# Patient Record
Sex: Male | Born: 1935
Health system: Southern US, Community
[De-identification: ages and names within clinical notes are randomized; demographics above are authoritative.]

## PROBLEM LIST (undated history)

## (undated) DIAGNOSIS — Z87820 Personal history of traumatic brain injury: Secondary | ICD-10-CM

## (undated) DIAGNOSIS — Z87898 Personal history of other specified conditions: Secondary | ICD-10-CM

## (undated) DIAGNOSIS — G9341 Metabolic encephalopathy: Secondary | ICD-10-CM

## (undated) DIAGNOSIS — N401 Enlarged prostate with lower urinary tract symptoms: Secondary | ICD-10-CM

## (undated) DIAGNOSIS — Z87438 Personal history of other diseases of male genital organs: Secondary | ICD-10-CM

## (undated) DIAGNOSIS — N400 Enlarged prostate without lower urinary tract symptoms: Secondary | ICD-10-CM

## (undated) DIAGNOSIS — R339 Retention of urine, unspecified: Secondary | ICD-10-CM

## (undated) DIAGNOSIS — E785 Hyperlipidemia, unspecified: Secondary | ICD-10-CM

## (undated) DIAGNOSIS — E119 Type 2 diabetes mellitus without complications: Secondary | ICD-10-CM

## (undated) DIAGNOSIS — Z8619 Personal history of other infectious and parasitic diseases: Secondary | ICD-10-CM

## (undated) DIAGNOSIS — N138 Other obstructive and reflux uropathy: Secondary | ICD-10-CM

## (undated) DIAGNOSIS — K219 Gastro-esophageal reflux disease without esophagitis: Secondary | ICD-10-CM

## (undated) DIAGNOSIS — G919 Hydrocephalus, unspecified: Secondary | ICD-10-CM

## (undated) DIAGNOSIS — I10 Essential (primary) hypertension: Secondary | ICD-10-CM

## (undated) DIAGNOSIS — M8580 Other specified disorders of bone density and structure, unspecified site: Secondary | ICD-10-CM

## (undated) DIAGNOSIS — R42 Dizziness and giddiness: Secondary | ICD-10-CM

## (undated) DIAGNOSIS — D099 Carcinoma in situ, unspecified: Secondary | ICD-10-CM

## (undated) DIAGNOSIS — E559 Vitamin D deficiency, unspecified: Secondary | ICD-10-CM

## (undated) DIAGNOSIS — Z960 Presence of urogenital implants: Secondary | ICD-10-CM

## (undated) DIAGNOSIS — Z972 Presence of dental prosthetic device (complete) (partial): Secondary | ICD-10-CM

## (undated) DIAGNOSIS — R413 Other amnesia: Secondary | ICD-10-CM

## (undated) DIAGNOSIS — M1990 Unspecified osteoarthritis, unspecified site: Secondary | ICD-10-CM

## (undated) DIAGNOSIS — C61 Malignant neoplasm of prostate: Secondary | ICD-10-CM

## (undated) DIAGNOSIS — R269 Unspecified abnormalities of gait and mobility: Secondary | ICD-10-CM

## (undated) DIAGNOSIS — H269 Unspecified cataract: Secondary | ICD-10-CM

## (undated) DIAGNOSIS — J3800 Paralysis of vocal cords and larynx, unspecified: Secondary | ICD-10-CM

## (undated) HISTORY — PX: SQUAMOUS CELL CARCINOMA EXCISION: SHX2433

## (undated) HISTORY — PX: OTHER SURGICAL HISTORY: SHX169

## (undated) HISTORY — DX: Other amnesia: R41.3

## (undated) HISTORY — DX: Paralysis of vocal cords and larynx, unspecified: J38.00

## (undated) HISTORY — PX: INGUINAL HERNIA REPAIR: SUR1180

## (undated) HISTORY — DX: Benign prostatic hyperplasia without lower urinary tract symptoms: N40.0

## (undated) HISTORY — DX: Unspecified abnormalities of gait and mobility: R26.9

## (undated) HISTORY — DX: Hypercalcemia: E83.52

## (undated) HISTORY — PX: MIDDLE EAR SURGERY: SHX713

## (undated) HISTORY — DX: Vitamin D deficiency, unspecified: E55.9

## (undated) HISTORY — PX: KNEE ARTHROSCOPY: SUR90

## (undated) HISTORY — PX: TRANSURETHRAL RESECTION OF PROSTATE: SHX73

## (undated) HISTORY — DX: Other specified disorders of bone density and structure, unspecified site: M85.80

## (undated) HISTORY — DX: Metabolic encephalopathy: G93.41

## (undated) HISTORY — DX: Hyperlipidemia, unspecified: E78.5

## (undated) HISTORY — DX: Unspecified cataract: H26.9

---
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
## 2000-07-14 ENCOUNTER — Emergency Department (HOSPITAL_COMMUNITY): Admission: EM | Admit: 2000-07-14 | Discharge: 2000-07-14 | Payer: Self-pay | Admitting: *Deleted

## 2000-07-20 ENCOUNTER — Inpatient Hospital Stay (HOSPITAL_COMMUNITY): Admission: EM | Admit: 2000-07-20 | Discharge: 2000-07-24 | Payer: Self-pay | Admitting: Urology

## 2000-07-20 ENCOUNTER — Encounter: Payer: Self-pay | Admitting: Urology

## 2000-07-26 ENCOUNTER — Inpatient Hospital Stay (HOSPITAL_COMMUNITY): Admission: RE | Admit: 2000-07-26 | Discharge: 2000-07-28 | Payer: Self-pay | Admitting: Urology

## 2000-07-26 ENCOUNTER — Encounter (INDEPENDENT_AMBULATORY_CARE_PROVIDER_SITE_OTHER): Payer: Self-pay | Admitting: Specialist

## 2001-02-02 ENCOUNTER — Ambulatory Visit (HOSPITAL_COMMUNITY): Admission: RE | Admit: 2001-02-02 | Discharge: 2001-02-02 | Payer: Self-pay | Admitting: Gastroenterology

## 2001-09-11 ENCOUNTER — Encounter: Admission: RE | Admit: 2001-09-11 | Discharge: 2001-09-11 | Payer: Self-pay | Admitting: Orthopedic Surgery

## 2001-09-11 ENCOUNTER — Encounter: Payer: Self-pay | Admitting: Orthopedic Surgery

## 2001-09-12 ENCOUNTER — Ambulatory Visit (HOSPITAL_BASED_OUTPATIENT_CLINIC_OR_DEPARTMENT_OTHER): Admission: RE | Admit: 2001-09-12 | Discharge: 2001-09-12 | Payer: Self-pay | Admitting: Orthopedic Surgery

## 2001-09-12 ENCOUNTER — Encounter (INDEPENDENT_AMBULATORY_CARE_PROVIDER_SITE_OTHER): Payer: Self-pay | Admitting: *Deleted

## 2002-05-16 ENCOUNTER — Encounter (INDEPENDENT_AMBULATORY_CARE_PROVIDER_SITE_OTHER): Payer: Self-pay

## 2002-05-16 ENCOUNTER — Ambulatory Visit (HOSPITAL_COMMUNITY): Admission: RE | Admit: 2002-05-16 | Discharge: 2002-05-16 | Payer: Self-pay | Admitting: Gastroenterology

## 2004-08-19 ENCOUNTER — Other Ambulatory Visit: Payer: Self-pay

## 2004-08-24 ENCOUNTER — Ambulatory Visit: Payer: Self-pay | Admitting: Unknown Physician Specialty

## 2005-12-29 ENCOUNTER — Other Ambulatory Visit: Payer: Self-pay

## 2005-12-29 ENCOUNTER — Ambulatory Visit: Payer: Self-pay | Admitting: Unknown Physician Specialty

## 2006-01-03 ENCOUNTER — Ambulatory Visit: Payer: Self-pay | Admitting: Unknown Physician Specialty

## 2007-08-12 ENCOUNTER — Emergency Department (HOSPITAL_COMMUNITY): Admission: EM | Admit: 2007-08-12 | Discharge: 2007-08-12 | Payer: Self-pay | Admitting: Emergency Medicine

## 2007-11-03 ENCOUNTER — Encounter: Admission: RE | Admit: 2007-11-03 | Discharge: 2007-11-03 | Payer: Self-pay | Admitting: General Surgery

## 2007-11-07 ENCOUNTER — Ambulatory Visit (HOSPITAL_BASED_OUTPATIENT_CLINIC_OR_DEPARTMENT_OTHER): Admission: RE | Admit: 2007-11-07 | Discharge: 2007-11-07 | Payer: Self-pay | Admitting: General Surgery

## 2009-09-27 ENCOUNTER — Emergency Department (HOSPITAL_COMMUNITY): Admission: EM | Admit: 2009-09-27 | Discharge: 2009-09-27 | Payer: Self-pay | Admitting: Emergency Medicine

## 2011-02-24 LAB — POCT I-STAT, CHEM 8
BUN: 24 mg/dL — ABNORMAL HIGH (ref 6–23)
Calcium, Ion: 1.1 mmol/L — ABNORMAL LOW (ref 1.12–1.32)
Chloride: 105 mEq/L (ref 96–112)
Creatinine, Ser: 1 mg/dL (ref 0.4–1.5)
Glucose, Bld: 130 mg/dL — ABNORMAL HIGH (ref 70–99)
HCT: 49 % (ref 39.0–52.0)
Hemoglobin: 16.7 g/dL (ref 13.0–17.0)
Potassium: 3.7 mEq/L (ref 3.5–5.1)
Sodium: 138 mEq/L (ref 135–145)
TCO2: 24 mmol/L (ref 0–100)

## 2011-02-24 LAB — GLUCOSE, CAPILLARY: Glucose-Capillary: 125 mg/dL — ABNORMAL HIGH (ref 70–99)

## 2011-04-06 NOTE — Op Note (Signed)
NAME:  GARVIN, ELLENA NO.:  [id number]   MEDICAL RECORD NO.:  [id number]          PATIENT TYPE:  AMB   LOCATION:  [facility]                         FACILITY:  [facility]   PHYSICIAN:  Angelia Mould. Derrell Lolling, M.D.DATE OF BIRTH:  [birth year]   DATE OF PROCEDURE:  11/07/2007  DATE OF DISCHARGE:                               OPERATIVE REPORT   PREOPERATIVE DIAGNOSIS:  Right inguinal hernia.   POSTOPERATIVE DIAGNOSIS:  Right inguinal hernia.   OPERATION PERFORMED:  Repair right inguinal hernia with mesh  Armanda [employer] repair).   SURGEON:  Angelia Mould. Derrell Lolling, M.D.   OPERATIVE INDICATIONS:  This is a 77 year old white male who has noticed  a bulge and some pain in his right groin for a couple of months.  On  exam, he is found to have a reducible right inguinal hernia.  He wanted  to have this repaired because of his symptoms.  He is brought to the  operating room electively.   OPERATIVE FINDINGS:  The patient had an indirect and a direct right  inguinal hernia.  The indirect hernia was a simple hernia, and there  were no adhesions or sliding component.  The direct component was not  that large.   OPERATIVE TECHNIQUE:  Following induction of general endotracheal  anesthesia, the patient's lower abdomen, right groin, and genitalia were  prepped and draped in a sterile fashion.  Intravenous antibiotics were  given.  The patient was identified as the correct patient, correct  procedure and correct site.  Marcaine 0.25% with epinephrine was used as  a local infiltration anesthetic.  An oblique incision was made in the  right groin overlying the inguinal canal.  Dissection was carried down  through subcutaneous tissue exposing the aponeurosis of the external  oblique.  The external oblique was incised in the direction of its  fibers, opening up the external inguinal ring.  Self-retaining  retractors were placed.  The cord structures were mobilized and circled  with a Penrose drain.  I  dissected a small lipoma away from the cord  structures and discarded that.  I dissected an indirect hernia sac away  from the cord structures and freed it up all the way back to the level  of the internal ring.  I opened the sac inspected it.  Some small bowel  had been present but was reduced.  There were no adhesions.  I felt no  abnormalities within the peritoneal cavity.  The indirect sac was then  twisted and suture ligated at the level the internal ring with suture  ligature of 2-0 silk.  Excess sac was excised and discarded.  The direct  component of the hernia was not that large and did not require specific  suture reduction.  Repair of the floor of the inguinal canal was  performed using a 3-inch x 6-inch piece of Ultrapro mesh.  The mesh was  trimmed at the corners to fit the wound.  The mesh was sutured in place  with running sutures and interrupted sutures of 2-0 Prolene.  The mesh  was sutured so as to generously overlap the  fascia at the pubic  tubercle, then along the inguinal ligament inferiorly.  Medially and  superior medially, the mesh was secured with multiple interrupted  mattress sutures of 2-0 Prolene.  Superolaterally, a running suture of 2-  0 Prolene was used.  The mesh was incised laterally so as to wrap around  the cord structures at the internal ring.  The suture lines were  completed laterally.  I placed one other suture of Prolene laterally to  tighten up the cord.  This provided a good repair both medial and  lateral to the cord structures but allowed a fingertip opening for the  cord structures to come through.  Hemostasis was excellent.  The wound  was irrigated with saline.  The external oblique was closed with running  suture of 2-0 Vicryl, placing the cord structures deep to the external  oblique.  Scarpa's fascia was  closed with 3-0 Vicryl sutures and the skin closed with a running  subcuticular suture of 4-0 Monocryl and Dermabond.  Clean bandages  were  placed and the patient taken to the recovery room in stable condition.  Estimated blood loss was about 10 mL.  Complications none. Sponge,  needle and instrument counts were correct.      Angelia Mould. Derrell Lolling, M.D.  Electronically Signed     HMI/MEDQ  D:  11/07/2007  T:  11/07/2007  Job:  [id number]

## 2011-04-09 NOTE — H&P (Signed)
[facility]  Patient:    Robert Gross, Robert Gross                      MRN: [id number] Adm. Date:  [id number] Attending:  Londell Moh                         History and Physical  ADMITTING DIAGNOSES 1. Urinary retention. 2. Prostatitis.  HISTORY:  This 77 year old male was in his usual state of health until he developed problems with urinary retention.  He failed a trial of Flomax. Patient then began to develop a high fever and was found to have prostatitis. He was admitted to the hospital on July 20, 2000 and started on antibiotic therapy and the patient rapidly had normalization of his white count but still stayed on a voiding trial.  Patient is interested in having something done permanently for his prostate and is now admitted for TURP.  PAST MEDICAL HISTORY:  Past medical history is remarkable for hypertension for which he takes Prinivil.  He is also on an over-the-counter glucosamine for chronic arthritis.  PAST SURGICAL HISTORY:  The patients previous surgery is hemorrhoid surgery as well as old arthroscopic knee surgeries.  SOCIAL HISTORY:  The patients social history is unremarkable.  He works as a Naval architect.  He does not use tobacco or alcohol.  REVIEW OF SYSTEMS:  Noncontributory.  The patient has lower urinary tract symptoms as well as arthritis.  He does have a past history of basal cell carcinoma which has been successfully treated.  PHYSICAL EXAMINATION  GENERAL:  The patient is a well-developed, well-nourished male in no acute distress.  He no longer has the positive symptoms that required admission last week.  HEENT:  Normocephalic, atraumatic.  Cranial nerves II-XII appear grossly intact.  NECK:  Supple with no adenopathy or thyromegaly.  LUNGS:  Clear.  HEART:  Regular rate and rhythm, with no murmurs, thrills, gallops, rubs or heaves.  ABDOMEN:  Soft and nontender.  No palpable masses, rebound or  guarding.  RECTAL:  The prostate is 3 to 4+ in size, smooth and non-nodular and no longer boggy.  GU:  The testicles are of normal size, shape and consistency.  Cord structures are normal.  No hydrocele, spermatocele, varicocele, hernia or adenopathy.  IMPRESSION:  Benign prostatic hypertrophy with associated retention.  PLAN:  The plan is to admit following his TURP. DD:  07/26/00 TD:  07/27/00 Job: [id number]

## 2011-04-09 NOTE — Procedures (Signed)
[facility]  Patient:    Robert Gross, Robert Gross Visit Number: [id number] MRN: [id number]          Service Type: END Location: ENDO Attending Physician:  Dennison Bulla Ii Dictated by:   Verlin Grills, M.D. Proc. Date: 05/16/02 Admit Date:  05/16/2002 Discharge Date: 05/16/2002                             Procedure Report  PROCEDURE:  Esophagogastroduodenoscopy with duodenal bulb biopsy.  PROCEDURE INDICATION:  [name] is a 77 year old male, born Dec 08, 1928.  For approximately two months, [name] has had intermittent, predominantly postprandial, right upper quadrant discomfort leading to a pressure retrosternal discomfort relieved with belching and hiccups.  He denies dysphagia or odynophagia.  Occasionally his symptoms are associated with nausea and vomiting.  His symptoms develop acutely and unpredictably. They usually occur after his evening meal.  CHRONIC MEDICATIONS:  Prinzide, aspirin, Advil, glucosamine with chondroitin sulfate.  PAST MEDICAL HISTORY: 1. Hypertension. 2. Hyperlipidemia. 3. Benign prostatic hypertrophy. 4. Colonic diverticulosis by colonoscopy. 5. Remote transurethral resection of the prostate. 6. Arthroscopic knee surgery.  MEDICATION ALLERGIES:  None.  [name] was placed on Prevacid 30 mg before his evening meal on Apr 11, 2002.  Since starting Prevacid, he has had no gastrointestinal symptoms.  ENDOSCOPIST:  Verlin Grills, M.D.  PREMEDICATION:  Versed 5 mg.  ENDOSCOPE:  Olympus gastroscope.  DESCRIPTION OF PROCEDURE:  After obtaining informed consent, [name] was placed in the left lateral decubitus position.  I administered intravenous Versed to achieve conscious sedation for the procedure.  The patients blood pressure, oxygen saturation, and cardiac rhythm were monitored throughout the procedure and documented in the medical record.  The Olympus gastroscope was passed  through the posterior hypopharynx into the proximal esophagus without difficulty.  The hypopharynx, larynx, and vocal cords appeared normal.  Esophagoscopy:  The proximal, mid, and lower segments of the esophagus appeared normal.  The squamocolumnar junction is noted at 35 cm from the incisor teeth.  Endoscopically, there is no evidence for the presence of erosive esophagitis, esophageal mucosal scarring, Barretts esophagus, or esophageal ulceration.  Gastroscopy:  [name] has a large hiatal hernia and quite patulous diaphragmatic hiatus.  Retroflexed view of the gastric cardia and fundus was normal.  The gastric body, antrum, and pylorus appeared normal.  Duodenoscopy:  Mucosa in the duodenal bulb has a bumpy pattern consistent with lymphoid hyperplasia.  Multiple biopsies were taken to rule out neoplastic tissue.  There is no mucosal friability.  The descending duodenum appears normal.  ASSESSMENT: 1. Large hiatal hernia. 2. Bumpy-appearing mucosa in the duodenal bulb; differential diagnosis would    include a lymphoid hyperplasia versus neoplastic tissue.  Biopsies are    pending.  PLAN:  I suspect that [name] gastrointestinal symptoms are related to gastroesophageal reflux, and I will continue his proton pump inhibitor therapy. Dictated by:   Verlin Grills, M.D. Attending Physician:  Dennison Bulla Ii DD:  05/16/02 TD:  05/17/02 Job: [id number]

## 2011-04-09 NOTE — Discharge Summary (Signed)
[facility]  Patient:    Robert Gross, Robert Gross                      MRN: [id number] Adm. Date:  [id number] Disc. Date: [id number] Attending:  Londell Moh                           Discharge Summary  ADMISSION DIAGNOSIS:  Benign prostatic hypertrophy with urinary retention.  SECONDARY DIAGNOSES: 1. Prostatitis. 2. Hypertension.  PRINCIPAL PROCEDURE:  TURP.  HISTORY OF PRESENT ILLNESS:  This 77 year old male has had bladder symptoms for some time.  He developed urinary retention and failed a trial of Flomax. The patient developed a high fever and was admitted to the hospital for treatment of a presumed prostatitis.  The patient felt better but requested that a TURP be performed as he had failed another voiding trial.  He was discharged from the hospital temporarily and now returns for his TURP.  PAST MEDICAL HISTORY:  Hypertension, for which he takes Prinivil.  The patient is also on over-the-counter glucosamine for chronic arthritis.  Previous surgery is hemorrhoid surgery and arthroscopic knee surgery.  SOCIAL HISTORY:  The patient is a Naval architect.  He does not use tobacco or alcohol.  REVIEW OF SYSTEMS, PHYSICAL EXAMINATION:  Well described in the patients history and physical as well as his most recent discharge summary.  HOSPITAL COURSE:  The patient was taken to the operating room on July 26, 2000, where he underwent successful TURP.  His postoperative course was unremarkable.  He was rapidly advanced to a regular diet.  He was afebrile. He had no trouble voiding following the removal of the catheter.  The patient was discharged on postoperative day #2, was sent home with antibiotic therapy, and will return in follow-up in approximately two weeks time. DD:  08/17/00 TD:  08/18/00 Job: [id number]

## 2011-04-09 NOTE — Op Note (Signed)
[facility]  Patient:    HART, HAAS                      MRN: [id number] Proc. Date: 07/26/00 Adm. Date:  [id number] Attending:  Londell Moh                           Operative Report  SERVICE:  UROLOGY.  PREOPERATIVE DIAGNOSES:  Urinary retention. Prostatitis.  POSTOPERATIVE DIAGNOSES:  Urinary retention. Prostatitis.  PROCEDURE:  Transurethral resection of prostate.  SURGEON:  Dr. Logan Bores.  ANESTHESIA:  General.  COMPLICATIONS:  None.  DRAINS:  60 French Foley catheter 3-way bladder irrigation.  BRIEF HISTORY:  This 77 year old male was in his usual state of health when he developed problems with retention and what turned out to be a severe case of prostatitis. The patient failed several trials at voiding despite the use of Flomax. He was admitted to the hospital last week with an elevated temperature and white counts of over 21,000. The patient was given antibiotic therapy, his white count returned to normal. He failed several voiding trials and was sent home over this past week to return today for a TURP. The patient was given the option of trying to wait and see if further antibiotic therapy and Flomax would help. The patient has been unhappy with his voiding and feels he would like to go and have a permanent solution. The patient understands the risks and benefits of the procedure including the risks for incontinence. He gave full and informed consent.  DESCRIPTION OF PROCEDURE:  After successful induction of general anesthesia, the patient was placed in the dorsal lithotomy position and prepped with Betadine and draped in the usual sterile fashion. The urethra was calibrated at 30 [place] with [employer] sounds and the Olympus continuous flow resectoscope sheath was then inserted using a Timberlake obturator. The ______  was then inserted with the 12 degree lens in place. The bladder was carefully inspected and moderate  trabeculation was seen. There was lateral lobe hypertrophy as well as some median lobe hypertrophy. The lateral lobes met in the midline. The tissue was taken down at the bladder neck beginning at the bladder neck and extending up to the verumontanum. This was followed by resection of the right lateral lobe starting at the 11 oclock position and extending down to the floor of the prostate. The left lateral lobe was resected in identical fashion. The anterior lobe tissue was resected and anterior tissue across the floor and towards the apex was also removed. Care was taken to avoid injury to the verumontanum or to the sphincter mechanism. Rectal examination showed most of the tissue had been resected and additional inspection showed that the ______ had been reached in most areas and that the prostatic fossa was wide open. The bladder neck had not been undermined. Adequate hemostasis was obtained. All chips were irrigated from the bladder. A 24 French 3-way Foley catheter was inserted and irrigation was initiated with good flow of clear outflow. The patient was given ______ and was taken to the recovery room in good condition. DD:  07/26/00 TD:  07/27/00 Job: [id number]

## 2011-04-09 NOTE — Discharge Summary (Signed)
[facility]  Patient:    Robert Gross, Robert Gross                      MRN: [id number] Adm. Date:  [id number] Disc. Date: [id number] Attending:  Londell Moh                           Discharge Summary  ADMISSION DIAGNOSES: 1. Prostatitis. 2. Hypovolemia. 3. Urinary retention. 4. Hypertension.  PROCEDURES:  None.  HISTORY:  This 77 year old male had mild to moderate bladder outlet obstruction.  As this became more severe, he was seen in the office by Dr. Isabel Caprice, had Foley catheter inserted.  The patient was started on Alpha blockade and failed two attempts at removal of the Foley catheter.  The patient was seen by me on July 20, 2000, in Dr. Noralee Chars absence.  He was noted to be acutely toxic.  He had a high temperature along with fever, chills, and rigors.  The patient was also thought to be dehydrated as he was unable to keep any fluids down.  He was admitted for IV fluids and antibiotic therapy for his prostatitis.  PAST MEDICAL HISTORY:  Otherwise remarkable.  MEDICATIONS:  None except for Prinivil for hypertension.  He also uses over-the-counter glucosamine for chronic arthritis.  The patient has had no previous surgery other than hemorrhoid surgery and arthroscopic knee surgery.  PHYSICAL EXAMINATION:  As described in initial History & Physical and pertinent for his high temperature as well as very tender 2 to 3+ benign feeling prostate.  ADMISSION LABORATORY DATA:  The patients initial laboratory studies showed a markedly elevated white count 27.2.  His metabolic panel was unremarkable.  Chest x-ray and EKG were normal.  HOSPITAL COURSE:  The patient was started IV and antibiotic therapy, and a Foley catheter was left to straight drainage.  He had a nice decrease in his temperature on antibiotic therapy, and his white count dropped down to normal. He was felt to be ready for discharge by July 24, 2000.  At that point, he had a normal  white count.  He was on Cipro, and he did know that he would require a TURP.  He was given the option of staying in the hospital until the TURP could be performed.  He elected to go home for one or two days to spend time with his family.  He will return to the office next week for TURP. DD:  08/05/00 TD:  08/08/00 Job: [id number]

## 2011-04-09 NOTE — Op Note (Signed)
[facility]  Patient:    Robert Gross, Robert Gross Visit Number: [id number] MRN: [id number]          Service Type: DSU Location: [facility] Attending Physician:  Susa Day Dictated by:   Katy Fitch Naaman Plummer., M.D. Proc. Date: 09/12/01 Admit Date:  09/12/2001                             Operative Report  PREOPERATIVE DIAGNOSIS:  Enlarging mass ulnar aspect of left thumb pulp consistent with epidermal inclusion cyst.  POSTOPERATIVE DIAGNOSIS:  Enlarging mass ulnar aspect of left thumb pulp consistent with epidermal inclusion cyst.  OPERATION PERFORMED:  Excision of epidermal inclusion cyst, left thumb.  SURGEON:  Katy Fitch. Sypher, Montez Hageman., M.D.  ASSISTANT:  Jonni Sanger, P.A.  ANESTHESIA:  0.25% Marcaine and 2% lidocaine metacarpal head level block of left thumb supplemented by IV sedation.  SUPERVISING ANESTHESIOLOGIST:  Dr. Gypsy Balsam.  INDICATIONS FOR PROCEDURE:  [name] is a 77 year old established patient with our office who presented for evaluation and management of a mass on the ulnar aspect of his left thumb pulp.  This measured approximately 1.2 x 1.0 cm.  This was causing difficulty with pinch prehension.  He could remotely recall a penetrating injury from a splinter in this region.  Clinical examination suggested an epidermal inclusion cyst.  We recommended excisional biopsy for diagnosis and hopeful resolution of this predicament.  DESCRIPTION OF PROCEDURE:  Samik Balkcom was brought to the operating room and placed in supine position on the operating table.  Following light sedation, the left arm was prepped with Betadine followed by placement of a 0.25% Marcaine and 2% lidocaine metacarpal head level block.  When anesthesia was satisfactory, the arm was scrubbed with Betadine soap and solution and sterilely draped.  Following exsanguination of the limb with an Esmarch bandage, the arterial tourniquet on the proximal  brachium was inflated to 240 mmHg.  The procedure commenced with excision of an elliptical piece of skin directly over the cyst to correct the skin expansion created the enlarging cyst.  The mass was circumferentially dissected from a subfascial position.  This was abutting directly against the tuft of this phalanx.  The neurovascular structures were gently dissected with small scissors and forceps.  A Glorious Peach was used to remove this from the periosteum.  The wound was subsequently irrigated and abraded with saline soaked sponge.  The skin was then repaired with interrupted sutures of 5-0 nylon.  The specimen was passed on in Formalin for pathologic evaluation.  There were no apparent complications.  Mr. Bommarito wound was dressed with Xeroform, sterile gauze and Coban. Tourniquet was released with immediate capillary refill to the fingers and thumb.  The patient was transferred to the recovery room with stable vital signs.  He will be discharged with a prescription for Tylenol #3 with codeine 16 tablets 1 to 2 tablets p.o. q.4-6h. p.r.n. pain.  He will return to our office in follow-up evaluation in 7 to 10 days for suture removal. Dictated by:   Katy Fitch. Naaman Plummer., M.D. Attending Physician:  Susa Day DD:  09/12/01 TD:  09/12/01 Job: [phone]

## 2011-04-09 NOTE — H&P (Signed)
[facility]  Patient:    Robert Gross, Robert Gross                   MRN: [id number] Proc. Date: 07/20/00 Adm. Date:  [id number] Attending:  Londell Moh CC:         Dr. Prentiss Bells   History and Physical  SERVICE:  Urology.  ADMITTING DIAGNOSES 1. Urinary retention. 2. Prostatitis. 3. Hypertension.  HISTORY:  This 77 year old male has had mild-to-moderate symptoms of bladder outlet obstruction and was not bothered by these until recently.  The patient was seen in the office by Dr. Barron Alvine, and because he had urinary retention, had a Foley catheter inserted.  The patient has had voiding trials x 2 and have been unsuccessful.  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  The patient was noted to be acutely toxic in the office yesterday.  He had fevers, chills, rigors and had a difficult time eating or drinking.  It was felt that the patient developed prostatitis, with secondary dehydration, and it was felt that he ought to be admitted for further management.  PAST MEDICAL HISTORY:  Patients past medical history is unremarkable.  MEDICATIONS:  He takes Prinivil for hypertension.  He takes over-the-counter glucosamine for some problems with chronic arthritis.  PAST SURGICAL HISTORY:  The patient has had no significant surgery other than some outpatient hemorrhoid surgery as well as arthroscopic knee surgery for arthritis.  SOCIAL HISTORY:  Unremarkable.  He is married.  He still works as a Naval architect.  He does not use tobacco or alcohol.  REVIEW OF SYSTEMS:  Review of systems was also noncontributory.  Aside from the problems the patient has had with his lower urinary tract and his arthritis, he has no chronic conditions.  He is noted to have a past history of basal cell carcinoma on the face which has been successfully treated.  PHYSICAL EXAMINATION  VITAL SIGNS:  On examination today, the patient  has a temperature of 101.6, pulse 82, respirations 18, blood pressure 130/70.  GENERAL:  He is suffering from chills and rigors at the time of admission.  HEENT:  Normocephalic, atraumatic.  Cranial nerves II-XII grossly intact.  NECK:  Supple with no adenopathy or thyromegaly.  LUNGS:  Clear.  HEART:  Regular rate and rhythm.  No murmurs, thrills or gallops.  ABDOMEN:  Soft.  There is some slight tenderness just above the suprapubic area.  There is no true flank mass or tenderness.  GENITALIA:  Normal.  Testicles are normal in size, shape and consistency. Cord structures are normal.  No hydrocephalus, spermatocele, varicocele, hernia or adenopathy.  RECTAL:  Examination shows a very tender prostate, 2 to 3+ in size, and slightly boggy.  EXTREMITIES:  Unremarkable.  IMPRESSION 1. Dehydration. 2. Urinary retention. 3. Prostatitis.  PLAN:  Admit for IV antibiotic therapy and Foley catheter drainage.  DD: 07/21/00 TD:  07/21/00 Job: [id number]

## 2011-08-30 LAB — COMPREHENSIVE METABOLIC PANEL
Alkaline Phosphatase: 83
BUN: 14
CO2: 28
Chloride: 102
Creatinine, Ser: 1
GFR calc non Af Amer: >60
Glucose, Bld: 164 — ABNORMAL HIGH
Potassium: 5.6 — ABNORMAL HIGH
Total Bilirubin: 0.9

## 2011-08-30 LAB — DIFFERENTIAL
Basophils Absolute: 0
Basophils Relative: 0
Lymphocytes Relative: 22
Monocytes Absolute: 0.7
Neutro Abs: 4.6
Neutrophils Relative %: 62

## 2011-08-30 LAB — CBC
MCV: 96.3
RBC: 4.79

## 2011-08-30 LAB — URINALYSIS, ROUTINE W REFLEX MICROSCOPIC
Bilirubin Urine: NEGATIVE
Nitrite: NEGATIVE
Specific Gravity, Urine: 1.014
Urobilinogen, UA: 0.2
pH: 5.5

## 2011-09-28 ENCOUNTER — Other Ambulatory Visit: Payer: Self-pay | Admitting: Gastroenterology

## 2012-01-06 ENCOUNTER — Other Ambulatory Visit: Payer: Self-pay | Admitting: Dermatology

## 2012-10-13 ENCOUNTER — Other Ambulatory Visit: Payer: Self-pay | Admitting: Dermatology

## 2012-11-08 ENCOUNTER — Other Ambulatory Visit: Payer: Self-pay | Admitting: Dermatology

## 2012-12-06 ENCOUNTER — Other Ambulatory Visit: Payer: Self-pay | Admitting: Dermatology

## 2013-06-18 ENCOUNTER — Other Ambulatory Visit: Payer: Self-pay | Admitting: Dermatology

## 2013-11-19 ENCOUNTER — Other Ambulatory Visit: Payer: Self-pay | Admitting: Dermatology

## 2014-01-09 ENCOUNTER — Other Ambulatory Visit: Payer: Self-pay | Admitting: Dermatology

## 2014-03-04 ENCOUNTER — Other Ambulatory Visit: Payer: Self-pay | Admitting: Dermatology

## 2014-07-16 ENCOUNTER — Other Ambulatory Visit: Payer: Self-pay | Admitting: Dermatology

## 2015-01-08 ENCOUNTER — Other Ambulatory Visit: Payer: Self-pay | Admitting: Dermatology

## 2017-03-22 HISTORY — PX: CATARACT EXTRACTION W/ INTRAOCULAR LENS  IMPLANT, BILATERAL: SHX1307

## 2017-09-05 ENCOUNTER — Encounter (HOSPITAL_COMMUNITY): Payer: Self-pay | Admitting: *Deleted

## 2017-09-05 ENCOUNTER — Emergency Department (HOSPITAL_COMMUNITY)
Admission: EM | Admit: 2017-09-05 | Discharge: 2017-09-05 | Disposition: A | Discharge status: Home / Self Care | Payer: Medicare Other | Attending: Emergency Medicine | Admitting: Emergency Medicine

## 2017-09-05 ENCOUNTER — Emergency Department (HOSPITAL_COMMUNITY): Payer: Medicare Other

## 2017-09-05 DIAGNOSIS — Z79899 Other long term (current) drug therapy: Secondary | ICD-10-CM | POA: Diagnosis not present

## 2017-09-05 DIAGNOSIS — W11XXXA Fall on and from ladder, initial encounter: Secondary | ICD-10-CM | POA: Diagnosis not present

## 2017-09-05 DIAGNOSIS — Z7984 Long term (current) use of oral hypoglycemic drugs: Secondary | ICD-10-CM | POA: Diagnosis not present

## 2017-09-05 DIAGNOSIS — S0181XA Laceration without foreign body of other part of head, initial encounter: Secondary | ICD-10-CM | POA: Insufficient documentation

## 2017-09-05 DIAGNOSIS — Y939 Activity, unspecified: Secondary | ICD-10-CM | POA: Insufficient documentation

## 2017-09-05 DIAGNOSIS — E119 Type 2 diabetes mellitus without complications: Secondary | ICD-10-CM | POA: Diagnosis not present

## 2017-09-05 DIAGNOSIS — S0990XA Unspecified injury of head, initial encounter: Secondary | ICD-10-CM | POA: Diagnosis present

## 2017-09-05 DIAGNOSIS — Y9222 Religious institution as the place of occurrence of the external cause: Secondary | ICD-10-CM | POA: Insufficient documentation

## 2017-09-05 DIAGNOSIS — S0101XA Laceration without foreign body of scalp, initial encounter: Secondary | ICD-10-CM

## 2017-09-05 DIAGNOSIS — Y999 Unspecified external cause status: Secondary | ICD-10-CM | POA: Diagnosis not present

## 2017-09-05 DIAGNOSIS — W19XXXA Unspecified fall, initial encounter: Secondary | ICD-10-CM

## 2017-09-05 DIAGNOSIS — Z23 Encounter for immunization: Secondary | ICD-10-CM | POA: Insufficient documentation

## 2017-09-05 LAB — BASIC METABOLIC PANEL
Anion gap: 11 (ref 5–15)
BUN: 21 mg/dL — AB (ref 6–20)
CALCIUM: 10.1 mg/dL (ref 8.9–10.3)
CHLORIDE: 108 mmol/L (ref 101–111)
CO2: 21 mmol/L — ABNORMAL LOW (ref 22–32)
CREATININE: 1.43 mg/dL — AB (ref 0.61–1.24)
GFR, EST AFRICAN AMERICAN: 50 mL/min — AB (ref >60)
GFR, EST NON AFRICAN AMERICAN: 43 mL/min — AB (ref >60)
Glucose, Bld: 157 mg/dL — ABNORMAL HIGH (ref 65–99)
Potassium: 3.8 mmol/L (ref 3.5–5.1)
SODIUM: 140 mmol/L (ref 135–145)

## 2017-09-05 LAB — MAGNESIUM: MAGNESIUM: 1.5 mg/dL — AB (ref 1.7–2.4)

## 2017-09-05 LAB — CBC
HCT: 41.4 % (ref 39.0–52.0)
HEMOGLOBIN: 13.5 g/dL (ref 13.0–17.0)
MCH: 32.1 pg (ref 26.0–34.0)
MCHC: 32.6 g/dL (ref 30.0–36.0)
MCV: 98.3 fL (ref 78.0–100.0)
PLATELETS: 180 10*3/uL (ref 150–400)
RBC: 4.21 MIL/uL — ABNORMAL LOW (ref 4.22–5.81)
RDW: 12.9 % (ref 11.5–15.5)
WBC: 10.5 10*3/uL (ref 4.0–10.5)

## 2017-09-05 LAB — TROPONIN I

## 2017-09-05 MED ORDER — MAGNESIUM OXIDE 400 (241.3 MG) MG PO TABS
400.0000 mg | ORAL_TABLET | Freq: Once | ORAL | Status: AC
  Administered 2017-09-05: 400 mg via ORAL
  Filled 2017-09-05: qty 1

## 2017-09-05 MED ORDER — BACITRACIN ZINC 500 UNIT/GM EX OINT
TOPICAL_OINTMENT | Freq: Once | CUTANEOUS | Status: AC
  Administered 2017-09-05: 1 via TOPICAL

## 2017-09-05 MED ORDER — LIDOCAINE-EPINEPHRINE (PF) 2 %-1:200000 IJ SOLN
10.0000 mL | Freq: Once | INTRAMUSCULAR | Status: AC
  Administered 2017-09-05: 10 mL
  Filled 2017-09-05: qty 20

## 2017-09-05 MED ORDER — TETANUS-DIPHTH-ACELL PERTUSSIS 5-2.5-18.5 LF-MCG/0.5 IM SUSP
0.5000 mL | Freq: Once | INTRAMUSCULAR | Status: AC
  Administered 2017-09-05: 0.5 mL via INTRAMUSCULAR
  Filled 2017-09-05: qty 0.5

## 2017-09-05 NOTE — ED Provider Notes (Signed)
[place] 3 [place] striking his head. He does not recall the fall. Paramedics report brief loss of consciousness after the fall. EMS treated patient with hard cervical collarOn exam patient is alert Glasgow Coma Score 15each ENT exam there is a laceration with call pulseless hematoma at right temporal area otherwise and was fact atraumatic neck supple nontender lungs clear compresses heart regular rate and rhythm abdomen nondistended nontender. Pelvis stable nontender all 4 extremity is a contusion abrasion or tenderness neurovascular intact. Neurologic Glasgow Coma Score 15 cranial nerves II through XII grossly intact. Moves all extremities well motor sent 5 over 5 overall   Orlie Dakin, MD 09/05/17 2048

## 2017-09-05 NOTE — ED Notes (Signed)
Pt ambulatory around room with steady gait.

## 2017-09-05 NOTE — ED Notes (Signed)
Pt taken to CT.

## 2017-09-05 NOTE — Discharge Instructions (Addendum)
Please follow up with the ear, nose, and throat doctor for the incidental parotid mass in addition to the possible vocal cord paralysis noted on today's imaging studies. Please see your primary care doctor in 3-5 days for reevaluation of your labwork and for suture removal. Return to the ED for worsening symptoms. You may take Tylenol and/or Motrin as needed for pain.

## 2017-09-05 NOTE — ED Provider Notes (Signed)
[facility] EMERGENCY DEPARTMENT Provider Note   CSN: [id number] Arrival date & time: 09/05/17  2036   History   Chief Complaint Chief Complaint  Patient presents with  . Fall   HPI Robert Gross is a 83 y.o. male.  The patient is an 83yo male with T2DM and no anti-coagulation who presents to the ED after a fall.  3 stairs +LOC Vomited once en route    Fall     Past Medical History:  Diagnosis Date  . Diabetes mellitus without complication ([place])     There are no active problems to display for this patient.   History reviewed. No pertinent surgical history.   Home Medications    Prior to Admission medications   Medication Sig Start Date End Date Taking? Authorizing Provider  atorvastatin (LIPITOR) 10 MG tablet Take 5 mg by mouth daily.   Yes [provider]  CALCIUM-MAGNESIUM-ZINC PO Take 1 tablet by mouth every morning.   Yes [provider]  cholecalciferol (VITAMIN D) 1000 units tablet Take 1,000 Units by mouth daily.   Yes [provider]  hydrochlorothiazide (HYDRODIURIL) 25 MG tablet Take 25 mg by mouth daily.   Yes [provider]  lisinopril (PRINIVIL,ZESTRIL) 40 MG tablet Take 40 mg by mouth daily.   Yes [provider]  metFORMIN (GLUCOPHAGE) 500 MG tablet Take 500-1,000 mg by mouth 2 (two) times daily with a meal. 1000 mg in the morning and 500 mg in the evening   Yes [provider]  pantoprazole (PROTONIX) 20 MG tablet Take 20 mg by mouth 2 (two) times daily.   Yes [provider]    Family History No family history on file.  Social History Social History  Substance Use Topics  . Smoking status: Never Smoker  . Smokeless tobacco: Never Used  . Alcohol use No     Allergies   Patient has no known allergies.   Review of Systems Review of Systems   Physical Exam Updated Vital Signs BP 126/73   Pulse (!) 101   Temp 98.1 F (36.7 C) (Temporal)   Resp  (!) 25   Ht 5\' 7"  (1.702 m)   Wt 63.5 kg (140 lb)   SpO2 93%   BMI 21.93 kg/m   Physical Exam   ED Treatments / Results  Labs (all labs ordered are listed, but only abnormal results are displayed) Labs Reviewed  CBC - Abnormal; Notable for the following:       Result Value   RBC 4.21 (*)    All other components within normal limits  BASIC METABOLIC PANEL - Abnormal; Notable for the following:    CO2 21 (*)    Glucose, Bld 157 (*)    BUN 21 (*)    Creatinine, Ser 1.43 (*)    GFR calc non Af Amer 43 (*)    GFR calc Af Amer 50 (*)    All other components within normal limits  MAGNESIUM - Abnormal; Notable for the following:    Magnesium 1.5 (*)    All other components within normal limits  TROPONIN I    EKG  EKG Interpretation None       Radiology Ct Head Wo Contrast  Result Date: 09/05/2017 CLINICAL DATA:  83 y/o M; status post fall with head injury and brief loss of consciousness. EXAM: CT HEAD WITHOUT CONTRAST CT CERVICAL SPINE WITHOUT CONTRAST TECHNIQUE: Multidetector CT imaging of the head and cervical spine was performed following the  standard protocol without intravenous contrast. Multiplanar CT image reconstructions of the cervical spine were also generated. COMPARISON:  None. FINDINGS: CT HEAD FINDINGS Brain: No evidence of acute infarction, hemorrhage, hydrocephalus, extra-axial collection or mass lesion/mass effect. Few nonspecific foci of hypoattenuation in subcortical white matter are compatible mild chronic microvascular ischemic changes. Mild brain parenchymal volume loss. Vascular: Calcific atherosclerosis of carotid siphons. No hyperdense vessel identified. Skull: Small right anterolateral frontal scalp contusion and laceration. Sinuses/Orbits: Small anterior ethmoid and left maxillary sinus mucous retention cyst. Partial opacification of left-greater-than-right mastoid air cells. Left wall up mastoidectomy. Bilateral intra-ocular lens replacements. Other:  None. CT CERVICAL SPINE FINDINGS Alignment: Straightening of cervical lordosis. Skull base and vertebrae: No acute fracture. No primary bone lesion or focal pathologic process. Soft tissues and spinal canal: No prevertebral fluid or swelling. No visible canal hematoma. Disc levels: Moderate cervical spondylosis with multilevel discogenic degenerative changes greatest at the C5-6 level with there is severe loss of disc space height. Mild facet arthrosis. No high-grade bony canal stenosis. Bones are demineralized. Upper chest: Negative. Other: Mass in the superficial lobe of right parotid gland measuring 15 x 11 x 21 mm with dorsal rim of calcification (AP x ML x CC series 8, image 33 and series 10, image 5). Moderate calcific atherosclerosis of carotid siphons. Mild dilatation of the left laryngeal ventricle and medial rotation of the arytenoid (series 8, image 81) may represent left-sided vocal cord paralysis. IMPRESSION: CT head: 1. No acute intracranial abnormality or calvarial fracture. 2. Right anterolateral frontal scalp small contusion and laceration. 3. Mild for age chronic microvascular ischemic changes and mild parenchymal volume loss of the brain. 4. Mild paranasal sinus disease. Partial bilateral mastoid air cell opacification. Left wall up mastoidectomy postsurgical changes. CT cervical spine: 1. No acute fracture or dislocation identified. 2. Moderate cervical spondylosis greatest at C5-6 level. No high-grade bony canal stenosis. 3. Mass in the superficial lobe of right parotid gland measuring up to 21 mm. CT or MRI of the neck with contrast is recommended to further evaluate on a nonemergent basis. 4. Findings suggesting left vocal cord paralysis, clinical correlation recommended. Electronically Signed   By: Kristine Garbe M.D.   On: 09/05/2017 21:52   Ct Cervical Spine Wo Contrast  Result Date: 09/05/2017 CLINICAL DATA:  83 y/o M; status post fall with head injury and brief loss of  consciousness. EXAM: CT HEAD WITHOUT CONTRAST CT CERVICAL SPINE WITHOUT CONTRAST TECHNIQUE: Multidetector CT imaging of the head and cervical spine was performed following the standard protocol without intravenous contrast. Multiplanar CT image reconstructions of the cervical spine were also generated. COMPARISON:  None. FINDINGS: CT HEAD FINDINGS Brain: No evidence of acute infarction, hemorrhage, hydrocephalus, extra-axial collection or mass lesion/mass effect. Few nonspecific foci of hypoattenuation in subcortical white matter are compatible mild chronic microvascular ischemic changes. Mild brain parenchymal volume loss. Vascular: Calcific atherosclerosis of carotid siphons. No hyperdense vessel identified. Skull: Small right anterolateral frontal scalp contusion and laceration. Sinuses/Orbits: Small anterior ethmoid and left maxillary sinus mucous retention cyst. Partial opacification of left-greater-than-right mastoid air cells. Left wall up mastoidectomy. Bilateral intra-ocular lens replacements. Other: None. CT CERVICAL SPINE FINDINGS Alignment: Straightening of cervical lordosis. Skull base and vertebrae: No acute fracture. No primary bone lesion or focal pathologic process. Soft tissues and spinal canal: No prevertebral fluid or swelling. No visible canal hematoma. Disc levels: Moderate cervical spondylosis with multilevel discogenic degenerative changes greatest at the C5-6 level with there is severe loss of disc space height. Mild  facet arthrosis. No high-grade bony canal stenosis. Bones are demineralized. Upper chest: Negative. Other: Mass in the superficial lobe of right parotid gland measuring 15 x 11 x 21 mm with dorsal rim of calcification (AP x ML x CC series 8, image 33 and series 10, image 5). Moderate calcific atherosclerosis of carotid siphons. Mild dilatation of the left laryngeal ventricle and medial rotation of the arytenoid (series 8, image 81) may represent left-sided vocal cord paralysis.  IMPRESSION: CT head: 1. No acute intracranial abnormality or calvarial fracture. 2. Right anterolateral frontal scalp small contusion and laceration. 3. Mild for age chronic microvascular ischemic changes and mild parenchymal volume loss of the brain. 4. Mild paranasal sinus disease. Partial bilateral mastoid air cell opacification. Left wall up mastoidectomy postsurgical changes. CT cervical spine: 1. No acute fracture or dislocation identified. 2. Moderate cervical spondylosis greatest at C5-6 level. No high-grade bony canal stenosis. 3. Mass in the superficial lobe of right parotid gland measuring up to 21 mm. CT or MRI of the neck with contrast is recommended to further evaluate on a nonemergent basis. 4. Findings suggesting left vocal cord paralysis, clinical correlation recommended. Electronically Signed   By: Kristine Garbe M.D.   On: 09/05/2017 21:52    Procedures .Marland KitchenLaceration Repair Date/Time: 09/05/2017 10:30 PM Performed by: Charisse March Authorized by: Orlie Dakin   Consent:    Consent obtained:  Verbal   Consent given by:  Patient   Risks discussed:  Pain and infection Anesthesia (see MAR for exact dosages):    Anesthesia method:  Local infiltration   Local anesthetic:  Lidocaine 2% WITH epi Laceration details:    Location:  Face   Face location:  Forehead   Length (cm):  5 Pre-procedure details:    Preparation:  Patient was prepped and draped in usual sterile fashion Exploration:    Wound exploration: entire depth of wound probed and visualized     Contaminated: no   Treatment:    Area cleansed with:  Saline   Amount of cleaning:  Standard   Irrigation solution:  Sterile saline   Irrigation volume:  300cc   Irrigation method:  Pressure wash Skin repair:    Repair method:  Sutures   Suture size:  5-0   Suture material:  Prolene   Suture technique:  Simple interrupted   Number of sutures:  6 Approximation:    Approximation:  Close Post-procedure  details:    Dressing:  Antibiotic ointment   (including critical care time)  Medications Ordered in ED Medications  Tdap (BOOSTRIX) injection 0.5 mL (0.5 mLs Intramuscular Given 09/05/17 2116)  lidocaine-EPINEPHrine (XYLOCAINE W/EPI) 2 %-1:200000 (PF) injection 10 mL (10 mLs Infiltration Given by Other 09/05/17 2116)  magnesium oxide (MAG-OX) tablet 400 mg (400 mg Oral Given 09/05/17 2313)  bacitracin ointment (1 application Topical Given 09/05/17 2313)   Initial Impression / Assessment and Plan / ED Course  I have reviewed the triage vital signs and the nursing notes.  Pertinent labs & imaging results that were available during my care of the patient were reviewed by me and considered in my medical decision making (see chart for details).    Initial differential diagnosis included intracranial bleed, fracture, dislocation, and laceration.  I had a low suspicion for intra-thoracic or intra-abdominal injury.  Pertinent labs included CBC without leukocytosis, anemia, or an abnormal platelet count.  BMP notable for a creatinine 1.43, increased since the last available labs in 2008 (at which time the patient's creatinine was normal).  Hypomagnesia noted.  EKG with frequent PACs; no evidence of ischemia or infarct.  Imaging studies included a head CT and c-spine CT with no traumatic injuries, however incidental mass noted in the right parotid gland, as well as possible vocal cord paralysis.  The patient was given Tdap booster and oral magnesium.  His laceration was repaired as detailed above.  Upon reassessment, he was able to ambulate without assistance and did not feel dizzy or lightheaded.  I discussed the above results with the patient who verbalized understanding.  Return precautions and follow-up plans discussed including follow-up with ENT for the incidental findings described above, as well as repeat labwork for hypomagnesemia and increased creatinine with his PCP.  He will also have his  sutures removed in 3-5 days.  The patient was discharged in stable condition.  Final Clinical Impressions(s) / ED Diagnoses   Final diagnoses:  Fall, initial encounter  Laceration of scalp, initial encounter  Hypomagnesemia   New Prescriptions Discharge Medication List as of 09/05/2017 11:10 PM       Charisse March, MD 09/06/17 4287    Orlie Dakin, MD 09/06/17 [phone]

## 2017-09-05 NOTE — Progress Notes (Signed)
   09/05/17 2047  Clinical Encounter Type  Visited With Health care provider  Visit Type Initial;ED  Referral From Care management  Advance Directives (For Healthcare)  Does Patient Have a Medical Advance Directive? No  Mental Health Advance Directives  Does Patient Have a Mental Health Advance Directive? No     chaplain got a page for a level two 83 year old male that fell and hit his head. Pt was quickly stabilized. Wife at bedside. If chaplain is needed please feel free to page. Chaplain also brought friend back to see Pt if an okay from RN.     [phone]

## 2017-09-05 NOTE — ED Notes (Signed)
Explained discharge paperwork to pt, wife, and daughter. Pt expressed understanding of following up with ENT and PCP

## 2017-09-06 ENCOUNTER — Emergency Department (HOSPITAL_COMMUNITY): Payer: Medicare Other

## 2017-09-06 ENCOUNTER — Encounter (HOSPITAL_COMMUNITY): Payer: Self-pay

## 2017-09-06 ENCOUNTER — Emergency Department (HOSPITAL_COMMUNITY)
Admission: EM | Admit: 2017-09-06 | Discharge: 2017-09-06 | Disposition: A | Discharge status: Home / Self Care | Payer: Medicare Other | Attending: Emergency Medicine | Admitting: Emergency Medicine

## 2017-09-06 DIAGNOSIS — W010XXA Fall on same level from slipping, tripping and stumbling without subsequent striking against object, initial encounter: Secondary | ICD-10-CM | POA: Diagnosis not present

## 2017-09-06 DIAGNOSIS — E119 Type 2 diabetes mellitus without complications: Secondary | ICD-10-CM | POA: Diagnosis not present

## 2017-09-06 DIAGNOSIS — Y999 Unspecified external cause status: Secondary | ICD-10-CM | POA: Insufficient documentation

## 2017-09-06 DIAGNOSIS — Z79899 Other long term (current) drug therapy: Secondary | ICD-10-CM | POA: Insufficient documentation

## 2017-09-06 DIAGNOSIS — Y9389 Activity, other specified: Secondary | ICD-10-CM | POA: Diagnosis not present

## 2017-09-06 DIAGNOSIS — S060X1A Concussion with loss of consciousness of 30 minutes or less, initial encounter: Secondary | ICD-10-CM | POA: Diagnosis not present

## 2017-09-06 DIAGNOSIS — Y929 Unspecified place or not applicable: Secondary | ICD-10-CM | POA: Insufficient documentation

## 2017-09-06 DIAGNOSIS — S0990XA Unspecified injury of head, initial encounter: Secondary | ICD-10-CM | POA: Diagnosis present

## 2017-09-06 DIAGNOSIS — Z794 Long term (current) use of insulin: Secondary | ICD-10-CM | POA: Diagnosis not present

## 2017-09-06 LAB — I-STAT CHEM 8, ED
BUN: 23 mg/dL — ABNORMAL HIGH (ref 6–20)
CALCIUM ION: 1.23 mmol/L (ref 1.15–1.40)
Chloride: 102 mmol/L (ref 101–111)
Creatinine, Ser: 0.9 mg/dL (ref 0.61–1.24)
Glucose, Bld: 197 mg/dL — ABNORMAL HIGH (ref 65–99)
HEMATOCRIT: 43 % (ref 39.0–52.0)
HEMOGLOBIN: 14.6 g/dL (ref 13.0–17.0)
Potassium: 4.1 mmol/L (ref 3.5–5.1)
SODIUM: 139 mmol/L (ref 135–145)
TCO2: 28 mmol/L (ref 22–32)

## 2017-09-06 MED ORDER — ONDANSETRON 4 MG PO TBDP: 4.0000 mg | ORAL_TABLET | Freq: Three times a day (TID) | ORAL | 0 refills | Status: DC | PRN

## 2017-09-06 MED ORDER — ONDANSETRON 4 MG PO TBDP
4.0000 mg | ORAL_TABLET | Freq: Once | ORAL | Status: AC
  Administered 2017-09-06: 4 mg via ORAL
  Filled 2017-09-06: qty 1

## 2017-09-06 NOTE — ED Notes (Signed)
Patient transported to MRI 

## 2017-09-06 NOTE — ED Notes (Signed)
ED Provider at bedside. 

## 2017-09-06 NOTE — ED Triage Notes (Signed)
Patient here for recheck following fall and evaluation yesterday in this ED. Had sutures to head for same and complains of ongoing dizziness and vomiting since accident. States his MD told him to come back. Alert and oriented

## 2017-09-06 NOTE — ED Provider Notes (Signed)
[facility]PART[facility] Provider Note   CSN: [id number] Arrival date & time: 09/06/17  1154     History   Chief Complaint No chief complaint on file.   HPI Robert Gross is a 83 y.o. male.  HPI Patient presents again today after a fall yesterday. States he been working inside in [employer] all day for a [employer]. Then had a fall and loss of conscious. Patient is not quite sure whether the loss conscious could've been before the fall. States she woke up in ambulance. Seen in the ER and had negative head CT and cervical spine CT at that time. Heparin vomiting yesterday and vomited again this morning. States he feels more unsteady also. Discussing with patient and his wife he had had some unsteadiness before the fall but has gotten worse since. No real headache. Saw his primary care doctor was told to come into the ER. No vision changes. No diarrhea. No constipation. No chest pain or trouble breathing. States that he feels unsteady. Worse with walking not showing worse with standing. States he has never been drunk so does not know what that feels like. Past Medical History:  Diagnosis Date  . Diabetes mellitus without complication ([place])     There are no active problems to display for this patient.   History reviewed. No pertinent surgical history.     Home Medications    Prior to Admission medications   Medication Sig Start Date End Date Taking? Authorizing Provider  atorvastatin (LIPITOR) 10 MG tablet Take 5 mg by mouth daily.   Yes [provider]  CALCIUM-MAGNESIUM-ZINC PO Take 1 tablet by mouth every morning.   Yes [provider]  cholecalciferol (VITAMIN D) 1000 units tablet Take 1,000 Units by mouth daily.   Yes [provider]  hydrochlorothiazide (HYDRODIURIL) 25 MG tablet Take 25 mg by mouth daily.   Yes [provider]  lisinopril (PRINIVIL,ZESTRIL) 40 MG tablet Take 40 mg by mouth daily.   Yes [provider]  metFORMIN (GLUCOPHAGE) 500 MG tablet Take 500-1,000 mg by mouth 2 (two) times daily with a meal. 1000 mg in the morning and 500 mg in the evening   Yes [provider]  pantoprazole (PROTONIX) 20 MG tablet Take 20 mg by mouth 2 (two) times daily.   Yes [provider]  ondansetron (ZOFRAN-ODT) 4 MG disintegrating tablet Take 1 tablet (4 mg total) by mouth every 8 (eight) hours as needed for nausea or vomiting. 09/06/17   Davonna Belling, MD    Family History No family history on file.  Social History Social History  Substance Use Topics  . Smoking status: Never Smoker  . Smokeless tobacco: Never Used  . Alcohol use No     Allergies   Patient has no known allergies.   Review of Systems Review of Systems  Constitutional: Positive for appetite change.  HENT: Negative for congestion.   Respiratory: Negative for shortness of breath.   Cardiovascular: Negative for chest pain.  Gastrointestinal: Negative for abdominal pain.  Genitourinary: Negative for flank pain.  Musculoskeletal: Negative for back pain.  Neurological: Positive for dizziness and headaches. Negative for weakness.  Hematological: Negative for adenopathy.  Psychiatric/Behavioral: Negative for confusion.     Physical Exam Updated Vital Signs BP 136/86   Pulse 87   Temp 98.2 F (36.8 C) (Oral)   Resp 18   SpO2 96%   Physical Exam  Constitutional: He is oriented to person, place, and time.  He appears well-developed and well-nourished.  HENT:  Laceration to right temporal area. Sutures in place.  Eyes: Pupils are equal, round, and reactive to light. EOM are normal.  No nystagmus  Neck: Neck supple.  Cardiovascular: Normal rate.   Pulmonary/Chest: Effort normal.  Abdominal: There is no tenderness.  Musculoskeletal: He exhibits no edema.  Neurological: He is alert and oriented to person, place, and time.  Finger to nose intact bilaterally. Heel-to-shin intact bilaterally.  Good grip strength bilaterally. No Romberg. However unsteadiness and stumbled with his gait. States this is not his normal baseline.  Skin: Skin is warm. Capillary refill takes less than 2 seconds.     ED Treatments / Results  Labs (all labs ordered are listed, but only abnormal results are displayed) Labs Reviewed  I-STAT CHEM 8, ED - Abnormal; Notable for the following:       Result Value   BUN 23 (*)    Glucose, Bld 197 (*)    All other components within normal limits    EKG  EKG Interpretation None       Radiology Ct Head Wo Contrast  Result Date: 09/05/2017 CLINICAL DATA:  83 y/o M; status post fall with head injury and brief loss of consciousness. EXAM: CT HEAD WITHOUT CONTRAST CT CERVICAL SPINE WITHOUT CONTRAST TECHNIQUE: Multidetector CT imaging of the head and cervical spine was performed following the standard protocol without intravenous contrast. Multiplanar CT image reconstructions of the cervical spine were also generated. COMPARISON:  None. FINDINGS: CT HEAD FINDINGS Brain: No evidence of acute infarction, hemorrhage, hydrocephalus, extra-axial collection or mass lesion/mass effect. Few nonspecific foci of hypoattenuation in subcortical white matter are compatible mild chronic microvascular ischemic changes. Mild brain parenchymal volume loss. Vascular: Calcific atherosclerosis of carotid siphons. No hyperdense vessel identified. Skull: Small right anterolateral frontal scalp contusion and laceration. Sinuses/Orbits: Small anterior ethmoid and left maxillary sinus mucous retention cyst. Partial opacification of left-greater-than-right mastoid air cells. Left wall up mastoidectomy. Bilateral intra-ocular lens replacements. Other: None. CT CERVICAL SPINE FINDINGS Alignment: Straightening of cervical lordosis. Skull base and vertebrae: No acute fracture. No primary bone lesion or focal pathologic process. Soft tissues and spinal canal: No prevertebral fluid or swelling. No  visible canal hematoma. Disc levels: Moderate cervical spondylosis with multilevel discogenic degenerative changes greatest at the C5-6 level with there is severe loss of disc space height. Mild facet arthrosis. No high-grade bony canal stenosis. Bones are demineralized. Upper chest: Negative. Other: Mass in the superficial lobe of right parotid gland measuring 15 x 11 x 21 mm with dorsal rim of calcification (AP x ML x CC series 8, image 33 and series 10, image 5). Moderate calcific atherosclerosis of carotid siphons. Mild dilatation of the left laryngeal ventricle and medial rotation of the arytenoid (series 8, image 81) may represent left-sided vocal cord paralysis. IMPRESSION: CT head: 1. No acute intracranial abnormality or calvarial fracture. 2. Right anterolateral frontal scalp small contusion and laceration. 3. Mild for age chronic microvascular ischemic changes and mild parenchymal volume loss of the brain. 4. Mild paranasal sinus disease. Partial bilateral mastoid air cell opacification. Left wall up mastoidectomy postsurgical changes. CT cervical spine: 1. No acute fracture or dislocation identified. 2. Moderate cervical spondylosis greatest at C5-6 level. No high-grade bony canal stenosis. 3. Mass in the superficial lobe of right parotid gland measuring up to 21 mm. CT or MRI of the neck with contrast is recommended to further evaluate on a nonemergent basis. 4. Findings suggesting left vocal cord paralysis,  clinical correlation recommended. Electronically Signed   By: Kristine Garbe M.D.   On: 09/05/2017 21:52   Ct Cervical Spine Wo Contrast  Result Date: 09/05/2017 CLINICAL DATA:  83 y/o M; status post fall with head injury and brief loss of consciousness. EXAM: CT HEAD WITHOUT CONTRAST CT CERVICAL SPINE WITHOUT CONTRAST TECHNIQUE: Multidetector CT imaging of the head and cervical spine was performed following the standard protocol without intravenous contrast. Multiplanar CT image  reconstructions of the cervical spine were also generated. COMPARISON:  None. FINDINGS: CT HEAD FINDINGS Brain: No evidence of acute infarction, hemorrhage, hydrocephalus, extra-axial collection or mass lesion/mass effect. Few nonspecific foci of hypoattenuation in subcortical white matter are compatible mild chronic microvascular ischemic changes. Mild brain parenchymal volume loss. Vascular: Calcific atherosclerosis of carotid siphons. No hyperdense vessel identified. Skull: Small right anterolateral frontal scalp contusion and laceration. Sinuses/Orbits: Small anterior ethmoid and left maxillary sinus mucous retention cyst. Partial opacification of left-greater-than-right mastoid air cells. Left wall up mastoidectomy. Bilateral intra-ocular lens replacements. Other: None. CT CERVICAL SPINE FINDINGS Alignment: Straightening of cervical lordosis. Skull base and vertebrae: No acute fracture. No primary bone lesion or focal pathologic process. Soft tissues and spinal canal: No prevertebral fluid or swelling. No visible canal hematoma. Disc levels: Moderate cervical spondylosis with multilevel discogenic degenerative changes greatest at the C5-6 level with there is severe loss of disc space height. Mild facet arthrosis. No high-grade bony canal stenosis. Bones are demineralized. Upper chest: Negative. Other: Mass in the superficial lobe of right parotid gland measuring 15 x 11 x 21 mm with dorsal rim of calcification (AP x ML x CC series 8, image 33 and series 10, image 5). Moderate calcific atherosclerosis of carotid siphons. Mild dilatation of the left laryngeal ventricle and medial rotation of the arytenoid (series 8, image 81) may represent left-sided vocal cord paralysis. IMPRESSION: CT head: 1. No acute intracranial abnormality or calvarial fracture. 2. Right anterolateral frontal scalp small contusion and laceration. 3. Mild for age chronic microvascular ischemic changes and mild parenchymal volume loss of the  brain. 4. Mild paranasal sinus disease. Partial bilateral mastoid air cell opacification. Left wall up mastoidectomy postsurgical changes. CT cervical spine: 1. No acute fracture or dislocation identified. 2. Moderate cervical spondylosis greatest at C5-6 level. No high-grade bony canal stenosis. 3. Mass in the superficial lobe of right parotid gland measuring up to 21 mm. CT or MRI of the neck with contrast is recommended to further evaluate on a nonemergent basis. 4. Findings suggesting left vocal cord paralysis, clinical correlation recommended. Electronically Signed   By: Kristine Garbe M.D.   On: 09/05/2017 21:52   Mr Brain Wo Contrast  Result Date: 09/06/2017 CLINICAL DATA:  Vertigo.  Fall yesterday EXAM: MRI HEAD WITHOUT CONTRAST TECHNIQUE: Multiplanar, multiecho pulse sequences of the brain and surrounding structures were obtained without intravenous contrast. COMPARISON:  None. FINDINGS: Brain: The midline structures are normal. There is no focal diffusion restriction to indicate acute infarct. There is multifocal hyperintense T2-weighted signal within the periventricular, deep and juxtacortical white matter, most often seen in the setting of chronic microvascular ischemia. No intraparenchymal hematoma or chronic microhemorrhage. Diffuse volume loss without lobar predilection. The dura is normal and there is no extra-axial collection. Vascular: Major intracranial arterial and venous sinus flow voids are preserved. Skull and upper cervical spine: The visualized skull base, calvarium, upper cervical spine and extracranial soft tissues are normal. Sinuses/Orbits: Bilateral mastoid effusions. No nasopharyngeal lesion identified. Normal orbits. IMPRESSION: 1. No acute intracranial abnormality. 2.  Findings of chronic ischemic microangiopathy and global volume loss without lobar predilection. 3. Bilateral mastoid effusions. Electronically Signed   By: Ulyses Jarred M.D.   On: 09/06/2017 19:38     Procedures Procedures (including critical care time)  Medications Ordered in ED Medications  ondansetron (ZOFRAN-ODT) disintegrating tablet 4 mg (4 mg Oral Given 09/06/17 1637)  ondansetron (ZOFRAN-ODT) disintegrating tablet 4 mg (4 mg Oral Given 09/06/17 2003)     Initial Impression / Assessment and Plan / ED Course  I have reviewed the triage vital signs and the nursing notes.  Pertinent labs & imaging results that were available during my care of the patient were reviewed by me and considered in my medical decision making (see chart for details).     Patient with nausea and vomiting and unsteadiness. Had a fall yesterday and had a CT scan at that time however the unsteadiness reportedly came before the fall. Labs from yesterday reviewed. I-STAT repeated and improved. Still had some nausea. Likely secondary to concussion. MRI done due to the dizziness which was not a stroke. Follow-up with PCP.  Final Clinical Impressions(s) / ED Diagnoses   Final diagnoses:  Concussion with loss of consciousness of 30 minutes or less, initial encounter    New Prescriptions Discharge Medication List as of 09/06/2017  7:51 PM    START taking these medications   Details  ondansetron (ZOFRAN-ODT) 4 MG disintegrating tablet Take 1 tablet (4 mg total) by mouth every 8 (eight) hours as needed for nausea or vomiting., Starting Tue 09/06/2017, Print         Davonna Belling, MD 09/07/17 0001

## 2017-09-12 ENCOUNTER — Other Ambulatory Visit: Payer: Self-pay | Admitting: Internal Medicine

## 2017-09-12 ENCOUNTER — Encounter (HOSPITAL_COMMUNITY): Payer: Self-pay | Admitting: Emergency Medicine

## 2017-09-12 ENCOUNTER — Observation Stay (HOSPITAL_COMMUNITY)
Admission: EM | Admit: 2017-09-12 | Discharge: 2017-09-13 | Disposition: A | Discharge status: Home / Self Care | Payer: Medicare Other | Attending: Family Medicine | Admitting: Family Medicine

## 2017-09-12 ENCOUNTER — Emergency Department (HOSPITAL_COMMUNITY): Payer: Medicare Other

## 2017-09-12 DIAGNOSIS — I7389 Other specified peripheral vascular diseases: Secondary | ICD-10-CM | POA: Insufficient documentation

## 2017-09-12 DIAGNOSIS — X58XXXA Exposure to other specified factors, initial encounter: Secondary | ICD-10-CM | POA: Diagnosis not present

## 2017-09-12 DIAGNOSIS — S0003XA Contusion of scalp, initial encounter: Secondary | ICD-10-CM | POA: Insufficient documentation

## 2017-09-12 DIAGNOSIS — M4050 Lordosis, unspecified, site unspecified: Secondary | ICD-10-CM | POA: Insufficient documentation

## 2017-09-12 DIAGNOSIS — A419 Sepsis, unspecified organism: Secondary | ICD-10-CM | POA: Diagnosis not present

## 2017-09-12 DIAGNOSIS — K219 Gastro-esophageal reflux disease without esophagitis: Secondary | ICD-10-CM | POA: Diagnosis present

## 2017-09-12 DIAGNOSIS — Z79899 Other long term (current) drug therapy: Secondary | ICD-10-CM | POA: Insufficient documentation

## 2017-09-12 DIAGNOSIS — W19XXXA Unspecified fall, initial encounter: Secondary | ICD-10-CM | POA: Diagnosis not present

## 2017-09-12 DIAGNOSIS — K449 Diaphragmatic hernia without obstruction or gangrene: Secondary | ICD-10-CM | POA: Insufficient documentation

## 2017-09-12 DIAGNOSIS — R109 Unspecified abdominal pain: Secondary | ICD-10-CM | POA: Diagnosis present

## 2017-09-12 DIAGNOSIS — R338 Other retention of urine: Secondary | ICD-10-CM | POA: Insufficient documentation

## 2017-09-12 DIAGNOSIS — K118 Other diseases of salivary glands: Secondary | ICD-10-CM

## 2017-09-12 DIAGNOSIS — K8020 Calculus of gallbladder without cholecystitis without obstruction: Secondary | ICD-10-CM | POA: Insufficient documentation

## 2017-09-12 DIAGNOSIS — M5136 Other intervertebral disc degeneration, lumbar region: Secondary | ICD-10-CM | POA: Insufficient documentation

## 2017-09-12 DIAGNOSIS — N138 Other obstructive and reflux uropathy: Secondary | ICD-10-CM | POA: Insufficient documentation

## 2017-09-12 DIAGNOSIS — I10 Essential (primary) hypertension: Secondary | ICD-10-CM | POA: Diagnosis present

## 2017-09-12 DIAGNOSIS — M47812 Spondylosis without myelopathy or radiculopathy, cervical region: Secondary | ICD-10-CM | POA: Diagnosis not present

## 2017-09-12 DIAGNOSIS — M2578 Osteophyte, vertebrae: Secondary | ICD-10-CM | POA: Diagnosis not present

## 2017-09-12 DIAGNOSIS — Z7984 Long term (current) use of oral hypoglycemic drugs: Secondary | ICD-10-CM | POA: Diagnosis not present

## 2017-09-12 DIAGNOSIS — N281 Cyst of kidney, acquired: Secondary | ICD-10-CM | POA: Diagnosis not present

## 2017-09-12 DIAGNOSIS — Z9181 History of falling: Secondary | ICD-10-CM | POA: Diagnosis not present

## 2017-09-12 DIAGNOSIS — K5730 Diverticulosis of large intestine without perforation or abscess without bleeding: Secondary | ICD-10-CM | POA: Diagnosis not present

## 2017-09-12 DIAGNOSIS — E119 Type 2 diabetes mellitus without complications: Secondary | ICD-10-CM | POA: Insufficient documentation

## 2017-09-12 DIAGNOSIS — E785 Hyperlipidemia, unspecified: Secondary | ICD-10-CM | POA: Insufficient documentation

## 2017-09-12 DIAGNOSIS — N401 Enlarged prostate with lower urinary tract symptoms: Secondary | ICD-10-CM | POA: Diagnosis not present

## 2017-09-12 HISTORY — DX: Essential (primary) hypertension: I10

## 2017-09-12 HISTORY — DX: Gastro-esophageal reflux disease without esophagitis: K21.9

## 2017-09-12 LAB — COMPREHENSIVE METABOLIC PANEL
ALK PHOS: 83 U/L (ref 38–126)
ALT: 16 U/L — AB (ref 17–63)
ANION GAP: 11 (ref 5–15)
AST: 21 U/L (ref 15–41)
Albumin: 3.2 g/dL — ABNORMAL LOW (ref 3.5–5.0)
BUN: 27 mg/dL — ABNORMAL HIGH (ref 6–20)
CALCIUM: 9.5 mg/dL (ref 8.9–10.3)
CO2: 25 mmol/L (ref 22–32)
CREATININE: 1.03 mg/dL (ref 0.61–1.24)
Chloride: 97 mmol/L — ABNORMAL LOW (ref 101–111)
Glucose, Bld: 194 mg/dL — ABNORMAL HIGH (ref 65–99)
Potassium: 3.9 mmol/L (ref 3.5–5.1)
SODIUM: 133 mmol/L — AB (ref 135–145)
Total Bilirubin: 0.7 mg/dL (ref 0.3–1.2)
Total Protein: 6.2 g/dL — ABNORMAL LOW (ref 6.5–8.1)

## 2017-09-12 LAB — CBC WITH DIFFERENTIAL/PLATELET
BASOS ABS: 0 10*3/uL (ref 0.0–0.1)
Band Neutrophils: 0 %
Basophils Relative: 0 %
Blasts: 0 %
Eosinophils Absolute: 0.2 10*3/uL (ref 0.0–0.7)
Eosinophils Relative: 1 %
HEMATOCRIT: 40.9 % (ref 39.0–52.0)
Hemoglobin: 14.1 g/dL (ref 13.0–17.0)
LYMPHS ABS: 0.7 10*3/uL (ref 0.7–4.0)
Lymphocytes Relative: 4 %
MCH: 33.4 pg (ref 26.0–34.0)
MCHC: 34.5 g/dL (ref 30.0–36.0)
MCV: 96.9 fL (ref 78.0–100.0)
METAMYELOCYTES PCT: 0 %
Monocytes Absolute: 1.1 10*3/uL — ABNORMAL HIGH (ref 0.1–1.0)
Monocytes Relative: 7 %
Myelocytes: 0 %
NEUTROS PCT: 88 %
NRBC: 0 /100{WBCs}
Neutro Abs: 14.3 10*3/uL — ABNORMAL HIGH (ref 1.7–7.7)
Other: 0 %
PLATELETS: 216 10*3/uL (ref 150–400)
PROMYELOCYTES ABS: 0 %
RBC: 4.22 MIL/uL (ref 4.22–5.81)
RDW: 12.7 % (ref 11.5–15.5)
WBC: 16.3 10*3/uL — AB (ref 4.0–10.5)

## 2017-09-12 LAB — URINALYSIS, ROUTINE W REFLEX MICROSCOPIC
BILIRUBIN URINE: NEGATIVE
Bacteria, UA: NONE SEEN
Glucose, UA: 150 mg/dL — AB
Ketones, ur: NEGATIVE mg/dL
LEUKOCYTES UA: NEGATIVE
Nitrite: NEGATIVE
PH: 5 (ref 5.0–8.0)
PROTEIN: NEGATIVE mg/dL
SQUAMOUS EPITHELIAL / LPF: NONE SEEN
Specific Gravity, Urine: 1.015 (ref 1.005–1.030)

## 2017-09-12 LAB — LIPASE, BLOOD: LIPASE: 29 U/L (ref 11–51)

## 2017-09-12 MED ORDER — DEXTROSE 5 % IV SOLN
1.0000 g | Freq: Once | INTRAVENOUS | Status: AC
  Administered 2017-09-12: 1 g via INTRAVENOUS
  Filled 2017-09-12: qty 10

## 2017-09-12 MED ORDER — GADOBENATE DIMEGLUMINE 529 MG/ML IV SOLN
15.0000 mL | Freq: Once | INTRAVENOUS | Status: AC | PRN
  Administered 2017-09-12: 13 mL via INTRAVENOUS

## 2017-09-12 MED ORDER — IOPAMIDOL (ISOVUE-300) INJECTION 61%
INTRAVENOUS | Status: AC
  Filled 2017-09-12: qty 100

## 2017-09-12 MED ORDER — DEXTROSE 5 % IV SOLN
1.0000 g | Freq: Once | INTRAVENOUS | Status: DC
  Filled 2017-09-12: qty 10

## 2017-09-12 MED ORDER — FENTANYL CITRATE (PF) 100 MCG/2ML IJ SOLN
50.0000 ug | Freq: Once | INTRAMUSCULAR | Status: AC
  Administered 2017-09-12: 50 ug via INTRAVENOUS
  Filled 2017-09-12: qty 2

## 2017-09-12 MED ORDER — SODIUM CHLORIDE 0.9 % IV BOLUS (SEPSIS)
500.0000 mL | Freq: Once | INTRAVENOUS | Status: AC
  Administered 2017-09-12: 500 mL via INTRAVENOUS

## 2017-09-12 MED ORDER — IOPAMIDOL (ISOVUE-300) INJECTION 61%
100.0000 mL | Freq: Once | INTRAVENOUS | Status: AC | PRN
  Administered 2017-09-12: 100 mL via INTRAVENOUS

## 2017-09-12 MED ORDER — SODIUM CHLORIDE 0.9 % IV BOLUS (SEPSIS)
1000.0000 mL | Freq: Once | INTRAVENOUS | Status: AC
  Administered 2017-09-12: 1000 mL via INTRAVENOUS

## 2017-09-12 NOTE — ED Notes (Signed)
Ambulate pt per MD order. Pt assisted OOB, walked from [place] to [facility] 23 w/o complications. PA advised. [employer]

## 2017-09-12 NOTE — ED Triage Notes (Signed)
Pt comes from home via EMS with complaints of LUQ abdominal pain.  Pt had recent fall and was seen here on 10/16.  Had sutures removed today and states the pain has been since the fall.  Pain is on opposite side affected from fall. A&O x4. Vitals WNL.

## 2017-09-12 NOTE — H&P (Signed)
History and Physical    LOGIN MUCKLEROY KKX:[id number] DOB: [birth year] DOA: 09/12/2017  Referring MD/NP/PA:   PCP: [name], MD   Patient coming from:  The patient is coming from home.  At baseline, pt is partially dependent for most of ADL.    Chief Complaint: Fever, abdominal pain, dysuria, urinary retention  HPI: Robert Gross is a 83 y.o. male with medical history significant of hypertension, hyperlipidemia, diabetes mellitus, GERD, newly diagnosed BPH, who presents with fever, abdominal pain, dysuria and urinary retention.  Patient states that he has abdominal pain today, which is located in lower abdomen, moderate, constant, dull, nonradiating. He also has difficult urinating. Found to have 1000 cc of urinary retention in ED. Patient's abdomin pain resolved after Foley cath is placed in ED. Patient states that recently he has mild dysuria and burning on urination. Daughter states that the patient had fever last night, which has resolved at currently. No chest pain, shortness of breath, cough, runny nose or sore throat. No nausea, vomiting, diarrhea currently. Of note, patient had fall a week ago and had small laceration in the right forehead. It has healed well and stitch was removed today. No unilateral weakness. Patient states that he was recently found to have BPH by his PCP, urology appointment at Wednesday was made.  ED Course: pt was found to have WBC 16.3, electrolytes renal function okay, lipase of 29, urinalysis negative, temperature normal, tachycardia, tachypnea, oxygen saturation section 96% on room air. Patient is placed on telemetry bed for observation.  # CT abdomen/pelvis 1. No CT evidence for acute solid organ injury. 2. Mild to moderate left perinephric fat stranding and edema. There is asymmetric urothelial enhancement of left renal pelvis and ureter with soft tissue stranding at the left renal pelvis suggesting urinary tract infection or inflammation. No  definitive stones along the course of the ureters. Multiple renal cysts. 3. Foley catheter balloon appears position within the very enlarged and heterogeneous prostate gland 4. Mild wall thickening of the rectum, query proctitis.  5. Gallstones 6. Sigmoid colon diverticular disease without acute inflammation  MRI-L spin showed: 1. No acute abnormality within the lumbar spine. No significant stenosis or evidence for cord compression. 2. Abnormal edema within the lateral left psoas muscle and left retroperitoneal space, likely related to the inflamed left renal collecting system as seen on prior CT. No evidence for osteomyelitis discitis or other acute infection within the lumbar spine. 3. Left eccentric degenerative disc osteophyte and facet disease at L4-5 with resultant mild to moderate left L4 foraminal narrowing. 4. Additional fairly mild for age degenerative disc bulging and facet disease as above. No other significant stenosis or evidence for neural impingement  Review of Systems:   General: has fevers, chills, no body weight gain, has poor appetite, has fatigue HEENT: no blurry vision, hearing changes or sore throat Respiratory: no dyspnea, coughing, wheezing CV: no chest pain, no palpitations GI: no nausea, vomiting, abdominal pain, diarrhea, constipation GU: has dysuria, burning on urination, urinary retention Ext: no leg edema Neuro: no unilateral weakness, numbness, or tingling, no vision change or hearing loss Skin: no rash, no skin tear. MSK: No muscle spasm, no deformity, no limitation of range of movement in spin Heme: No easy bruising.  Travel history: No recent long distant travel.  Allergy: No Known Allergies  Past Medical History:  Diagnosis Date  . Diabetes mellitus without complication ([place])   . Essential hypertension   . GERD (gastroesophageal reflux disease)  Past Surgical History:  Procedure Laterality Date  . ear surgery    . PROSTATE SURGERY       Social History:  reports that he has never smoked. He has never used smokeless tobacco. He reports that he does not drink alcohol or use drugs.  Family History:  Family History  Problem Relation Age of Onset  . Pancreatic cancer Mother   . Melanoma Sister   . Lung cancer Brother      Prior to Admission medications   Medication Sig Start Date End Date Taking? Authorizing Provider  atorvastatin (LIPITOR) 10 MG tablet Take 5 mg by mouth daily.   Yes [provider]  CALCIUM-MAGNESIUM-ZINC PO Take 1 tablet by mouth every morning.   Yes [provider]  cholecalciferol (VITAMIN D) 1000 units tablet Take 1,000 Units by mouth daily.   Yes [provider]  hydrochlorothiazide (HYDRODIURIL) 25 MG tablet Take 25 mg by mouth daily.   Yes [provider]  lisinopril (PRINIVIL,ZESTRIL) 40 MG tablet Take 40 mg by mouth daily.   Yes [provider]  metFORMIN (GLUCOPHAGE) 500 MG tablet Take 500-1,000 mg by mouth 2 (two) times daily with a meal. 1000 mg in the morning and 500 mg in the evening   Yes [provider]  pantoprazole (PROTONIX) 20 MG tablet Take 20 mg by mouth 2 (two) times daily.   Yes [provider]  ondansetron (ZOFRAN-ODT) 4 MG disintegrating tablet Take 1 tablet (4 mg total) by mouth every 8 (eight) hours as needed for nausea or vomiting. Patient not taking: Reported on 09/12/2017 09/06/17   Davonna Belling, MD    Physical Exam: Vitals:   09/12/17 2053 09/12/17 2245 09/13/17 0120 09/13/17 0200  BP: 132/61 (!) 162/70 (!) 144/66 (!) 155/58  Pulse: 86 (!) 106 91 87  Resp: 20 18 (!) 23 (!) 21  Temp: 98.6 F (37 C)  98 F (36.7 C) 98.3 F (36.8 C)  TempSrc: Oral  Oral Oral  SpO2: 97% 98% 95% 95%  Weight:    62.9 kg (138 lb 10.7 oz)  Height:    5\' 7"  (1.702 m)   General: Not in acute distress HEENT:       Eyes: PERRL, EOMI, no scleral icterus.       ENT: No discharge from the ears and nose, no pharynx  injection, no tonsillar enlargement.        Neck: No JVD, no bruit, no mass felt. Heme: No neck lymph node enlargement. Cardiac: S1/S2, RRR, No murmurs, No gallops or rubs. Respiratory: Good air movement bilaterally. No rales, wheezing, rhonchi or rubs. GI: Soft, nondistended, nontender, no rebound pain, no organomegaly, BS present. GU: No hematuria Ext: No pitting leg edema bilaterally. 2+DP/PT pulse bilaterally. Musculoskeletal: No joint deformities, No joint redness or warmth, no limitation of ROM in spin. Skin: No rashes.  Neuro: Alert, oriented X3, cranial nerves II-XII grossly intact, moves all extremities normally. Psych: Patient is not psychotic, no suicidal or hemocidal ideation.  Labs on Admission: I have personally reviewed following labs and imaging studies  CBC:  Recent Labs Lab 09/06/17 1706 09/12/17 1631  WBC  --  16.3*  NEUTROABS  --  14.3*  HGB 14.6 14.1  HCT 43.0 40.9  MCV  --  96.9  PLT  --  161   Basic Metabolic Panel:  Recent Labs Lab 09/06/17 1706 09/12/17 1631  NA 139 133*  K 4.1 3.9  CL 102 97*  CO2  --  25  GLUCOSE 197* 194*  BUN 23* 27*  CREATININE 0.90 1.03  CALCIUM  --  9.5   GFR: Estimated Creatinine Clearance: 45.8 mL/min (by C-G formula based on SCr of 1.03 mg/dL). Liver Function Tests:  Recent Labs Lab 09/12/17 1631  AST 21  ALT 16*  ALKPHOS 83  BILITOT 0.7  PROT 6.2*  ALBUMIN 3.2*    Recent Labs Lab 09/12/17 1631  LIPASE 29   No results for input(s): AMMONIA in the last 168 hours. Coagulation Profile:  Recent Labs Lab 09/13/17 0045  INR 1.06   Cardiac Enzymes: No results for input(s): CKTOTAL, CKMB, CKMBINDEX, TROPONINI in the last 168 hours. BNP (last 3 results) No results for input(s): PROBNP in the last 8760 hours. HbA1C: No results for input(s): HGBA1C in the last 72 hours. CBG:  Recent Labs Lab 09/13/17 0209  GLUCAP 197*   Lipid Profile: No results for input(s): CHOL, HDL, LDLCALC, TRIG,  CHOLHDL, LDLDIRECT in the last 72 hours. Thyroid Function Tests: No results for input(s): TSH, T4TOTAL, FREET4, T3FREE, THYROIDAB in the last 72 hours. Anemia Panel: No results for input(s): VITAMINB12, FOLATE, FERRITIN, TIBC, IRON, RETICCTPCT in the last 72 hours. Urine analysis:    Component Value Date/Time   COLORURINE YELLOW 09/12/2017 1732   APPEARANCEUR CLEAR 09/12/2017 1732   LABSPEC 1.015 09/12/2017 1732   PHURINE 5.0 09/12/2017 1732   GLUCOSEU 150 (A) 09/12/2017 1732   HGBUR MODERATE (A) 09/12/2017 1732   BILIRUBINUR NEGATIVE 09/12/2017 1732   KETONESUR NEGATIVE 09/12/2017 1732   PROTEINUR NEGATIVE 09/12/2017 1732   UROBILINOGEN 0.2 11/03/2007 2123   NITRITE NEGATIVE 09/12/2017 1732   LEUKOCYTESUR NEGATIVE 09/12/2017 1732   Sepsis Labs: @LABRCNTIP (procalcitonin:4,lacticidven:4) )No results found for this or any previous visit (from the past 240 hour(s)).   Radiological Exams on Admission: Mr Lumbar Spine W Wo Contrast  Result Date: 09/12/2017 CLINICAL DATA:  Initial evaluation for back pain, frequent falls. Evaluate for cauda equina. EXAM: MRI LUMBAR SPINE WITHOUT AND WITH CONTRAST TECHNIQUE: Multiplanar and multiecho pulse sequences of the lumbar spine were obtained without and with intravenous contrast. CONTRAST:  4mL MULTIHANCE GADOBENATE DIMEGLUMINE 529 MG/ML IV SOLN COMPARISON:  Prior CT from earlier same day. FINDINGS: Segmentation: Normal segmentation. Lowest well-formed disc labeled the L5-S1 level. Alignment: Vertebral bodies normally aligned with preservation of the normal lumbar lordosis. No listhesis is subluxation. Vertebrae: Vertebral body heights well maintained. No evidence for acute or chronic fracture. Bone marrow signal intensity somewhat heterogeneous but within normal limits. No worrisome osseous lesions. No abnormal enhancement or findings to suggest leak discitis or osteomyelitis. Prominent reactive endplate changes noted about the L4-5 interspace, most  prevalent on the left. Conus medullaris: Extends to the L1 level and appears normal. Paraspinal and other soft tissues: Edema and fluid partially visualized about the left kidney extending inferiorly within the left retroperitoneal space. Mild edema within the left psoas muscle. Mild associated enhancement. Findings appear to be largely associated with the left kidney, and are better evaluated on prior CT. Apparent increased signal intensity within the lower paraspinous musculature on sagittal STIR sequence felt to be related to incomplete fat saturation. No other acute paraspinous soft tissue abnormality. Bilateral renal cysts again noted. Disc levels: L1-2: Disc desiccation without significant disc bulge. Mild bilateral facet and ligament flavum hypertrophy. No canal or foraminal stenosis. L2-3: Disc desiccation with mild disc bulge. Mild to moderate facet and ligamentum flavum hypertrophy. No significant canal or foraminal stenosis. L3-4: Disc desiccation with minimal disc bulge. Mild to moderate facet and  ligamentum flavum hypertrophy. No significant canal or neural foraminal stenosis. L4-5: Chronic intervertebral disc space narrowing with diffuse disc bulge and disc desiccation. Left far lateral reactive endplate changes with marginal endplate osteophytic spurring. Moderate facet and ligament flavum hypertrophy. No significant canal stenosis. Mild to moderate left L4 foraminal narrowing due to disc osteophyte and facet disease. No frank neural impingement. L5-S1: Chronic intervertebral disc space narrowing with diffuse disc bulge with disc desiccation. Reactive endplate changes with marginal endplate osteophytic spurring. No significant spinal stenosis. Foramina remain patent. IMPRESSION: 1. No acute abnormality within the lumbar spine. No significant stenosis or evidence for cord compression. 2. Abnormal edema within the lateral left psoas muscle and left retroperitoneal space, likely related to the inflamed  left renal collecting system as seen on prior CT. No evidence for osteomyelitis discitis or other acute infection within the lumbar spine. 3. Left eccentric degenerative disc osteophyte and facet disease at L4-5 with resultant mild to moderate left L4 foraminal narrowing. 4. Additional fairly mild for age degenerative disc bulging and facet disease as above. No other significant stenosis or evidence for neural impingement. Electronically Signed   By: Jeannine Boga M.D.   On: 09/12/2017 20:58   Ct Abdomen Pelvis W Contrast  Result Date: 09/12/2017 CLINICAL DATA:  Abdominal pain, left upper quadrant with recent fall EXAM: CT ABDOMEN AND PELVIS WITH CONTRAST TECHNIQUE: Multidetector CT imaging of the abdomen and pelvis was performed using the standard protocol following bolus administration of intravenous contrast. CONTRAST:  100 cc Isovue-300 intravenous COMPARISON:  10/05/2004 FINDINGS: Lower chest: Lung bases demonstrate patchy dependent atelectasis. No acute consolidation or pleural effusion. Mild coronary calcification. Normal heart size. Hepatobiliary: Multiple calcified stones in the gallbladder. No focal hepatic abnormality. Granuloma. No biliary dilatation Pancreas: Unremarkable. No pancreatic ductal dilatation or surrounding inflammatory changes. Spleen: Normal in size without focal abnormality. Adrenals/Urinary Tract: Adrenal glands are within normal limits. Multiple cysts within the bilateral kidneys. Multiple subcentimeter hypodensities too small to further characterize. Mild urothelial enhancement of left renal pelvis and ureter with mild soft tissue stranding at the left renal pelvis, asymmetric compared to contralateral right kidney. Dilated extrarenal pelvis bilaterally. No ureteral stone. The bladder is decompressed. A Foley catheter is present but the balloon appears positions within the markedly enlarged prostate gland. Stomach/Bowel: Stomach is nonenlarged. No dilated small bowel. Small  hiatal hernia. Mild wall thickening of the rectum is suspected. Sigmoid colon diverticular disease without acute inflammation Vascular/Lymphatic: Aortic atherosclerosis. No significantly enlarged lymph nodes. Reproductive: Enlarged heterogeneous prostate Other: Negative for free air or significant free fluid. Musculoskeletal: No acute or suspicious bone lesion. IMPRESSION: 1. No CT evidence for acute solid organ injury. 2. Mild to moderate left perinephric fat stranding and edema. There is asymmetric urothelial enhancement of left renal pelvis and ureter with soft tissue stranding at the left renal pelvis suggesting urinary tract infection or inflammation. No definitive stones along the course of the ureters. Multiple renal cysts. 3. Foley catheter balloon appears position within the very enlarged and heterogeneous prostate gland 4. Mild wall thickening of the rectum, query proctitis. 5. Gallstones 6. Sigmoid colon diverticular disease without acute inflammation Electronically Signed   By: Donavan Foil M.D.   On: 09/12/2017 18:29     EKG: Independently reviewed. Sinus rhythm, QTC 427, anteroseptal infarction pattern, poor R-wave progression, T-wave inversion in lead 3.   Assessment/Plan Principal Problem:   Sepsis ([place]) Active Problems:   Essential hypertension   Diabetes mellitus without complication ([facility])   GERD (gastroesophageal reflux  disease)   Acute urinary retention   Abdominal pain  Sepsis [facility]): Patient meets criteria for sepsis with leukocytosis, tachycardia and tachypnea. He also had fever last night. Etiology is not clear. No respiratory symptoms. Urinalysis negative, but CT of abdomen/pelvis findings are suggestive of a urinary tract infection or inflammation. MRI-L spin showed abnormal edema within the lateral left psoas muscle and left retroperitoneal space, likely related to the inflamed left renal collecting system as seen on prior CT per radiologist, not sure if this may indicate  early stage of abscess given his recent fall and possible injury? Currently hemodynamically stable. Pending lactic acid.  -Place on telemetry bed for observation -IV Rocephin was started in ED, will continue -f/u Bx and Ux -will get Procalcitonin and trend lactic acid levels per sepsis protocol. -IVF: 2.0  L of NS bolus in ED, followed by 75 cc/h  -When necessary Zofran for nausea and morphine for pain  HTN: -continue lisinopril - hold HCTZ due to sepsis -IV hydralazine when necessary  Diabetes mellitus without complication: Last O5D about 6.4 per his daughter, well controled. Patient is taking metformin at home -SSI  GERD: -Protonix  Acute urinary retention and BPH -Foley catheter inserted -start flomax -Patient has an appointment with urology on Wednesday  Abdominal pain: Has resolved, most likely due to urinary retention. Lipase normal -observe   DVT ppx: SQ Lovenox Code Status: Full code Family Communication:    Yes, patient's daughter at bed side Disposition Plan:  Anticipate discharge back to previous home environment Consults called:  none Admission status: Obs / tele    Date of Service 09/13/2017    Ivor Costa [facility] Pager [phone]  If 7PM-7AM, please contact night-coverage [email or web address] Password [email or web address] 09/13/2017, 2:38 AM

## 2017-09-12 NOTE — ED Notes (Signed)
Bed: WA12 Expected date:  Expected time:  Means of arrival:  Comments: EMS abdominal pain 

## 2017-09-12 NOTE — ED Notes (Signed)
Patient transported to MRI 

## 2017-09-12 NOTE — ED Provider Notes (Signed)
Medical screening examination/treatment/procedure(s) were conducted as a shared visit with non-physician practitioner(s) and myself.  I personally evaluated the patient during the encounter.  [place] a week ago, hit head and hip. Has had decreased appetite, decreased BM and urinary frequency since that time with worsenig LLQ and left flank pain and one associated episode of emesis.  On exam has tachypnea, otherwise vs wnl. ttp llq, no bruising. Decreased/absent bowel sounds throughout. Mild distension. Lungs clear. Heart difficult to auscultate but obvious murmurs.  Concern for intraperitoneal bleed vs obstruction vs pelvis fracture vs renal failure. Will eval appropriately.    EKG Interpretation  Date/Time:  Monday September 12 2017 16:01:58 EDT Ventricular Rate:  77 PR Interval:    QRS Duration: 113 QT Interval:  377 QTC Calculation: 427 R Axis:   -40 Text Interpretation:  Sinus rhythm Borderline IVCD with LAD Inferior infarct, old Anterior infarct, old Lateral leads are also involved TWI inferior leads new since previous  Confirmed by Wandra Arthurs [phone]) on 09/13/2017 9:44:51 PM         Robert Gross, Corene Cornea, MD 09/19/17 1035

## 2017-09-13 ENCOUNTER — Inpatient Hospital Stay
Admission: RE | Admit: 2017-09-13 | Discharge: 2017-09-13 | Disposition: A | Discharge status: Home / Self Care | Payer: Medicare Other | Source: Ambulatory Visit | Attending: Internal Medicine | Admitting: Internal Medicine

## 2017-09-13 ENCOUNTER — Encounter (HOSPITAL_COMMUNITY): Payer: Self-pay | Admitting: Internal Medicine

## 2017-09-13 DIAGNOSIS — I10 Essential (primary) hypertension: Secondary | ICD-10-CM

## 2017-09-13 DIAGNOSIS — A419 Sepsis, unspecified organism: Secondary | ICD-10-CM | POA: Diagnosis not present

## 2017-09-13 DIAGNOSIS — R338 Other retention of urine: Secondary | ICD-10-CM

## 2017-09-13 DIAGNOSIS — E119 Type 2 diabetes mellitus without complications: Secondary | ICD-10-CM | POA: Diagnosis not present

## 2017-09-13 DIAGNOSIS — K219 Gastro-esophageal reflux disease without esophagitis: Secondary | ICD-10-CM

## 2017-09-13 LAB — CBC
HEMATOCRIT: 39.6 % (ref 39.0–52.0)
HEMOGLOBIN: 13.4 g/dL (ref 13.0–17.0)
MCH: 33 pg (ref 26.0–34.0)
MCHC: 33.8 g/dL (ref 30.0–36.0)
MCV: 97.5 fL (ref 78.0–100.0)
Platelets: 198 10*3/uL (ref 150–400)
RBC: 4.06 MIL/uL — AB (ref 4.22–5.81)
RDW: 12.8 % (ref 11.5–15.5)
WBC: 13 10*3/uL — AB (ref 4.0–10.5)

## 2017-09-13 LAB — LACTIC ACID, PLASMA
LACTIC ACID, VENOUS: 2.1 mmol/L — AB (ref 0.5–1.9)
Lactic Acid, Venous: 2 mmol/L (ref 0.5–1.9)

## 2017-09-13 LAB — BASIC METABOLIC PANEL
ANION GAP: 9 (ref 5–15)
BUN: 22 mg/dL — AB (ref 6–20)
CHLORIDE: 101 mmol/L (ref 101–111)
CO2: 26 mmol/L (ref 22–32)
Calcium: 9.1 mg/dL (ref 8.9–10.3)
Creatinine, Ser: 0.94 mg/dL (ref 0.61–1.24)
Glucose, Bld: 204 mg/dL — ABNORMAL HIGH (ref 65–99)
POTASSIUM: 3.3 mmol/L — AB (ref 3.5–5.1)
SODIUM: 136 mmol/L (ref 135–145)

## 2017-09-13 LAB — PROTIME-INR
INR: 1.06
Prothrombin Time: 13.7 seconds (ref 11.4–15.2)

## 2017-09-13 LAB — PROCALCITONIN: Procalcitonin: 0.11 ng/mL

## 2017-09-13 LAB — GLUCOSE, CAPILLARY
GLUCOSE-CAPILLARY: 107 mg/dL — AB (ref 65–99)
GLUCOSE-CAPILLARY: 134 mg/dL — AB (ref 65–99)
Glucose-Capillary: 197 mg/dL — ABNORMAL HIGH (ref 65–99)

## 2017-09-13 MED ORDER — ONDANSETRON HCL 4 MG PO TABS: 4.0000 mg | ORAL_TABLET | Freq: Four times a day (QID) | ORAL | Status: DC | PRN

## 2017-09-13 MED ORDER — SODIUM CHLORIDE 0.9 % IV BOLUS (SEPSIS)
500.0000 mL | Freq: Once | INTRAVENOUS | Status: AC
  Administered 2017-09-13: 500 mL via INTRAVENOUS

## 2017-09-13 MED ORDER — PANTOPRAZOLE SODIUM 20 MG PO TBEC
20.0000 mg | DELAYED_RELEASE_TABLET | Freq: Two times a day (BID) | ORAL | Status: DC
  Administered 2017-09-13: 20 mg via ORAL
  Filled 2017-09-13 (×3): qty 1

## 2017-09-13 MED ORDER — MORPHINE SULFATE (PF) 2 MG/ML IV SOLN: 1.0000 mg | INTRAVENOUS | Status: DC | PRN

## 2017-09-13 MED ORDER — SODIUM CHLORIDE 0.9 % IV SOLN
INTRAVENOUS | Status: DC
  Administered 2017-09-13 (×2): via INTRAVENOUS

## 2017-09-13 MED ORDER — LISINOPRIL 20 MG PO TABS
40.0000 mg | ORAL_TABLET | Freq: Every day | ORAL | Status: DC
  Administered 2017-09-13: 40 mg via ORAL
  Filled 2017-09-13: qty 2

## 2017-09-13 MED ORDER — ONDANSETRON HCL 4 MG/2ML IJ SOLN: 4.0000 mg | Freq: Four times a day (QID) | INTRAMUSCULAR | Status: DC | PRN

## 2017-09-13 MED ORDER — ACETAMINOPHEN 325 MG PO TABS: 650.0000 mg | ORAL_TABLET | Freq: Four times a day (QID) | ORAL | Status: DC | PRN

## 2017-09-13 MED ORDER — ACETAMINOPHEN 650 MG RE SUPP: 650.0000 mg | Freq: Four times a day (QID) | RECTAL | Status: DC | PRN

## 2017-09-13 MED ORDER — VITAMIN D3 25 MCG (1000 UNIT) PO TABS
1000.0000 [IU] | ORAL_TABLET | Freq: Every day | ORAL | Status: DC
  Administered 2017-09-13: 1000 [IU] via ORAL
  Filled 2017-09-13: qty 1

## 2017-09-13 MED ORDER — ENOXAPARIN SODIUM 40 MG/0.4ML ~~LOC~~ SOLN
40.0000 mg | SUBCUTANEOUS | Status: DC
  Administered 2017-09-13: 40 mg via SUBCUTANEOUS
  Filled 2017-09-13: qty 0.4

## 2017-09-13 MED ORDER — ATORVASTATIN CALCIUM 10 MG PO TABS
5.0000 mg | ORAL_TABLET | Freq: Every day | ORAL | Status: DC
  Administered 2017-09-13: 5 mg via ORAL
  Filled 2017-09-13: qty 1

## 2017-09-13 MED ORDER — ZOLPIDEM TARTRATE 5 MG PO TABS: 5.0000 mg | ORAL_TABLET | Freq: Every evening | ORAL | Status: DC | PRN

## 2017-09-13 MED ORDER — TAMSULOSIN HCL 0.4 MG PO CAPS: 0.4000 mg | ORAL_CAPSULE | Freq: Every day | ORAL | Status: DC

## 2017-09-13 MED ORDER — INFLUENZA VAC SPLIT HIGH-DOSE 0.5 ML IM SUSY: 0.5000 mL | PREFILLED_SYRINGE | INTRAMUSCULAR | Status: DC

## 2017-09-13 MED ORDER — CIPROFLOXACIN HCL 500 MG PO TABS: 500.0000 mg | ORAL_TABLET | Freq: Two times a day (BID) | ORAL | 0 refills | Status: DC

## 2017-09-13 MED ORDER — DEXTROSE 5 % IV SOLN: 1.0000 g | INTRAVENOUS | Status: DC

## 2017-09-13 MED ORDER — POLYETHYLENE GLYCOL 3350 17 G PO PACK: 17.0000 g | PACK | Freq: Every day | ORAL | Status: DC | PRN

## 2017-09-13 MED ORDER — INSULIN ASPART 100 UNIT/ML ~~LOC~~ SOLN
0.0000 [IU] | Freq: Three times a day (TID) | SUBCUTANEOUS | Status: DC
  Administered 2017-09-13: 1 [IU] via SUBCUTANEOUS

## 2017-09-13 MED ORDER — INSULIN ASPART 100 UNIT/ML ~~LOC~~ SOLN: 0.0000 [IU] | Freq: Every day | SUBCUTANEOUS | Status: DC

## 2017-09-13 MED ORDER — ADULT MULTIVITAMIN W/MINERALS CH
1.0000 | ORAL_TABLET | Freq: Every day | ORAL | Status: DC
  Administered 2017-09-13: 1 via ORAL
  Filled 2017-09-13: qty 1

## 2017-09-13 MED ORDER — ONDANSETRON HCL 4 MG/2ML IJ SOLN: 4.0000 mg | Freq: Three times a day (TID) | INTRAMUSCULAR | Status: DC | PRN

## 2017-09-13 MED ORDER — CIPROFLOXACIN HCL 500 MG PO TABS: 500.0000 mg | ORAL_TABLET | Freq: Two times a day (BID) | ORAL | Status: DC

## 2017-09-13 MED ORDER — CALCIUM-MAGNESIUM-ZINC 333-133-5 MG PO TABS: 1.0000 | ORAL_TABLET | ORAL | Status: DC

## 2017-09-13 NOTE — ED Notes (Signed)
Call report to Lattie Haw, [place] at [phone]

## 2017-09-13 NOTE — Discharge Summary (Signed)
Physician Discharge Summary  Robert Gross ION:[id number] DOB: 03/25/1929 DOA: 09/12/2017  PCP: [name], MD  Admit date: 09/12/2017 Discharge date: 09/13/2017  Admitted From: Home Disposition: Home   Recommendations for Outpatient Follow-up:  1. Follow up with PCP in 1-2 weeks 2. Follow up with urology as scheduled 10/24.   Home Health: None Equipment/Devices: None Discharge Condition: Stable CODE STATUS: Full Diet recommendation: Heart healthy/carb-modified  Brief/Interim Summary: Robert Gross is a 83 y.o. male with medical history significant of hypertension, hyperlipidemia, diabetes mellitus, GERD, newly diagnosed BPH, who presented 10/22 with pain in the lower midline abdomen described as moderate, constant, dull, nonradiating. He also has had difficulty urinating for the past day or so and his wife stated he had a fever the previous evening but none since without interventions. Bladder scan demonstrated urinary retention and foley catheter was placed with >1L UOP and alleviation of symptoms. On continued evaluation he was afebrile, WBC 16.3, SCr within normal limits, urinalysis from catheter specimen had no pyuria or bacteriuria. CT abdomen and pelvis was performed showing no acute solid organ injury, but did demonstrate mild to moderate left perinephric fat stranding and edema as well as asymmetric urothelial enhancement of left renal pelvis and ureter with soft tissue stranding at the left renal pelvis without stones. Also noted was a very enlarged and heterogeneous prostate gland. Ceftriaxone was given and he was observed overnight. He's remained afebrile and has no other symptoms. After phone discussion with urology, Dr. Gloriann Loan, it is felt that the patient may be discharged on oral ciprofloxacin and follow up with Dr. Gloriann Loan as previously scheduled 10/24.   Discharge Diagnoses:  Principal Problem:   Sepsis ([place]) Active Problems:   Essential hypertension   Diabetes  mellitus without complication ([place])   GERD (gastroesophageal reflux disease)   Acute urinary retention   Abdominal pain  Sepsis: Presumably due to urinary infection though there was no evidence of such on UA. WBC has trended downward, lactate normalized, he's remained afebrile, and he has no further abdominal pain or other symptoms. CT of abdomen/pelvis findings are suggestive of a urinary tract infection or inflammation. MRI-L spin showed abnormal edema within the lateral left psoas muscle and left retroperitoneal space, likely related to the inflamed left renal collecting system as seen on prior CT per radiologist. - Plan to switch ceftriaxone to ciprofloxacin and monitor culture results (NGTD)  Essential HTN: Continue home medications  T2DM, controlled per report. Continue home medications.   GERD: chronic, stable. Continue PPI  BPH with BOO: Continue foley until follow up with urology in 24 hours. Discussed with Dr. Gloriann Loan today.   Discharge Instructions Discharge Instructions    Diet - low sodium heart healthy    Complete by:  As directed    Diet Carb Modified    Complete by:  As directed    Discharge instructions    Complete by:  As directed    You were admitted with urinary retention due to BPH (enlarged prostate). You will need to continue having the foley catheter in until you follow up with Dr. Gloriann Loan tomorrow. Also continue taking antibiotics, we started ciprofloxacin. Seek medical attention if your abdominal pain returns.   Increase activity slowly    Complete by:  As directed      Allergies as of 09/13/2017   No Known Allergies     Medication List    TAKE these medications   atorvastatin 10 MG tablet Commonly known as:  LIPITOR Take 5 mg by  mouth daily.   CALCIUM-MAGNESIUM-ZINC PO Take 1 tablet by mouth every morning.   cholecalciferol 1000 units tablet Commonly known as:  VITAMIN D Take 1,000 Units by mouth daily.   ciprofloxacin 500 MG tablet Commonly known  as:  CIPRO Take 1 tablet (500 mg total) by mouth 2 (two) times daily.   hydrochlorothiazide 25 MG tablet Commonly known as:  HYDRODIURIL Take 25 mg by mouth daily.   lisinopril 40 MG tablet Commonly known as:  PRINIVIL,ZESTRIL Take 40 mg by mouth daily.   metFORMIN 500 MG tablet Commonly known as:  GLUCOPHAGE Take 500-1,000 mg by mouth 2 (two) times daily with a meal. 1000 mg in the morning and 500 mg in the evening   ondansetron 4 MG disintegrating tablet Commonly known as:  ZOFRAN-ODT Take 1 tablet (4 mg total) by mouth every 8 (eight) hours as needed for nausea or vomiting.   pantoprazole 20 MG tablet Commonly known as:  PROTONIX Take 20 mg by mouth 2 (two) times daily.      Follow-up Information    [name], MD Follow up.   Specialty:  Internal Medicine Contact information: [place] [address] [phone]        Lucas Mallow, MD Follow up on 09/14/2017.   Specialty:  Urology Contact information: [place] [address] [phone]          No Known Allergies  Consultations:  Urology, Dr. Gloriann Loan by phone only  Procedures/Studies: Ct Head Wo Contrast  Result Date: 09/05/2017 CLINICAL DATA:  83 y/o M; status post fall with head injury and brief loss of consciousness. EXAM: CT HEAD WITHOUT CONTRAST CT CERVICAL SPINE WITHOUT CONTRAST TECHNIQUE: Multidetector CT imaging of the head and cervical spine was performed following the standard protocol without intravenous contrast. Multiplanar CT image reconstructions of the cervical spine were also generated. COMPARISON:  None. FINDINGS: CT HEAD FINDINGS Brain: No evidence of acute infarction, hemorrhage, hydrocephalus, extra-axial collection or mass lesion/mass effect. Few nonspecific foci of hypoattenuation in subcortical white matter are compatible mild chronic microvascular ischemic changes. Mild brain parenchymal volume loss. Vascular: Calcific atherosclerosis of  carotid siphons. No hyperdense vessel identified. Skull: Small right anterolateral frontal scalp contusion and laceration. Sinuses/Orbits: Small anterior ethmoid and left maxillary sinus mucous retention cyst. Partial opacification of left-greater-than-right mastoid air cells. Left wall up mastoidectomy. Bilateral intra-ocular lens replacements. Other: None. CT CERVICAL SPINE FINDINGS Alignment: Straightening of cervical lordosis. Skull base and vertebrae: No acute fracture. No primary bone lesion or focal pathologic process. Soft tissues and spinal canal: No prevertebral fluid or swelling. No visible canal hematoma. Disc levels: Moderate cervical spondylosis with multilevel discogenic degenerative changes greatest at the C5-6 level with there is severe loss of disc space height. Mild facet arthrosis. No high-grade bony canal stenosis. Bones are demineralized. Upper chest: Negative. Other: Mass in the superficial lobe of right parotid gland measuring 15 x 11 x 21 mm with dorsal rim of calcification (AP x ML x CC series 8, image 33 and series 10, image 5). Moderate calcific atherosclerosis of carotid siphons. Mild dilatation of the left laryngeal ventricle and medial rotation of the arytenoid (series 8, image 81) may represent left-sided vocal cord paralysis. IMPRESSION: CT head: 1. No acute intracranial abnormality or calvarial fracture. 2. Right anterolateral frontal scalp small contusion and laceration. 3. Mild for age chronic microvascular ischemic changes and mild parenchymal volume loss of the brain. 4. Mild paranasal sinus disease. Partial bilateral mastoid air cell opacification.  Left wall up mastoidectomy postsurgical changes. CT cervical spine: 1. No acute fracture or dislocation identified. 2. Moderate cervical spondylosis greatest at C5-6 level. No high-grade bony canal stenosis. 3. Mass in the superficial lobe of right parotid gland measuring up to 21 mm. CT or MRI of the neck with contrast is  recommended to further evaluate on a nonemergent basis. 4. Findings suggesting left vocal cord paralysis, clinical correlation recommended. Electronically Signed   By: Kristine Garbe M.D.   On: 09/05/2017 21:52   Ct Cervical Spine Wo Contrast  Result Date: 09/05/2017 CLINICAL DATA:  83 y/o M; status post fall with head injury and brief loss of consciousness. EXAM: CT HEAD WITHOUT CONTRAST CT CERVICAL SPINE WITHOUT CONTRAST TECHNIQUE: Multidetector CT imaging of the head and cervical spine was performed following the standard protocol without intravenous contrast. Multiplanar CT image reconstructions of the cervical spine were also generated. COMPARISON:  None. FINDINGS: CT HEAD FINDINGS Brain: No evidence of acute infarction, hemorrhage, hydrocephalus, extra-axial collection or mass lesion/mass effect. Few nonspecific foci of hypoattenuation in subcortical white matter are compatible mild chronic microvascular ischemic changes. Mild brain parenchymal volume loss. Vascular: Calcific atherosclerosis of carotid siphons. No hyperdense vessel identified. Skull: Small right anterolateral frontal scalp contusion and laceration. Sinuses/Orbits: Small anterior ethmoid and left maxillary sinus mucous retention cyst. Partial opacification of left-greater-than-right mastoid air cells. Left wall up mastoidectomy. Bilateral intra-ocular lens replacements. Other: None. CT CERVICAL SPINE FINDINGS Alignment: Straightening of cervical lordosis. Skull base and vertebrae: No acute fracture. No primary bone lesion or focal pathologic process. Soft tissues and spinal canal: No prevertebral fluid or swelling. No visible canal hematoma. Disc levels: Moderate cervical spondylosis with multilevel discogenic degenerative changes greatest at the C5-6 level with there is severe loss of disc space height. Mild facet arthrosis. No high-grade bony canal stenosis. Bones are demineralized. Upper chest: Negative. Other: Mass in the  superficial lobe of right parotid gland measuring 15 x 11 x 21 mm with dorsal rim of calcification (AP x ML x CC series 8, image 33 and series 10, image 5). Moderate calcific atherosclerosis of carotid siphons. Mild dilatation of the left laryngeal ventricle and medial rotation of the arytenoid (series 8, image 81) may represent left-sided vocal cord paralysis. IMPRESSION: CT head: 1. No acute intracranial abnormality or calvarial fracture. 2. Right anterolateral frontal scalp small contusion and laceration. 3. Mild for age chronic microvascular ischemic changes and mild parenchymal volume loss of the brain. 4. Mild paranasal sinus disease. Partial bilateral mastoid air cell opacification. Left wall up mastoidectomy postsurgical changes. CT cervical spine: 1. No acute fracture or dislocation identified. 2. Moderate cervical spondylosis greatest at C5-6 level. No high-grade bony canal stenosis. 3. Mass in the superficial lobe of right parotid gland measuring up to 21 mm. CT or MRI of the neck with contrast is recommended to further evaluate on a nonemergent basis. 4. Findings suggesting left vocal cord paralysis, clinical correlation recommended. Electronically Signed   By: Kristine Garbe M.D.   On: 09/05/2017 21:52   Mr Brain Wo Contrast  Result Date: 09/06/2017 CLINICAL DATA:  Vertigo.  Fall yesterday EXAM: MRI HEAD WITHOUT CONTRAST TECHNIQUE: Multiplanar, multiecho pulse sequences of the brain and surrounding structures were obtained without intravenous contrast. COMPARISON:  None. FINDINGS: Brain: The midline structures are normal. There is no focal diffusion restriction to indicate acute infarct. There is multifocal hyperintense T2-weighted signal within the periventricular, deep and juxtacortical white matter, most often seen in the setting of chronic microvascular ischemia. No  intraparenchymal hematoma or chronic microhemorrhage. Diffuse volume loss without lobar predilection. The dura is normal  and there is no extra-axial collection. Vascular: Major intracranial arterial and venous sinus flow voids are preserved. Skull and upper cervical spine: The visualized skull base, calvarium, upper cervical spine and extracranial soft tissues are normal. Sinuses/Orbits: Bilateral mastoid effusions. No nasopharyngeal lesion identified. Normal orbits. IMPRESSION: 1. No acute intracranial abnormality. 2. Findings of chronic ischemic microangiopathy and global volume loss without lobar predilection. 3. Bilateral mastoid effusions. Electronically Signed   By: Ulyses Jarred M.D.   On: 09/06/2017 19:38   Mr Lumbar Spine W Wo Contrast  Result Date: 09/12/2017 CLINICAL DATA:  Initial evaluation for back pain, frequent falls. Evaluate for cauda equina. EXAM: MRI LUMBAR SPINE WITHOUT AND WITH CONTRAST TECHNIQUE: Multiplanar and multiecho pulse sequences of the lumbar spine were obtained without and with intravenous contrast. CONTRAST:  50mL MULTIHANCE GADOBENATE DIMEGLUMINE 529 MG/ML IV SOLN COMPARISON:  Prior CT from earlier same day. FINDINGS: Segmentation: Normal segmentation. Lowest well-formed disc labeled the L5-S1 level. Alignment: Vertebral bodies normally aligned with preservation of the normal lumbar lordosis. No listhesis is subluxation. Vertebrae: Vertebral body heights well maintained. No evidence for acute or chronic fracture. Bone marrow signal intensity somewhat heterogeneous but within normal limits. No worrisome osseous lesions. No abnormal enhancement or findings to suggest leak discitis or osteomyelitis. Prominent reactive endplate changes noted about the L4-5 interspace, most prevalent on the left. Conus medullaris: Extends to the L1 level and appears normal. Paraspinal and other soft tissues: Edema and fluid partially visualized about the left kidney extending inferiorly within the left retroperitoneal space. Mild edema within the left psoas muscle. Mild associated enhancement. Findings appear to be  largely associated with the left kidney, and are better evaluated on prior CT. Apparent increased signal intensity within the lower paraspinous musculature on sagittal STIR sequence felt to be related to incomplete fat saturation. No other acute paraspinous soft tissue abnormality. Bilateral renal cysts again noted. Disc levels: L1-2: Disc desiccation without significant disc bulge. Mild bilateral facet and ligament flavum hypertrophy. No canal or foraminal stenosis. L2-3: Disc desiccation with mild disc bulge. Mild to moderate facet and ligamentum flavum hypertrophy. No significant canal or foraminal stenosis. L3-4: Disc desiccation with minimal disc bulge. Mild to moderate facet and ligamentum flavum hypertrophy. No significant canal or neural foraminal stenosis. L4-5: Chronic intervertebral disc space narrowing with diffuse disc bulge and disc desiccation. Left far lateral reactive endplate changes with marginal endplate osteophytic spurring. Moderate facet and ligament flavum hypertrophy. No significant canal stenosis. Mild to moderate left L4 foraminal narrowing due to disc osteophyte and facet disease. No frank neural impingement. L5-S1: Chronic intervertebral disc space narrowing with diffuse disc bulge with disc desiccation. Reactive endplate changes with marginal endplate osteophytic spurring. No significant spinal stenosis. Foramina remain patent. IMPRESSION: 1. No acute abnormality within the lumbar spine. No significant stenosis or evidence for cord compression. 2. Abnormal edema within the lateral left psoas muscle and left retroperitoneal space, likely related to the inflamed left renal collecting system as seen on prior CT. No evidence for osteomyelitis discitis or other acute infection within the lumbar spine. 3. Left eccentric degenerative disc osteophyte and facet disease at L4-5 with resultant mild to moderate left L4 foraminal narrowing. 4. Additional fairly mild for age degenerative disc bulging  and facet disease as above. No other significant stenosis or evidence for neural impingement. Electronically Signed   By: Jeannine Boga M.D.   On: 09/12/2017 20:58  Ct Abdomen Pelvis W Contrast  Result Date: 09/12/2017 CLINICAL DATA:  Abdominal pain, left upper quadrant with recent fall EXAM: CT ABDOMEN AND PELVIS WITH CONTRAST TECHNIQUE: Multidetector CT imaging of the abdomen and pelvis was performed using the standard protocol following bolus administration of intravenous contrast. CONTRAST:  100 cc Isovue-300 intravenous COMPARISON:  10/05/2004 FINDINGS: Lower chest: Lung bases demonstrate patchy dependent atelectasis. No acute consolidation or pleural effusion. Mild coronary calcification. Normal heart size. Hepatobiliary: Multiple calcified stones in the gallbladder. No focal hepatic abnormality. Granuloma. No biliary dilatation Pancreas: Unremarkable. No pancreatic ductal dilatation or surrounding inflammatory changes. Spleen: Normal in size without focal abnormality. Adrenals/Urinary Tract: Adrenal glands are within normal limits. Multiple cysts within the bilateral kidneys. Multiple subcentimeter hypodensities too small to further characterize. Mild urothelial enhancement of left renal pelvis and ureter with mild soft tissue stranding at the left renal pelvis, asymmetric compared to contralateral right kidney. Dilated extrarenal pelvis bilaterally. No ureteral stone. The bladder is decompressed. A Foley catheter is present but the balloon appears positions within the markedly enlarged prostate gland. Stomach/Bowel: Stomach is nonenlarged. No dilated small bowel. Small hiatal hernia. Mild wall thickening of the rectum is suspected. Sigmoid colon diverticular disease without acute inflammation Vascular/Lymphatic: Aortic atherosclerosis. No significantly enlarged lymph nodes. Reproductive: Enlarged heterogeneous prostate Other: Negative for free air or significant free fluid. Musculoskeletal: No  acute or suspicious bone lesion. IMPRESSION: 1. No CT evidence for acute solid organ injury. 2. Mild to moderate left perinephric fat stranding and edema. There is asymmetric urothelial enhancement of left renal pelvis and ureter with soft tissue stranding at the left renal pelvis suggesting urinary tract infection or inflammation. No definitive stones along the course of the ureters. Multiple renal cysts. 3. Foley catheter balloon appears position within the very enlarged and heterogeneous prostate gland 4. Mild wall thickening of the rectum, query proctitis. 5. Gallstones 6. Sigmoid colon diverticular disease without acute inflammation Electronically Signed   By: Donavan Foil M.D.   On: 09/12/2017 18:29   Subjective: Feels fine. No fevers. Once foley was in had no more pain. Got up to bathroom without assistance.   Discharge Exam: Vitals:   09/13/17 0200 09/13/17 0513  BP: (!) 155/58 (!) 143/64  Pulse: 87 84  Resp: (!) 21 16  Temp: 98.3 F (36.8 C) 99 F (37.2 C)  SpO2: 95% 96%   General: Pleasant elderly gentleman in no distress Cardiovascular: RRR, S1/S2 +, no rubs, no gallops Respiratory: CTA bilaterally, no wheezing, no rhonchi Abdominal: Soft, NT, ND, bowel sounds +. No CVA tenderness GU: Foley in place draining light yellow urine. No clots. MSK: No palpable tenderness to spine or paraspinal muscles. No tenderness with full ROM of hips bilaterally.  Neuro: Steady gait.   Labs: Basic Metabolic Panel:  Recent Labs Lab 09/06/17 1706 09/12/17 1631 09/13/17 0249  NA 139 133* 136  K 4.1 3.9 3.3*  CL 102 97* 101  CO2  --  25 26  GLUCOSE 197* 194* 204*  BUN 23* 27* 22*  CREATININE 0.90 1.03 0.94  CALCIUM  --  9.5 9.1   Liver Function Tests:  Recent Labs Lab 09/12/17 1631  AST 21  ALT 16*  ALKPHOS 83  BILITOT 0.7  PROT 6.2*  ALBUMIN 3.2*    Recent Labs Lab 09/12/17 1631  LIPASE 29   CBC:  Recent Labs Lab 09/06/17 1706 09/12/17 1631 09/13/17 0249  WBC   --  16.3* 13.0*  NEUTROABS  --  14.3*  --  HGB 14.6 14.1 13.4  HCT 43.0 40.9 39.6  MCV  --  96.9 97.5  PLT  --  216 198   Urinalysis    Component Value Date/Time   COLORURINE YELLOW 09/12/2017 [place] 09/12/2017 1732   LABSPEC 1.015 09/12/2017 1732   PHURINE 5.0 09/12/2017 1732   GLUCOSEU 150 (A) 09/12/2017 1732   HGBUR MODERATE (A) 09/12/2017 1732   BILIRUBINUR NEGATIVE 09/12/2017 1732   KETONESUR NEGATIVE 09/12/2017 1732   PROTEINUR NEGATIVE 09/12/2017 1732   UROBILINOGEN 0.2 11/03/2007 2123   NITRITE NEGATIVE 09/12/2017 1732   LEUKOCYTESUR NEGATIVE 09/12/2017 1732    Time coordinating discharge: Approximately 40 minutes  Vance Gather, MD  [facility] 09/13/2017, 12:19 PM Pager [phone]

## 2017-09-13 NOTE — ED Provider Notes (Signed)
[facility] Provider Note   CSN: [id number] Arrival date & time: 09/12/17  1544     History   Chief Complaint No chief complaint on file.   HPI HATTIE AGUINALDO is a 83 y.o. male.  HPI 83 year old Caucasian male past medical history significant for diabetes and hypertension presents to the ED for evaluation of left lower quadrant abdominal pain.  Patient fell 1 week ago after mechanical fall and was seen in the ED.  Had negative CT and cervical scans at that time.  Patient was seen again after the fall in the ED for unsteadiness, vomiting and nausea and dizziness.  MRI was done that showed no acute stroke.  Patient complains of decreased appetite, decreased bowel movement, urinary frequency and retention was worsening left lower quadrant and left flank pain.  Patient states he had one episode of emesis today.  Daughter reports that he had a fever last night.  Patient was seen by his PCP today to have his sutures of his head removed.  They scheduled him for urology appointment on Wednesday this week.  The patient states the pain is gotten unbearable which is why he came to the ED for evaluation.  States the pain is intermittent.  Also reports abdominal distention.  Nothing makes his pain better or worse.  He has not tried anything for the pain prior to arrival.  Patient reports passing gas.   Pt denies any fever, chill, ha, vision changes, lightheadedness, dizziness, congestion, neck pain, cp, sob, cough, , urinary symptoms, melena, hematochezia, lower extremity paresthesias.  Past Medical History:  Diagnosis Date  . Diabetes mellitus without complication ([place])   . Essential hypertension   . GERD (gastroesophageal reflux disease)     Patient Active Problem List   Diagnosis Date Noted  . Acute urinary retention 09/12/2017  . Sepsis ([place]) 09/12/2017  . Abdominal pain 09/12/2017  . Essential hypertension   . Diabetes mellitus without  complication ([place])   . GERD (gastroesophageal reflux disease)     History reviewed. No pertinent surgical history.     Home Medications    Prior to Admission medications   Medication Sig Start Date End Date Taking? Authorizing Provider  atorvastatin (LIPITOR) 10 MG tablet Take 5 mg by mouth daily.   Yes [provider]  CALCIUM-MAGNESIUM-ZINC PO Take 1 tablet by mouth every morning.   Yes [provider]  cholecalciferol (VITAMIN D) 1000 units tablet Take 1,000 Units by mouth daily.   Yes [provider]  hydrochlorothiazide (HYDRODIURIL) 25 MG tablet Take 25 mg by mouth daily.   Yes [provider]  lisinopril (PRINIVIL,ZESTRIL) 40 MG tablet Take 40 mg by mouth daily.   Yes [provider]  metFORMIN (GLUCOPHAGE) 500 MG tablet Take 500-1,000 mg by mouth 2 (two) times daily with a meal. 1000 mg in the morning and 500 mg in the evening   Yes [provider]  pantoprazole (PROTONIX) 20 MG tablet Take 20 mg by mouth 2 (two) times daily.   Yes [provider]  ondansetron (ZOFRAN-ODT) 4 MG disintegrating tablet Take 1 tablet (4 mg total) by mouth every 8 (eight) hours as needed for nausea or vomiting. Patient not taking: Reported on 09/12/2017 09/06/17   Davonna Belling, MD    Family History No family history on file.  Social History Social History  Substance Use Topics  . Smoking status: Never Smoker  . Smokeless tobacco: Never Used  . Alcohol use No  Allergies   Patient has no known allergies.   Review of Systems Review of Systems  Constitutional: Negative for chills and fever.  HENT: Negative for congestion and sore throat.   Eyes: Negative for visual disturbance.  Respiratory: Negative for cough and shortness of breath.   Cardiovascular: Negative for chest pain.  Gastrointestinal: Positive for abdominal pain, nausea and vomiting. Negative for diarrhea and rectal pain.  Genitourinary: Positive for  decreased urine volume, difficulty urinating, flank pain, frequency and urgency. Negative for dysuria and hematuria.  Musculoskeletal: Negative for arthralgias and myalgias.  Skin: Negative for rash.  Neurological: Negative for dizziness, syncope, weakness, light-headedness, numbness and headaches.  Psychiatric/Behavioral: Negative for sleep disturbance. The patient is not nervous/anxious.      Physical Exam Updated Vital Signs BP (!) 155/58 (BP Location: Right Arm)   Pulse 87   Temp 98.3 F (36.8 C) (Oral)   Resp (!) 21   SpO2 95%   Physical Exam  Constitutional: He is oriented to person, place, and time. He appears well-developed and well-nourished.  Non-toxic appearance. No distress.  HENT:  Head: Normocephalic and atraumatic.  Mouth/Throat: Oropharynx is clear and moist.  Eyes: Pupils are equal, round, and reactive to light. Conjunctivae are normal. Right eye exhibits no discharge. Left eye exhibits no discharge.  Neck: Normal range of motion. Neck supple.  Cardiovascular: Normal rate, regular rhythm, normal heart sounds and intact distal pulses.  Exam reveals no gallop and no friction rub.   No murmur heard. Pulmonary/Chest: Effort normal and breath sounds normal. No respiratory distress. He has no wheezes. He has no rales. He exhibits no tenderness.  Abdominal: Soft. He exhibits no distension. Bowel sounds are decreased. There is tenderness in the suprapubic area and left lower quadrant. There is no rigidity, no rebound, no guarding, no CVA tenderness, no tenderness at McBurney's point and negative Murphy's sign.  Genitourinary:  Genitourinary Comments: Chaperone present for exam. Pt tolerated without difficulty. No external hemorrhoids or fissures noted. No pain with palpation of the rectal vault. No internal hemorrhoids noted. Soft brown stool noted in the rectal vault. No gross hematochezia or melena.  Enlarged prostate.  No pain with palpation over the prostate.  Normal rectal  tone.  Musculoskeletal: Normal range of motion. He exhibits no tenderness.  Healing ecchymosis to the right thigh.  No midline T spine or L spine tenderness. No deformities or step offs noted. Full ROM. Pelvis is stable.   Lymphadenopathy:    He has no cervical adenopathy.  Neurological: He is alert and oriented to person, place, and time.  Strength 5 out of 5 in lower extremities.  Patellar reflexes are normal.  Sensation intact in all dermatomes.  Skin: Skin is warm and dry. Capillary refill takes less than 2 seconds. No rash noted.  Psychiatric: His behavior is normal. Judgment and thought content normal.  Nursing note and vitals reviewed.    ED Treatments / Results  Labs (all labs ordered are listed, but only abnormal results are displayed) Labs Reviewed  COMPREHENSIVE METABOLIC PANEL - Abnormal; Notable for the following:       Result Value   Sodium 133 (*)    Chloride 97 (*)    Glucose, Bld 194 (*)    BUN 27 (*)    Total Protein 6.2 (*)    Albumin 3.2 (*)    ALT 16 (*)    All other components within normal limits  CBC WITH DIFFERENTIAL/PLATELET - Abnormal; Notable for the following:  WBC 16.3 (*)    Neutro Abs 14.3 (*)    Monocytes Absolute 1.1 (*)    All other components within normal limits  URINALYSIS, ROUTINE W REFLEX MICROSCOPIC - Abnormal; Notable for the following:    Glucose, UA 150 (*)    Hgb urine dipstick MODERATE (*)    All other components within normal limits  LACTIC ACID, PLASMA - Abnormal; Notable for the following:    Lactic Acid, Venous 2.1 (*)    All other components within normal limits  CULTURE, BLOOD (ROUTINE X 2)  CULTURE, BLOOD (ROUTINE X 2)  URINE CULTURE  LIPASE, BLOOD  PROCALCITONIN  PROTIME-INR  LACTIC ACID, PLASMA  BASIC METABOLIC PANEL  CBC    EKG  EKG Interpretation None       Radiology Mr Lumbar Spine W Wo Contrast  Result Date: 09/12/2017 CLINICAL DATA:  Initial evaluation for back pain, frequent falls. Evaluate  for cauda equina. EXAM: MRI LUMBAR SPINE WITHOUT AND WITH CONTRAST TECHNIQUE: Multiplanar and multiecho pulse sequences of the lumbar spine were obtained without and with intravenous contrast. CONTRAST:  11mL MULTIHANCE GADOBENATE DIMEGLUMINE 529 MG/ML IV SOLN COMPARISON:  Prior CT from earlier same day. FINDINGS: Segmentation: Normal segmentation. Lowest well-formed disc labeled the L5-S1 level. Alignment: Vertebral bodies normally aligned with preservation of the normal lumbar lordosis. No listhesis is subluxation. Vertebrae: Vertebral body heights well maintained. No evidence for acute or chronic fracture. Bone marrow signal intensity somewhat heterogeneous but within normal limits. No worrisome osseous lesions. No abnormal enhancement or findings to suggest leak discitis or osteomyelitis. Prominent reactive endplate changes noted about the L4-5 interspace, most prevalent on the left. Conus medullaris: Extends to the L1 level and appears normal. Paraspinal and other soft tissues: Edema and fluid partially visualized about the left kidney extending inferiorly within the left retroperitoneal space. Mild edema within the left psoas muscle. Mild associated enhancement. Findings appear to be largely associated with the left kidney, and are better evaluated on prior CT. Apparent increased signal intensity within the lower paraspinous musculature on sagittal STIR sequence felt to be related to incomplete fat saturation. No other acute paraspinous soft tissue abnormality. Bilateral renal cysts again noted. Disc levels: L1-2: Disc desiccation without significant disc bulge. Mild bilateral facet and ligament flavum hypertrophy. No canal or foraminal stenosis. L2-3: Disc desiccation with mild disc bulge. Mild to moderate facet and ligamentum flavum hypertrophy. No significant canal or foraminal stenosis. L3-4: Disc desiccation with minimal disc bulge. Mild to moderate facet and ligamentum flavum hypertrophy. No significant  canal or neural foraminal stenosis. L4-5: Chronic intervertebral disc space narrowing with diffuse disc bulge and disc desiccation. Left far lateral reactive endplate changes with marginal endplate osteophytic spurring. Moderate facet and ligament flavum hypertrophy. No significant canal stenosis. Mild to moderate left L4 foraminal narrowing due to disc osteophyte and facet disease. No frank neural impingement. L5-S1: Chronic intervertebral disc space narrowing with diffuse disc bulge with disc desiccation. Reactive endplate changes with marginal endplate osteophytic spurring. No significant spinal stenosis. Foramina remain patent. IMPRESSION: 1. No acute abnormality within the lumbar spine. No significant stenosis or evidence for cord compression. 2. Abnormal edema within the lateral left psoas muscle and left retroperitoneal space, likely related to the inflamed left renal collecting system as seen on prior CT. No evidence for osteomyelitis discitis or other acute infection within the lumbar spine. 3. Left eccentric degenerative disc osteophyte and facet disease at L4-5 with resultant mild to moderate left L4 foraminal narrowing. 4. Additional fairly mild  for age degenerative disc bulging and facet disease as above. No other significant stenosis or evidence for neural impingement. Electronically Signed   By: Jeannine Boga M.D.   On: 09/12/2017 20:58   Ct Abdomen Pelvis W Contrast  Result Date: 09/12/2017 CLINICAL DATA:  Abdominal pain, left upper quadrant with recent fall EXAM: CT ABDOMEN AND PELVIS WITH CONTRAST TECHNIQUE: Multidetector CT imaging of the abdomen and pelvis was performed using the standard protocol following bolus administration of intravenous contrast. CONTRAST:  100 cc Isovue-300 intravenous COMPARISON:  10/05/2004 FINDINGS: Lower chest: Lung bases demonstrate patchy dependent atelectasis. No acute consolidation or pleural effusion. Mild coronary calcification. Normal heart size.  Hepatobiliary: Multiple calcified stones in the gallbladder. No focal hepatic abnormality. Granuloma. No biliary dilatation Pancreas: Unremarkable. No pancreatic ductal dilatation or surrounding inflammatory changes. Spleen: Normal in size without focal abnormality. Adrenals/Urinary Tract: Adrenal glands are within normal limits. Multiple cysts within the bilateral kidneys. Multiple subcentimeter hypodensities too small to further characterize. Mild urothelial enhancement of left renal pelvis and ureter with mild soft tissue stranding at the left renal pelvis, asymmetric compared to contralateral right kidney. Dilated extrarenal pelvis bilaterally. No ureteral stone. The bladder is decompressed. A Foley catheter is present but the balloon appears positions within the markedly enlarged prostate gland. Stomach/Bowel: Stomach is nonenlarged. No dilated small bowel. Small hiatal hernia. Mild wall thickening of the rectum is suspected. Sigmoid colon diverticular disease without acute inflammation Vascular/Lymphatic: Aortic atherosclerosis. No significantly enlarged lymph nodes. Reproductive: Enlarged heterogeneous prostate Other: Negative for free air or significant free fluid. Musculoskeletal: No acute or suspicious bone lesion. IMPRESSION: 1. No CT evidence for acute solid organ injury. 2. Mild to moderate left perinephric fat stranding and edema. There is asymmetric urothelial enhancement of left renal pelvis and ureter with soft tissue stranding at the left renal pelvis suggesting urinary tract infection or inflammation. No definitive stones along the course of the ureters. Multiple renal cysts. 3. Foley catheter balloon appears position within the very enlarged and heterogeneous prostate gland 4. Mild wall thickening of the rectum, query proctitis. 5. Gallstones 6. Sigmoid colon diverticular disease without acute inflammation Electronically Signed   By: Donavan Foil M.D.   On: 09/12/2017 18:29     Procedures Procedures (including critical care time)  Medications Ordered in ED Medications  cefTRIAXone (ROCEPHIN) 1 g in dextrose 5 % 50 mL IVPB (not administered)  atorvastatin (LIPITOR) tablet 5 mg (not administered)  Calcium-Magnesium-Zinc 333-133-5 MG TABS 1 tablet (not administered)  cholecalciferol (VITAMIN D) tablet 1,000 Units (not administered)  lisinopril (PRINIVIL,ZESTRIL) tablet 40 mg (not administered)  pantoprazole (PROTONIX) EC tablet 20 mg (not administered)  tamsulosin (FLOMAX) capsule 0.4 mg (not administered)  ondansetron (ZOFRAN) injection 4 mg (not administered)  morphine 2 MG/ML injection 1 mg (not administered)  insulin aspart (novoLOG) injection 0-9 Units (not administered)  insulin aspart (novoLOG) injection 0-5 Units (not administered)  0.9 %  sodium chloride infusion (not administered)  enoxaparin (LOVENOX) injection 40 mg (not administered)  acetaminophen (TYLENOL) tablet 650 mg (not administered)    Or  acetaminophen (TYLENOL) suppository 650 mg (not administered)  zolpidem (AMBIEN) tablet 5 mg (not administered)  sodium chloride 0.9 % bolus 1,000 mL (1,000 mLs Intravenous New Bag/Given 09/12/17 1723)  fentaNYL (SUBLIMAZE) injection 50 mcg (50 mcg Intravenous Given 09/12/17 1723)  iopamidol (ISOVUE-300) 61 % injection 100 mL (100 mLs Intravenous Contrast Given 09/12/17 1754)  sodium chloride 0.9 % bolus 500 mL (500 mLs Intravenous New Bag/Given 09/12/17 1957)  gadobenate dimeglumine (  MULTIHANCE) injection 15 mL (13 mLs Intravenous Contrast Given 09/12/17 2030)  cefTRIAXone (ROCEPHIN) 1 g in dextrose 5 % 50 mL IVPB (1 g Intravenous New Bag/Given 09/12/17 2306)     Initial Impression / Assessment and Plan / ED Course  I have reviewed the triage vital signs and the nursing notes.  Pertinent labs & imaging results that were available during my care of the patient were reviewed by me and considered in my medical decision making (see chart for  details).     Patient presents to the ED for evaluation of left lower quadrant abdominal pain with associated decreased appetite, decreased urine output and decreased bowel movements.  Patient also reports associated nausea and one episode of emesis with subjective fevers at home.  Patient was seen in the ED last week for mechanical fall.  Patient is overall well-appearing and nontoxic.   Patient is afebrile.  No tachycardia or hypotension noted.  Mild tachypnea noted.  Exam reveals some left lower quadrant abdominal pain to palpation but no signs of peritonitis.  Decreased bowel sounds.  Lungs clear to auscultation bilaterally.  Lab work notable for leukocytosis of 18,000.  UA with moderate hemoglobin and RBCs but no leukocytes or bacteria seen.  Normal kidney function.  Electrolytes are at baseline.  Lipase is normal.  Bladder scan was performed.  Over thousand cc of urine noted in the bladder.  Acute urinary retention of unknown cause.  Foley catheter was placed.  Patient had significant improvement in his pain after bladder was drained.  CT scan abd/pelvis was obtained;  1. No CT evidence for acute solid organ injury. 2. Mild to moderate left perinephric fat stranding and edema. There is asymmetric urothelial enhancement of left renal pelvis and ureter with soft tissue stranding at the left renal pelvis suggesting urinary tract infection or inflammation. No definitive stones along the course of the ureters. Multiple renal cysts. 3. Foley catheter balloon appears position within the very enlarged and heterogeneous prostate gland 4. Mild wall thickening of the rectum, query proctitis.  5. Gallstones 6. Sigmoid colon diverticular disease without acute inflammation  Unknown cause of patient's acute urinary retention.  Concern for possible cauda equina or neurogenic bladder given recent fall versus developing infection from fall given the leukocytosis with the subjective fevers at home.  MRI  was obtained of the lumbar spine with and without contrast.  1. No acute abnormality within the lumbar spine. No significant stenosis or evidence for cord compression. 2. Abnormal edema within the lateral left psoas muscle and left retroperitoneal space, likely related to the inflamed left renal collecting system as seen on prior CT. No evidence for osteomyelitis discitis or other acute infection within the lumbar spine. 3. Left eccentric degenerative disc osteophyte and facet disease at L4-5 with resultant mild to moderate left L4 foraminal narrowing. 4. Additional fairly mild for age degenerative disc bulging and facet disease as above. No other significant stenosis or evidence for neural impingement  Unknown cause of the abnormal edema within the left lateral psoas muscle and left retroperitoneal space.  Possibly developing infection given recent fall and leukocytosis.  Patient started on Rocephin.  Fluids were given for dehydration.  Pain medicine given with significant improvement in pain.  Given the CT and MRI findings with no clear cause of leukocytosis for the patient would benefit from hospital admission.  Spoke with Dr. Blaine Hamper with hospital medicine who agrees to admission and place admission orders.  Patient remains hemodynamic stable at this time.  He was updated on plan of care.  All questions were answered.  Patient was seen and evaluated by attending Dr. Dayna Barker who is agreeable to the above plan.      Final Clinical Impressions(s) / ED Diagnoses   Final diagnoses:  Essential hypertension  Diabetes mellitus without complication ([place])  Gastroesophageal reflux disease without esophagitis    New Prescriptions Current Discharge Medication List       Aaron Edelman 09/13/17 1617    Mesner, Corene Cornea, MD 09/19/17 1035

## 2017-09-13 NOTE — Progress Notes (Signed)
Pharmacy Antibiotic Note  Robert Gross is a 83 y.o. male admitted on 09/12/2017 with UTI.  Pharmacy has been consulted for cipro dosing.  Plan: Cipro 500 mg po bid Pharmacy to sign off  Height: 5\' 7"  (170.2 cm) Weight: 138 lb 10.7 oz (62.9 kg) IBW/kg (Calculated) : 66.1  Temp (24hrs), Avg:98.4 F (36.9 C), Min:98 F (36.7 C), Max:99 F (37.2 C)   Recent Labs Lab 09/06/17 1706 09/12/17 1631 09/13/17 0045 09/13/17 0249  WBC  --  16.3*  --  13.0*  CREATININE 0.90 1.03  --  0.94  LATICACIDVEN  --   --  2.1* 2.0*    Estimated Creatinine Clearance: 50.2 mL/min (by C-G formula based on SCr of 0.94 mg/dL).    No Known Allergies   Eudelia Bunch, Pharm.D. [phone] 09/13/2017 12:21 PM

## 2017-09-14 LAB — URINE CULTURE: Culture: NO GROWTH

## 2017-09-18 LAB — CULTURE, BLOOD (ROUTINE X 2)
Culture: NO GROWTH
Culture: NO GROWTH
SPECIAL REQUESTS: ADEQUATE
Special Requests: ADEQUATE

## 2017-09-23 ENCOUNTER — Other Ambulatory Visit: Payer: Self-pay | Admitting: Urology

## 2017-09-26 ENCOUNTER — Other Ambulatory Visit: Payer: Self-pay | Admitting: Urology

## 2017-09-26 ENCOUNTER — Encounter (HOSPITAL_BASED_OUTPATIENT_CLINIC_OR_DEPARTMENT_OTHER): Payer: Self-pay | Admitting: *Deleted

## 2017-09-26 NOTE — Progress Notes (Signed)
SPOKE W/ PT AND WIFE.  NPO AFTER MN.  ARRIVE AT 0630.  NEEDS CBC, T&S, ISTAT 8.  CURRENT EKG IN CHART AND Epic.  WILL TAKE PROTONIX AND LIPITOR AM DOS W/ SIPS OF WATER.  REVIEWED [facility] GUIDELINES, WILL BRING MEDS.

## 2017-09-27 ENCOUNTER — Encounter (HOSPITAL_BASED_OUTPATIENT_CLINIC_OR_DEPARTMENT_OTHER): Payer: Self-pay | Admitting: Anesthesiology

## 2017-09-27 NOTE — Anesthesia Preprocedure Evaluation (Addendum)
Anesthesia Evaluation  Patient identified by MRN, date of birth, ID band Patient awake    Reviewed: Allergy & Precautions, NPO status , Patient's Chart, lab work & pertinent test results  Airway Mallampati: [name]  TM Distance: >3 FB Neck ROM: Limited    Dental  (+) Partial Upper, Teeth Intact, Caps,    Pulmonary former smoker,    Pulmonary exam normal breath sounds clear to auscultation       Cardiovascular hypertension, Pt. on medications  Rhythm:Irregular Rate:Normal     Neuro/Psych negative neurological ROS  negative psych ROS   GI/Hepatic Neg liver ROS, GERD  Medicated and Controlled,  Endo/Other  diabetes, Well Controlled, Type 2, Oral Hypoglycemic AgentsHyperlipidemia  Renal/GU negative Renal ROS   Urinary retention BPH Hx/o acute prostatitis Hx/o urosepsis    Musculoskeletal  (+) Arthritis , Osteoarthritis,    Abdominal   Peds  Hematology negative hematology ROS (+)   Anesthesia Other Findings Recent fall 2 weeks ago, resulting in a concussion and one day of hospitalization. Pt relays he does not recall why he fell.  Reproductive/Obstetrics                           Anesthesia Physical Anesthesia Plan  ASA: [name]  Anesthesia Plan: General   Post-op Pain Management:    Induction: Intravenous  PONV Risk Score and Plan: 2 and Ondansetron, Propofol infusion, Promethazine and Treatment may vary due to age or medical condition  Airway Management Planned: LMA  Additional Equipment:   Intra-op Plan:   Post-operative Plan: Extubation in OR  Informed Consent: I have reviewed the patients History and Physical, chart, labs and discussed the procedure including the risks, benefits and alternatives for the proposed anesthesia with the patient or authorized representative who has indicated his/her understanding and acceptance.   Dental advisory given  Plan Discussed with: CRNA,  Anesthesiologist and Surgeon  Anesthesia Plan Comments:        Anesthesia Quick Evaluation

## 2017-09-28 ENCOUNTER — Ambulatory Visit (HOSPITAL_BASED_OUTPATIENT_CLINIC_OR_DEPARTMENT_OTHER): Payer: Medicare Other | Admitting: Anesthesiology

## 2017-09-28 ENCOUNTER — Other Ambulatory Visit: Payer: Self-pay

## 2017-09-28 ENCOUNTER — Encounter (HOSPITAL_COMMUNITY)
Admission: RE | Disposition: A | Discharge status: Home / Self Care | Payer: Self-pay | Source: Ambulatory Visit | Attending: Urology

## 2017-09-28 ENCOUNTER — Encounter (HOSPITAL_BASED_OUTPATIENT_CLINIC_OR_DEPARTMENT_OTHER): Payer: Self-pay | Admitting: *Deleted

## 2017-09-28 ENCOUNTER — Observation Stay (HOSPITAL_BASED_OUTPATIENT_CLINIC_OR_DEPARTMENT_OTHER)
Admission: RE | Admit: 2017-09-28 | Discharge: 2017-09-29 | Disposition: A | Discharge status: Home / Self Care | Payer: Medicare Other | Source: Ambulatory Visit | Attending: Urology | Admitting: Urology

## 2017-09-28 DIAGNOSIS — N401 Enlarged prostate with lower urinary tract symptoms: Principal | ICD-10-CM | POA: Diagnosis present

## 2017-09-28 DIAGNOSIS — K219 Gastro-esophageal reflux disease without esophagitis: Secondary | ICD-10-CM | POA: Diagnosis not present

## 2017-09-28 DIAGNOSIS — E119 Type 2 diabetes mellitus without complications: Secondary | ICD-10-CM | POA: Insufficient documentation

## 2017-09-28 DIAGNOSIS — Z79899 Other long term (current) drug therapy: Secondary | ICD-10-CM | POA: Insufficient documentation

## 2017-09-28 DIAGNOSIS — I10 Essential (primary) hypertension: Secondary | ICD-10-CM | POA: Insufficient documentation

## 2017-09-28 DIAGNOSIS — M1990 Unspecified osteoarthritis, unspecified site: Secondary | ICD-10-CM | POA: Insufficient documentation

## 2017-09-28 DIAGNOSIS — R338 Other retention of urine: Secondary | ICD-10-CM | POA: Diagnosis present

## 2017-09-28 DIAGNOSIS — Z8042 Family history of malignant neoplasm of prostate: Secondary | ICD-10-CM | POA: Diagnosis not present

## 2017-09-28 DIAGNOSIS — R3914 Feeling of incomplete bladder emptying: Secondary | ICD-10-CM | POA: Insufficient documentation

## 2017-09-28 DIAGNOSIS — R3912 Poor urinary stream: Secondary | ICD-10-CM | POA: Diagnosis not present

## 2017-09-28 DIAGNOSIS — R351 Nocturia: Secondary | ICD-10-CM | POA: Insufficient documentation

## 2017-09-28 DIAGNOSIS — Z9889 Other specified postprocedural states: Secondary | ICD-10-CM | POA: Diagnosis not present

## 2017-09-28 DIAGNOSIS — Z7984 Long term (current) use of oral hypoglycemic drugs: Secondary | ICD-10-CM | POA: Insufficient documentation

## 2017-09-28 DIAGNOSIS — Z87891 Personal history of nicotine dependence: Secondary | ICD-10-CM | POA: Diagnosis not present

## 2017-09-28 HISTORY — DX: Dizziness and giddiness: R42

## 2017-09-28 HISTORY — DX: Benign prostatic hyperplasia with lower urinary tract symptoms: N40.1

## 2017-09-28 HISTORY — DX: Personal history of other diseases of male genital organs: Z87.438

## 2017-09-28 HISTORY — DX: Presence of urogenital implants: Z96.0

## 2017-09-28 HISTORY — DX: Other obstructive and reflux uropathy: N13.8

## 2017-09-28 HISTORY — DX: Presence of dental prosthetic device (complete) (partial): Z97.2

## 2017-09-28 HISTORY — DX: Personal history of other infectious and parasitic diseases: Z86.19

## 2017-09-28 HISTORY — DX: Personal history of traumatic brain injury: Z87.820

## 2017-09-28 HISTORY — PX: TRANSURETHRAL RESECTION OF PROSTATE: SHX73

## 2017-09-28 HISTORY — DX: Personal history of other specified conditions: Z87.898

## 2017-09-28 HISTORY — DX: Type 2 diabetes mellitus without complications: E11.9

## 2017-09-28 HISTORY — DX: Unspecified osteoarthritis, unspecified site: M19.90

## 2017-09-28 HISTORY — DX: Retention of urine, unspecified: R33.9

## 2017-09-28 LAB — GLUCOSE, CAPILLARY
GLUCOSE-CAPILLARY: 113 mg/dL — AB (ref 65–99)
GLUCOSE-CAPILLARY: 174 mg/dL — AB (ref 65–99)
GLUCOSE-CAPILLARY: 154 mg/dL — AB (ref 65–99)
Glucose-Capillary: 156 mg/dL — ABNORMAL HIGH (ref 65–99)

## 2017-09-28 LAB — TYPE AND SCREEN
ABO/RH(D): O POS
Antibody Screen: NEGATIVE

## 2017-09-28 LAB — CBC
HEMATOCRIT: 41.1 % (ref 39.0–52.0)
Hemoglobin: 13.9 g/dL (ref 13.0–17.0)
MCH: 32.9 pg (ref 26.0–34.0)
MCHC: 33.8 g/dL (ref 30.0–36.0)
MCV: 97.2 fL (ref 78.0–100.0)
Platelets: 218 10*3/uL (ref 150–400)
RBC: 4.23 MIL/uL (ref 4.22–5.81)
RDW: 12.7 % (ref 11.5–15.5)
WBC: 11.7 10*3/uL — ABNORMAL HIGH (ref 4.0–10.5)

## 2017-09-28 LAB — POCT I-STAT, CHEM 8
BUN: 22 mg/dL — AB (ref 6–20)
CALCIUM ION: 1.5 mmol/L — AB (ref 1.15–1.40)
CHLORIDE: 102 mmol/L (ref 101–111)
Creatinine, Ser: 0.9 mg/dL (ref 0.61–1.24)
Glucose, Bld: 137 mg/dL — ABNORMAL HIGH (ref 65–99)
HCT: 43 % (ref 39.0–52.0)
Hemoglobin: 14.6 g/dL (ref 13.0–17.0)
Potassium: 4.2 mmol/L (ref 3.5–5.1)
SODIUM: 140 mmol/L (ref 135–145)
TCO2: 27 mmol/L (ref 22–32)

## 2017-09-28 LAB — HEMOGLOBIN AND HEMATOCRIT, BLOOD
HEMATOCRIT: 39.9 % (ref 39.0–52.0)
Hemoglobin: 13.3 g/dL (ref 13.0–17.0)

## 2017-09-28 LAB — ABO/RH: ABO/RH(D): O POS

## 2017-09-28 SURGERY — TURP (TRANSURETHRAL RESECTION OF PROSTATE)
Anesthesia: General | Site: Prostate

## 2017-09-28 MED ORDER — TAMSULOSIN HCL 0.4 MG PO CAPS
0.4000 mg | ORAL_CAPSULE | Freq: Every day | ORAL | Status: DC
  Administered 2017-09-28: 0.4 mg via ORAL
  Filled 2017-09-28: qty 1

## 2017-09-28 MED ORDER — RISAQUAD PO CAPS
1.0000 | ORAL_CAPSULE | Freq: Every day | ORAL | Status: DC
  Administered 2017-09-28 – 2017-09-29 (×2): 1 via ORAL
  Filled 2017-09-28 (×2): qty 1

## 2017-09-28 MED ORDER — INSULIN ASPART 100 UNIT/ML ~~LOC~~ SOLN: 0.0000 [IU] | SUBCUTANEOUS | Status: DC

## 2017-09-28 MED ORDER — DIPHENHYDRAMINE HCL 50 MG/ML IJ SOLN: 12.5000 mg | Freq: Four times a day (QID) | INTRAMUSCULAR | Status: DC | PRN

## 2017-09-28 MED ORDER — LISINOPRIL 20 MG PO TABS
40.0000 mg | ORAL_TABLET | Freq: Every morning | ORAL | Status: DC
  Administered 2017-09-29: 40 mg via ORAL
  Filled 2017-09-28: qty 2

## 2017-09-28 MED ORDER — DEXAMETHASONE SODIUM PHOSPHATE 10 MG/ML IJ SOLN
INTRAMUSCULAR | Status: AC
  Filled 2017-09-28: qty 1

## 2017-09-28 MED ORDER — KETOROLAC TROMETHAMINE 30 MG/ML IJ SOLN
INTRAMUSCULAR | Status: AC
  Filled 2017-09-28: qty 1

## 2017-09-28 MED ORDER — LACTATED RINGERS IV SOLN
INTRAVENOUS | Status: DC
  Administered 2017-09-28: 07:00:00 via INTRAVENOUS
  Administered 2017-09-28: 1000 mL via INTRAVENOUS
  Filled 2017-09-28: qty 1000

## 2017-09-28 MED ORDER — ACETAMINOPHEN 325 MG PO TABS
650.0000 mg | ORAL_TABLET | ORAL | Status: DC | PRN
  Administered 2017-09-29: 650 mg via ORAL

## 2017-09-28 MED ORDER — ATORVASTATIN CALCIUM 10 MG PO TABS
5.0000 mg | ORAL_TABLET | Freq: Every morning | ORAL | Status: DC
  Administered 2017-09-29: 5 mg via ORAL
  Filled 2017-09-28: qty 1

## 2017-09-28 MED ORDER — PHENYLEPHRINE HCL 10 MG/ML IJ SOLN
INTRAMUSCULAR | Status: DC | PRN
  Administered 2017-09-28 (×2): 80 ug via INTRAVENOUS

## 2017-09-28 MED ORDER — SODIUM CHLORIDE 0.9 % IR SOLN
Status: DC | PRN
  Administered 2017-09-28: 3000 mL via INTRAVESICAL
  Administered 2017-09-28 (×4): 6000 mL via INTRAVESICAL

## 2017-09-28 MED ORDER — HYDROCODONE-ACETAMINOPHEN 5-325 MG PO TABS: 1.0000 | ORAL_TABLET | ORAL | Status: DC | PRN

## 2017-09-28 MED ORDER — VITAMIN D3 25 MCG (1000 UNIT) PO TABS
1000.0000 [IU] | ORAL_TABLET | Freq: Every day | ORAL | Status: DC
  Administered 2017-09-28 – 2017-09-29 (×2): 1000 [IU] via ORAL
  Filled 2017-09-28 (×2): qty 1

## 2017-09-28 MED ORDER — CEFAZOLIN SODIUM-DEXTROSE 2-4 GM/100ML-% IV SOLN
INTRAVENOUS | Status: AC
  Filled 2017-09-28: qty 100

## 2017-09-28 MED ORDER — ONDANSETRON 4 MG PO TBDP: 4.0000 mg | ORAL_TABLET | Freq: Three times a day (TID) | ORAL | Status: DC | PRN

## 2017-09-28 MED ORDER — FENTANYL CITRATE (PF) 100 MCG/2ML IJ SOLN
INTRAMUSCULAR | Status: AC
  Filled 2017-09-28: qty 2

## 2017-09-28 MED ORDER — PHENYLEPHRINE 40 MCG/ML (10ML) SYRINGE FOR IV PUSH (FOR BLOOD PRESSURE SUPPORT)
PREFILLED_SYRINGE | INTRAVENOUS | Status: AC
  Filled 2017-09-28: qty 10

## 2017-09-28 MED ORDER — FENTANYL CITRATE (PF) 100 MCG/2ML IJ SOLN
INTRAMUSCULAR | Status: DC | PRN
  Administered 2017-09-28 (×4): 25 ug via INTRAVENOUS

## 2017-09-28 MED ORDER — LIDOCAINE 2% (20 MG/ML) 5 ML SYRINGE
INTRAMUSCULAR | Status: DC | PRN
  Administered 2017-09-28: 60 mg via INTRAVENOUS

## 2017-09-28 MED ORDER — SENNOSIDES-DOCUSATE SODIUM 8.6-50 MG PO TABS
2.0000 | ORAL_TABLET | Freq: Every day | ORAL | Status: DC
  Administered 2017-09-28: 2 via ORAL
  Filled 2017-09-28: qty 2

## 2017-09-28 MED ORDER — DIPHENHYDRAMINE HCL 12.5 MG/5ML PO ELIX: 12.5000 mg | ORAL_SOLUTION | Freq: Four times a day (QID) | ORAL | Status: DC | PRN

## 2017-09-28 MED ORDER — ONDANSETRON HCL 4 MG/2ML IJ SOLN
4.0000 mg | Freq: Once | INTRAMUSCULAR | Status: DC | PRN
  Filled 2017-09-28: qty 2

## 2017-09-28 MED ORDER — OXYBUTYNIN CHLORIDE 5 MG PO TABS: 5.0000 mg | ORAL_TABLET | Freq: Three times a day (TID) | ORAL | Status: DC | PRN

## 2017-09-28 MED ORDER — PANTOPRAZOLE SODIUM 20 MG PO TBEC
20.0000 mg | DELAYED_RELEASE_TABLET | Freq: Two times a day (BID) | ORAL | Status: DC
  Administered 2017-09-28 – 2017-09-29 (×2): 20 mg via ORAL
  Filled 2017-09-28 (×2): qty 1

## 2017-09-28 MED ORDER — CEFAZOLIN SODIUM-DEXTROSE 2-4 GM/100ML-% IV SOLN
2.0000 g | INTRAVENOUS | Status: AC
  Administered 2017-09-28: 2 g via INTRAVENOUS
  Filled 2017-09-28: qty 100

## 2017-09-28 MED ORDER — ONDANSETRON HCL 4 MG/2ML IJ SOLN
INTRAMUSCULAR | Status: AC
  Filled 2017-09-28: qty 2

## 2017-09-28 MED ORDER — HYDROCODONE-ACETAMINOPHEN 5-325 MG PO TABS: 1.0000 | ORAL_TABLET | ORAL | 0 refills | Status: DC | PRN

## 2017-09-28 MED ORDER — HYDROCHLOROTHIAZIDE 25 MG PO TABS
25.0000 mg | ORAL_TABLET | Freq: Every morning | ORAL | Status: DC
  Administered 2017-09-29: 25 mg via ORAL
  Filled 2017-09-28: qty 1

## 2017-09-28 MED ORDER — ONDANSETRON HCL 4 MG/2ML IJ SOLN
INTRAMUSCULAR | Status: DC | PRN
  Administered 2017-09-28: 4 mg via INTRAVENOUS

## 2017-09-28 MED ORDER — MORPHINE SULFATE (PF) 4 MG/ML IV SOLN: 2.0000 mg | INTRAVENOUS | Status: DC | PRN

## 2017-09-28 MED ORDER — DEXAMETHASONE SODIUM PHOSPHATE 10 MG/ML IJ SOLN
INTRAMUSCULAR | Status: DC | PRN
  Administered 2017-09-28: 10 mg via INTRAVENOUS

## 2017-09-28 MED ORDER — ONDANSETRON HCL 4 MG/2ML IJ SOLN: 4.0000 mg | INTRAMUSCULAR | Status: DC | PRN

## 2017-09-28 MED ORDER — PROPOFOL 10 MG/ML IV BOLUS
INTRAVENOUS | Status: DC | PRN
  Administered 2017-09-28: 40 mg via INTRAVENOUS
  Administered 2017-09-28: 50 mg via INTRAVENOUS
  Administered 2017-09-28: 110 mg via INTRAVENOUS

## 2017-09-28 MED ORDER — SODIUM CHLORIDE 0.9 % IV SOLN
INTRAVENOUS | Status: DC
  Administered 2017-09-28: 18:00:00 via INTRAVENOUS

## 2017-09-28 MED ORDER — FENTANYL CITRATE (PF) 100 MCG/2ML IJ SOLN
25.0000 ug | INTRAMUSCULAR | Status: DC | PRN
  Filled 2017-09-28: qty 1

## 2017-09-28 MED ORDER — BELLADONNA ALKALOIDS-OPIUM 16.2-60 MG RE SUPP: 1.0000 | Freq: Four times a day (QID) | RECTAL | Status: DC | PRN

## 2017-09-28 MED ORDER — PROPOFOL 10 MG/ML IV BOLUS
INTRAVENOUS | Status: AC
  Filled 2017-09-28: qty 20

## 2017-09-28 MED ORDER — LIDOCAINE 2% (20 MG/ML) 5 ML SYRINGE
INTRAMUSCULAR | Status: AC
Start: 2017-09-28 — End: ?
  Filled 2017-09-28: qty 5

## 2017-09-28 MED ORDER — MEPERIDINE HCL 25 MG/ML IJ SOLN
6.2500 mg | INTRAMUSCULAR | Status: DC | PRN
  Filled 2017-09-28: qty 1

## 2017-09-28 SURGICAL SUPPLY — 20 items
BAG DRAIN URO-CYSTO SKYTR STRL (DRAIN) ×3 IMPLANT
BAG DRN UROCATH (DRAIN) ×1
BAG URINE DRAINAGE (UROLOGICAL SUPPLIES) ×3 IMPLANT
CATH FOLEY 2WAY SLVR  5CC 22FR (CATHETERS)
CATH FOLEY 2WAY SLVR 5CC 22FR (CATHETERS) IMPLANT
CATH HEMA 3WAY 30CC 22FR COUDE (CATHETERS) ×2 IMPLANT
CATH HEMA 3WAY 30CC 24FR RND (CATHETERS) IMPLANT
CLOTH BEACON ORANGE TIMEOUT ST (SAFETY) ×3 IMPLANT
COVER FOOTSWITCH UNIV (MISCELLANEOUS) ×3 IMPLANT
GLOVE BIO SURGEON STRL SZ7.5 (GLOVE) ×3 IMPLANT
GOWN STRL REUS W/TWL XL LVL3 (GOWN DISPOSABLE) ×3 IMPLANT
HOLDER FOLEY CATH W/STRAP (MISCELLANEOUS) ×3 IMPLANT
IV NS IRRIG 3000ML ARTHROMATIC (IV SOLUTION) ×14 IMPLANT
LOOP CUT BIPOLAR 24F LRG (ELECTROSURGICAL) ×3 IMPLANT
MANIFOLD NEPTUNE II (INSTRUMENTS) ×3 IMPLANT
PACK CYSTO (CUSTOM PROCEDURE TRAY) ×3 IMPLANT
SYR 30ML LL (SYRINGE) ×2 IMPLANT
SYRINGE IRR TOOMEY STRL 70CC (SYRINGE) ×3 IMPLANT
TUBE CONNECTING 12'X1/4 (SUCTIONS) ×1
TUBE CONNECTING 12X1/4 (SUCTIONS) ×2 IMPLANT

## 2017-09-28 NOTE — Interval H&P Note (Signed)
History and Physical Interval Note:  09/28/2017 8:28 AM  Robert Gross  has presented today for surgery, with the diagnosis of URINARY RETENTION  The various methods of treatment have been discussed with the patient and family. After consideration of risks, benefits and other options for treatment, the patient has consented to  Procedure(s): [facility] (TURP) (N/A) as a surgical intervention .  The patient's history has been reviewed, patient examined, no change in status, stable for surgery.  I have reviewed the patient's chart and labs.  Questions were answered to the patient's satisfaction.     [name], [name]

## 2017-09-28 NOTE — Transfer of Care (Signed)
Immediate Anesthesia Transfer of Care Note  Patient: Robert Gross  Procedure(s) Performed: BIPOLAR TRANSURETHRAL RESECTION OF THE PROSTATE (TURP) (N/A Prostate)  Patient Location: PACU  Anesthesia Type:General  Level of Consciousness: awake, alert , oriented and patient cooperative  Airway & Oxygen Therapy: Patient Spontanous Breathing and Patient connected to nasal cannula oxygen  Post-op Assessment: Report given to RN and Post -op Vital signs reviewed and stable  Post vital signs: Reviewed and stable  Last Vitals:  Vitals:   09/28/17 0628  BP: 128/60  Pulse: 85  Resp: 17  Temp: 36.4 C  SpO2: 97%    Last Pain:  Vitals:   09/28/17 0701  TempSrc:   PainSc: 0-No pain         Complications: No apparent anesthesia complications

## 2017-09-28 NOTE — Anesthesia Postprocedure Evaluation (Signed)
Anesthesia Post Note  Patient: Robert Gross  Procedure(s) Performed: BIPOLAR TRANSURETHRAL RESECTION OF THE PROSTATE (TURP) (N/A Prostate)     Patient location during evaluation: PACU Anesthesia Type: General Level of consciousness: awake and alert Pain management: pain level controlled Vital Signs Assessment: post-procedure vital signs reviewed and stable Respiratory status: spontaneous breathing, nonlabored ventilation and respiratory function stable Cardiovascular status: blood pressure returned to baseline and stable Postop Assessment: no apparent nausea or vomiting Anesthetic complications: no    Last Vitals:  Vitals:   09/28/17 1045 09/28/17 1100  BP: 134/75 121/64  Pulse: 92 88  Resp: 15 14  Temp:    SpO2: 98% 95%    Last Pain:  Vitals:   09/28/17 0701  TempSrc:   PainSc: 0-No pain                 Byrne Capek A.

## 2017-09-28 NOTE — Anesthesia Procedure Notes (Signed)
Procedure Name: LMA Insertion Date/Time: 09/28/2017 8:36 AM Performed by: Wanita Chamberlain, CRNA Pre-anesthesia Checklist: Patient identified, Timeout performed, Emergency Drugs available, Suction available and Patient being monitored Patient Re-evaluated:Patient Re-evaluated prior to induction Oxygen Delivery Method: Circle system utilized Preoxygenation: Pre-oxygenation with 100% oxygen Induction Type: IV induction Ventilation: Mask ventilation without difficulty LMA: LMA inserted LMA Size: 4.0 Number of attempts: 1 Placement Confirmation: breath sounds checked- equal and bilateral and CO2 detector Tube secured with: Tape Dental Injury: Teeth and Oropharynx as per pre-operative assessment

## 2017-09-28 NOTE — Op Note (Signed)
Operative Note  Preoperative diagnosis:  1.  Urinary retention secondary to benign prostatic hyperplasia  Postoperative diagnosis: 1.  Urinary retention secondary to benign prostatic hyperplasia  Procedure(s): 1.  Transurethral resection of prostate  Surgeon: Link Snuffer, MD  Assistants: None  Anesthesia: General  Complications: None immediate  EBL: 50 cc  Specimens: 1.  Prostate chips  Drains/Catheters: 1.  59 French three-way catheter with continuous bladder irrigation running  Intraoperative findings: 1 normal anterior urethra 2.  Obstructing prostate with evidence of prior transurethral resection with significant prostatic regrowth. 3.  Normal bladder mucosa  Indication: 83 year old male with a history of BPH status post TURP in the past.  He initially did well after this but recently developed urinary retention.  Cystoscopy revealed prostatic regrowth and he presents for the after mentioned operation.  Description of procedure:  The patient was identified and consent was obtained.  He was taken operating room and placed in the supine position.  He was placed under general anesthesia.  He was converted to dorsal lithotomy.  Perioperative antibiotics were administered.  He was prepped and draped in standard sterile fashion.  A timeout was performed.  I first advanced a 12 French resectoscope with visual obturator in place into the urethra and into the bladder and exchange this for the working element.  I proceeded to use bipolar settings to take down the median lobe followed by the lateral lobes all the way down to the area of the verumontanum taking care not to extend the resection beyond the verumontanum.  I visualized bilateral ureteral orifices throughout the case to ensure that the resection was well away from these.  Also visualized them at the conclusion of the case.  I obtained hemostasis with electrocautery.  I evacuated all chips and collected them for specimen.  After  hemostasis was adequate and there was a wide open channel, I withdrew the scope and placed a 22 [place] 3-way urethral catheter and started continuous bladder irrigation with normal saline.  This concluded the operation.  The patient tolerated procedure well and was stable postoperatively.  Plan: The patient will remain in the hospital overnight.  We will titrate his continuous bladder irrigation.  He will return in 1 week for voiding trial.

## 2017-09-28 NOTE — Discharge Instructions (Signed)

## 2017-09-28 NOTE — H&P (Signed)
CC: I have symptoms of an enlarged prostate.  HPI: Robert Gross is a 83 year-old male patient who was referred by Dr. [name], MD who is here for symptoms of enlarged prostate.  He first noticed the symptoms approximately 04/22/2017. His symptoms have gotten worse over the last year. He has not been treated with medications in the past for his lower urinary tract symptoms. The patient has had a TURP.   He usually gets up at night to urinate 3 times. He does have to wait a long time to start his urinary stream. He does have to strain or bear down to start his urinary stream. He does not have a good size and strength to his urinary stream. He does dribble at the end of urination. He is having problems with emptying his bladder well.   His urine has shut off completely. He has not previously had an indwelling catheter in for more than two weeks at a time.   Family members diagnosed with prostate cancer include his father.   The patient was recently admitted with urinary retention 2 days ago. He still has a Foley catheter. He was not started on any medication for his prostate. He has a history of TURP in 2001. For the past 4 months his urinary symptoms have progressively worsened until he went into full urinary retention a couple of days ago.     ALLERGIES: None   MEDICATIONS: Hydrochlorothiazide 25 mg tablet  Lisinopril 40 mg tablet  Metformin Hcl 500 mg tablet  Atorvastatin Calcium 10 mg tablet  Pantoprazole Sodium 20 mg tablet, delayed release     GU PSH: Cystoscopy TURP    NON-GU PSH: Cataract surgery Hernia Repair Knee Arthroscopy    GU PMH: None   NON-GU PMH: Diabetes Type 2 GERD Hypertension    FAMILY HISTORY: prostate cancer in father - Father   SOCIAL HISTORY: Marital Status: Married Current Smoking Status: Patient smokes. Has smoked since 04/14/2017.   Tobacco Use Assessment Completed: Used Tobacco in last 30 days? Does drink.  Drinks 4+ caffeinated drinks  per day. 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     Notes: smokes a pipe    REVIEW OF SYSTEMS:    GU Review Male:   Patient denies frequent urination, hard to postpone urination, burning/ pain with urination, get up at night to urinate, leakage of urine, stream starts and stops, trouble starting your stream, have to strain to urinate , erection problems, and penile pain.  Gastrointestinal (Upper):   Patient reports nausea and indigestion/ heartburn. Patient denies vomiting.  Gastrointestinal (Lower):   Patient reports diarrhea. Patient denies constipation.  Constitutional:   Patient denies fever, night sweats, weight loss, and fatigue.  Skin:   Patient denies skin rash/ lesion and itching.  Eyes:   Patient denies blurred vision and double vision.  Ears/ Nose/ Throat:   Patient denies sore throat and sinus problems.  Hematologic/Lymphatic:   Patient denies swollen glands and easy bruising.  Cardiovascular:   Patient denies leg swelling and chest pains.  Respiratory:   Patient denies cough and shortness of breath.  Endocrine:   Patient denies excessive thirst.  Musculoskeletal:   Patient denies joint pain and back pain.  Neurological:   Patient reports dizziness. Patient denies headaches.  Psychologic:   Patient denies depression and anxiety.   VITAL SIGNS:      09/14/2017 01:46 PM  Weight 138 lb / 62.6 kg  Height 67 in / 170.18 cm  BP 141/58 mmHg  Pulse 86 /min  Temperature 97.2 F / 36.2 C  BMI 21.6 kg/m   GU PHYSICAL EXAMINATION:    Penis: Foley catheter draining clear yellow urine   MULTI-SYSTEM PHYSICAL EXAMINATION:    Constitutional: Well-nourished. No physical deformities. Normally developed. Good grooming.  Respiratory: No labored breathing, no use of accessory muscles.   Cardiovascular: Normal temperature, adequate peripheral perfusion  Skin: No paleness, no jaundice,  Neurologic / Psychiatric: Oriented to time, oriented to place, oriented to person. No depression,  no anxiety, no agitation.  Gastrointestinal: No mass, no tenderness, no rigidity, non obese abdomen.  Eyes: Normal conjunctivae. Normal eyelids.  Musculoskeletal: Normal gait and station of head and neck.     PAST DATA REVIEWED:  Source Of History:  Patient  Records Review:   Previous Doctor Records   09/29/04  PSA  Total PSA 2.39     PROCEDURES: None   ASSESSMENT:      ICD-10 Details  1 GU:   BPH w/LUTS - N40.1   2   Urinary Retention - R33.8    PLAN:            Medications New Meds: Tamsulosin Hcl 0.4 mg capsule, ext release 24 hr 1 capsule PO Q PM   #30  3 Refill(s)            Schedule Return Visit/Planned Activity: 1 Week - Follow up MD             Note: Please make first thing in the morning so that he can have a voiding trial.          Document Letter(s):  Created for Patient: Clinical Summary         Notes:   As a first-line treatment option for his lower urinary tract symptoms have discussed conservative therapy such as behavioral management as well as pharmacological options including alpha blockers. The patient has elected to undergo trial of an alpha blocker. He understands the potential serious adverse reactions including allergic reaction, syncopal episodes, orthostatic hypotension as well as other side effects such as the potential of retrograde ejaculation.   He'll return in 1 week for voiding trial. If he does not pass we will do a cystoscopy for planning of potential surgical intervention.    Signed by Link Snuffer, [name], M.D. on 09/14/17 at 2:26 PM (EDT

## 2017-09-29 ENCOUNTER — Encounter (HOSPITAL_BASED_OUTPATIENT_CLINIC_OR_DEPARTMENT_OTHER): Payer: Self-pay | Admitting: Urology

## 2017-09-29 DIAGNOSIS — N401 Enlarged prostate with lower urinary tract symptoms: Secondary | ICD-10-CM | POA: Diagnosis not present

## 2017-09-29 LAB — HEMOGLOBIN AND HEMATOCRIT, BLOOD
HCT: 36.8 % — ABNORMAL LOW (ref 39.0–52.0)
HEMOGLOBIN: 12.2 g/dL — AB (ref 13.0–17.0)

## 2017-09-29 LAB — GLUCOSE, CAPILLARY
Glucose-Capillary: 123 mg/dL — ABNORMAL HIGH (ref 65–99)
Glucose-Capillary: 116 mg/dL — ABNORMAL HIGH (ref 65–99)

## 2017-09-29 NOTE — Plan of Care (Signed)
Pt prepared for discharge by 1230 pm per MD

## 2017-09-29 NOTE — Discharge Summary (Signed)
[facility] Discharge Summary  Admit date: 09/28/2017  Discharge date and time: 09/29/17   Discharge to: Home  Discharge Service: Urology  Discharge Attending Physician:  Winter  Discharge  Diagnoses: BPH  Secondary Diagnosis: Active Problems:   Urinary retention due to benign prostatic hyperplasia   OR Procedures: Procedure(s): BIPOLAR TRANSURETHRAL RESECTION OF THE PROSTATE (TURP) 09/28/2017   Ancillary Procedures: None   Discharge Day Services: The patient was seen and examined by the Urology team both in the morning and immediately prior to discharge.  Vital signs and laboratory values were stable and within normal limits.  The physical exam was benign and unchanged and all surgical wounds were examined.  Discharge instructions were explained and all questions answered.  Subjective  No acute events overnight. Pain Controlled. No fever or chills.  Objective Patient Vitals for the past 8 hrs:  BP Temp Temp src Pulse Resp SpO2  09/29/17 0435 112/66 (!) 97.5 F (36.4 C) Oral 87 17 93 %   No intake/output data recorded.  General Appearance:        No acute distress Lungs:                       Normal work of breathing on room air Heart:                                Regular rate and rhythm Abdomen:                         Soft, non-tender, non-distended GU:    Catheter in place, urine clear  Extremities:                      Warm and well perfused   Hospital Course:  The patient underwent a TURP on 09/28/2017.  The patient tolerated the procedure well, was extubated in the OR, and afterwards was taken to the PACU for routine post-surgical care. When stable the patient was transferred to the floor.   The patient did well postoperatively.  The patient's diet was slowly advanced and at the time of discharge was tolerating a regular diet. CBI clamped on morning of POD1. Continued to be clear, discharged later that evening.   The patient was discharged home 1 Day Post-Op,  at which point was tolerating a regular solid diet, was able to void spontaneously, have adequate pain control with P.O. pain medication, and could ambulate without difficulty. The patient will follow up with [place] for post op check.   Follow up for catheter removal   Condition at Discharge: Improved  Discharge Medications:  Allergies as of 09/29/2017   No Known Allergies     Medication List    STOP taking these medications   tamsulosin 0.4 MG Caps capsule Commonly known as:  FLOMAX     TAKE these medications   ALIGN 4 MG Caps Take 1 capsule daily by mouth.   atorvastatin 10 MG tablet Commonly known as:  LIPITOR Take 5 mg every morning by mouth.   CALCIUM-MAGNESIUM-ZINC PO Take 1 tablet by mouth every morning.   cholecalciferol 1000 units tablet Commonly known as:  VITAMIN D Take 1,000 Units by mouth daily.   hydrochlorothiazide 25 MG tablet Commonly known as:  HYDRODIURIL Take 25 mg every morning by mouth.   HYDROcodone-acetaminophen 5-325 MG tablet Commonly known as:  NORCO/VICODIN Take 1 tablet every  4 (four) hours as needed by mouth for moderate pain.   lisinopril 40 MG tablet Commonly known as:  PRINIVIL,ZESTRIL Take 40 mg every morning by mouth.   metFORMIN 500 MG tablet Commonly known as:  GLUCOPHAGE Take 2 (two) times daily with a meal by mouth. 1000 mg in the morning and 500 mg in the evening   ondansetron 4 MG disintegrating tablet Commonly known as:  ZOFRAN-ODT Take 1 tablet (4 mg total) by mouth every 8 (eight) hours as needed for nausea or vomiting.   pantoprazole 20 MG tablet Commonly known as:  PROTONIX Take 20 mg 2 (two) times daily by mouth.

## 2017-09-29 NOTE — Progress Notes (Signed)
Pt discharged to home, instructions reviewed acknowledged understanding . Wife at bedside to take pt hom. SRP, RN

## 2017-11-17 ENCOUNTER — Other Ambulatory Visit: Payer: Medicare Other

## 2017-11-17 ENCOUNTER — Ambulatory Visit
Admission: RE | Admit: 2017-11-17 | Discharge: 2017-11-17 | Disposition: A | Discharge status: Home / Self Care | Payer: Medicare Other | Source: Ambulatory Visit | Attending: Internal Medicine | Admitting: Internal Medicine

## 2017-11-17 DIAGNOSIS — K118 Other diseases of salivary glands: Secondary | ICD-10-CM

## 2017-11-17 MED ORDER — IOPAMIDOL (ISOVUE-300) INJECTION 61%
75.0000 mL | Freq: Once | INTRAVENOUS | Status: AC | PRN
  Administered 2017-11-17: 75 mL via INTRAVENOUS

## 2018-07-29 ENCOUNTER — Encounter (HOSPITAL_COMMUNITY): Payer: Self-pay

## 2018-07-29 ENCOUNTER — Other Ambulatory Visit: Payer: Self-pay

## 2018-07-29 ENCOUNTER — Emergency Department (HOSPITAL_COMMUNITY): Payer: Medicare Other

## 2018-07-29 ENCOUNTER — Emergency Department (HOSPITAL_COMMUNITY)
Admission: EM | Admit: 2018-07-29 | Discharge: 2018-07-29 | Disposition: A | Discharge status: Home / Self Care | Payer: Medicare Other | Attending: Emergency Medicine | Admitting: Emergency Medicine

## 2018-07-29 DIAGNOSIS — E119 Type 2 diabetes mellitus without complications: Secondary | ICD-10-CM | POA: Diagnosis not present

## 2018-07-29 DIAGNOSIS — Y999 Unspecified external cause status: Secondary | ICD-10-CM | POA: Insufficient documentation

## 2018-07-29 DIAGNOSIS — N390 Urinary tract infection, site not specified: Secondary | ICD-10-CM | POA: Diagnosis not present

## 2018-07-29 DIAGNOSIS — Z79899 Other long term (current) drug therapy: Secondary | ICD-10-CM | POA: Diagnosis not present

## 2018-07-29 DIAGNOSIS — W0110XA Fall on same level from slipping, tripping and stumbling with subsequent striking against unspecified object, initial encounter: Secondary | ICD-10-CM | POA: Diagnosis not present

## 2018-07-29 DIAGNOSIS — Y929 Unspecified place or not applicable: Secondary | ICD-10-CM | POA: Diagnosis not present

## 2018-07-29 DIAGNOSIS — Z7984 Long term (current) use of oral hypoglycemic drugs: Secondary | ICD-10-CM | POA: Diagnosis not present

## 2018-07-29 DIAGNOSIS — I10 Essential (primary) hypertension: Secondary | ICD-10-CM | POA: Insufficient documentation

## 2018-07-29 DIAGNOSIS — Z87891 Personal history of nicotine dependence: Secondary | ICD-10-CM | POA: Insufficient documentation

## 2018-07-29 DIAGNOSIS — Y9301 Activity, walking, marching and hiking: Secondary | ICD-10-CM | POA: Insufficient documentation

## 2018-07-29 DIAGNOSIS — S0101XA Laceration without foreign body of scalp, initial encounter: Secondary | ICD-10-CM | POA: Diagnosis not present

## 2018-07-29 DIAGNOSIS — S0990XA Unspecified injury of head, initial encounter: Secondary | ICD-10-CM | POA: Diagnosis present

## 2018-07-29 LAB — URINALYSIS, ROUTINE W REFLEX MICROSCOPIC
Bilirubin Urine: NEGATIVE
Glucose, UA: 50 mg/dL — AB
KETONES UR: NEGATIVE mg/dL
Nitrite: NEGATIVE
PROTEIN: NEGATIVE mg/dL
Specific Gravity, Urine: 1.019 (ref 1.005–1.030)
pH: 5 (ref 5.0–8.0)

## 2018-07-29 MED ORDER — ACETAMINOPHEN 500 MG PO TABS: 500.0000 mg | ORAL_TABLET | Freq: Four times a day (QID) | ORAL | 0 refills | Status: DC | PRN

## 2018-07-29 MED ORDER — LIDOCAINE-EPINEPHRINE-TETRACAINE (LET) SOLUTION
3.0000 mL | Freq: Once | NASAL | Status: AC
  Administered 2018-07-29: 3 mL via TOPICAL
  Filled 2018-07-29: qty 3

## 2018-07-29 MED ORDER — CEPHALEXIN 500 MG PO CAPS: 500.0000 mg | ORAL_CAPSULE | Freq: Three times a day (TID) | ORAL | 0 refills | Status: DC

## 2018-07-29 NOTE — Discharge Instructions (Addendum)
Please cleanse wound daily and monitor for any evidence of infection.  Take tylenol as needed for pain.  Follow up with your doctor in 5-7 days for staples removal. Your urine shows sign of a urinary tract infection.  Please take antibiotic as prescribed.  Return if you have any concerns.

## 2018-07-29 NOTE — ED Triage Notes (Signed)
Patient reports that he stumbled on a concrete step and has a laceration to the left forehead and left top of his head.  Patient also c/o non productive cough x 3 weeks.

## 2018-07-29 NOTE — ED Provider Notes (Signed)
[facility] DEPT Provider Note   CSN: [id number] Arrival date & time: 07/29/18  1245     History   Chief Complaint Chief Complaint  Patient presents with  . Cough  . Fall  . Head Laceration    HPI Robert Gross is a 84 y.o. male.  The history is provided by the patient. No language interpreter was used.  Cough  Pertinent negatives include no headaches.  Fall  Pertinent negatives include no headaches.  Head Laceration  Pertinent negatives include no headaches.     84 year old male with history of hypertension, diabetes, recurrent dizzy spell presenting to the ED for evaluation of a recent fall.  Patient states approximately 3 hours ago he was walking down his carport, stumbled against a uneven concrete and fell forward striking the back of his head against the bed of a truck.  He was able to grab onto the truck to prevent falling directly to the ground and was able to get up.  He noticed quite a bit of blood coming from his scalp which concerns him.  He denies any vision changes, nausea, neck pain, focal numbness or weakness.  Denies any precipitating symptoms prior to the fall.  He is up-to-date with tetanus.  He is not on any blood thinner medication.  He did complaining of a recurrent cough for the past 3 weeks but denies any pleuritic chest pain or shortness of breath.  Past Medical History:  Diagnosis Date  . Arthritis   . BPH with urinary obstruction   . Dizzy spells    residual from concussion 09-05-2017 intermittantly when turns head but stated as of 09-26-2017 no issues for past 2-3 days  . Essential hypertension   . Foley catheter in place   . GERD (gastroesophageal reflux disease)   . History of concussion    09-05-2017  w/ loc --- per pt residual intermittant dizziness when he turns his head either way  . History of prostatitis   . History of sepsis    09-12-2017  urosepsis  . History of urinary retention   . Type 2 diabetes  mellitus ([place])   . Urinary retention   . Wears partial dentures    upper    Patient Active Problem List   Diagnosis Date Noted  . Urinary retention due to benign prostatic hyperplasia 09/28/2017  . Acute urinary retention 09/12/2017  . Sepsis ([place]) 09/12/2017  . Abdominal pain 09/12/2017  . Essential hypertension   . Diabetes mellitus without complication ([place])   . GERD (gastroesophageal reflux disease)     Past Surgical History:  Procedure Laterality Date  . CATARACT EXTRACTION W/ INTRAOCULAR LENS  IMPLANT, BILATERAL  03/2017  . EXICISION EPIDERMAL CYST LEFT THUMB  09-12-2001   dr sypher  [facility]  . INGUINAL HERNIA REPAIR Right 11-07-2007   dr Dalbert [facility]  . KNEE ARTHROSCOPY Right 1990s  . TRANSURETHRAL RESECTION OF PROSTATE  07-26-2000  dr Amalia Hailey [facility]  . TRANSURETHRAL RESECTION OF PROSTATE N/A 09/28/2017   Procedure: BIPOLAR TRANSURETHRAL RESECTION OF THE PROSTATE (TURP);  Surgeon: Lucas Mallow, MD;  Location: [facility];  Service: Urology;  Laterality: N/A;        Home Medications    Prior to Admission medications   Medication Sig Start Date End Date Taking? Authorizing Provider  atorvastatin (LIPITOR) 10 MG tablet Take 5 mg every morning by mouth.     [provider]  CALCIUM-MAGNESIUM-ZINC PO Take 1 tablet by mouth every  morning.    [provider]  cholecalciferol (VITAMIN D) 1000 units tablet Take 1,000 Units by mouth daily.    [provider]  hydrochlorothiazide (HYDRODIURIL) 25 MG tablet Take 25 mg every morning by mouth.     [provider]  HYDROcodone-acetaminophen (NORCO/VICODIN) 5-325 MG tablet Take 1 tablet every 4 (four) hours as needed by mouth for moderate pain. 09/28/17 09/28/18  [name] [name], MD  lisinopril (PRINIVIL,ZESTRIL) 40 MG tablet Take 40 mg every morning by mouth.     [provider]  metFORMIN (GLUCOPHAGE) 500 MG tablet Take 2 (two) times daily with a meal by mouth. 1000 mg in the  morning and 500 mg in the evening     [provider]  ondansetron (ZOFRAN-ODT) 4 MG disintegrating tablet Take 1 tablet (4 mg total) by mouth every 8 (eight) hours as needed for nausea or vomiting. 09/06/17   Davonna Belling, MD  pantoprazole (PROTONIX) 20 MG tablet Take 20 mg 2 (two) times daily by mouth.     [provider]  Probiotic Product (ALIGN) 4 MG CAPS Take 1 capsule daily by mouth.    [provider]    Family History Family History  Problem Relation Age of Onset  . Pancreatic cancer Mother   . Melanoma Sister   . Lung cancer Brother     Social History Social History   Tobacco Use  . Smoking status: Former Smoker    Years: 1.00    Types: Cigarettes    Last attempt to quit: 09/27/1967    Years since quitting: 50.8  . Smokeless tobacco: Never Used  Substance Use Topics  . Alcohol use: Yes    Comment: seldom  . Drug use: No     Allergies   Patient has no known allergies.   Review of Systems Review of Systems  Respiratory: Positive for cough.   Skin: Positive for wound.  Neurological: Negative for headaches.  All other systems reviewed and are negative.    Physical Exam Updated Vital Signs BP (!) 142/75 (BP Location: Left Arm)   Pulse 86   Temp 97.6 F (36.4 C) (Oral)   Resp 18   Ht 5\' 8"  (1.727 m)   Wt 61.2 kg   SpO2 99%   BMI 20.53 kg/m   Physical Exam  Constitutional: He is oriented to person, place, and time. He appears well-developed and well-nourished. No distress.  HENT:  Head: Normocephalic.  There is a hematoma approximately 3 cm in diameter noted to left parietal lobe with a 2 cm laceration not actively bleeding.  It is mildly tender without crepitus.  Eyes: Conjunctivae are normal.  Neck: Neck supple.  Neurological: He is alert and oriented to person, place, and time. He has normal strength. No cranial nerve deficit or sensory deficit. GCS eye subscore is 4. GCS verbal subscore is 5. GCS motor subscore is  6.  Skin: No rash noted.  Psychiatric: He has a normal mood and affect.  Nursing note and vitals reviewed.    ED Treatments / Results  Labs (all labs ordered are listed, but only abnormal results are displayed) Labs Reviewed  URINALYSIS, ROUTINE W REFLEX MICROSCOPIC - Abnormal; Notable for the following components:      Result Value   Glucose, UA 50 (*)    Hgb urine dipstick SMALL (*)    Leukocytes, UA LARGE (*)    WBC, UA >50 (*)    Bacteria, UA RARE (*)    All other  components within normal limits  URINE CULTURE    EKG None  ED ECG REPORT   Date: 07/29/2018  Rate: 83  Rhythm: normal sinus rhythm  QRS Axis: left  Intervals: normal  ST/T Wave abnormalities: nonspecific ST changes  Conduction Disutrbances:none  Narrative Interpretation:   Old EKG Reviewed: unchanged  I have personally reviewed the EKG tracing and agree with the computerized printout as noted.   Radiology Dg Chest 2 View  Result Date: 07/29/2018 CLINICAL DATA:  Nonproductive cough for 3 weeks. EXAM: CHEST - 2 VIEW COMPARISON:  11/03/2007 FINDINGS: The heart size and mediastinal contours are within normal limits. Aortic atherosclerosis. Both lungs are clear. The visualized skeletal structures are unremarkable. IMPRESSION: No active cardiopulmonary disease. Electronically Signed   By: Earle Gell M.D.   On: 07/29/2018 15:05   Ct Head Wo Contrast  Result Date: 07/29/2018 CLINICAL DATA:  Head trauma, minor, GCS greater than 13, low clinical risk, initial exam. Fell today. Hit head on concrete step. EXAM: CT HEAD WITHOUT CONTRAST CT CERVICAL SPINE WITHOUT CONTRAST TECHNIQUE: Multidetector CT imaging of the head and cervical spine was performed following the standard protocol without intravenous contrast. Multiplanar CT image reconstructions of the cervical spine were also generated. COMPARISON:  CT of the neck 11/17/2017.  MRI brain 09/06/2017 FINDINGS: CT HEAD FINDINGS Brain: Mild atrophy and white matter  disease is again noted. No acute infarct, hemorrhage, or mass lesion is present. Suction the ventricles are proportionate to the degree of atrophy. No significant extra-axial fluid collection is present. Brainstem and cerebellum are normal. Vascular: Atherosclerotic calcifications are present at the cavernous internal carotid arteries bilaterally. There is no hyperdense vessel. Skull: Calvarium is intact. No focal lytic or blastic lesions are present. Left parietal scalp laceration and hematoma is present without underlying fracture. Sinuses/Orbits: The paranasal sinuses are clear. There is fluid in the mastoid air cells bilaterally. No obstructing nasopharyngeal lesion is present. Other: Superficial right parotid lesion measuring 13 mm is again seen. CT CERVICAL SPINE FINDINGS Alignment: AP alignment is anatomic. There is some straightening of the normal cervical lordosis. Skull base and vertebrae: The craniocervical junction is normal. Vertebral body heights are normal. No acute fractures are present. Soft tissues and spinal canal: The soft tissues of the neck demonstrate atherosclerotic changes at the carotid bifurcations bilaterally. Thyroid is somewhat heterogeneous without a dominant lesion. No focal mucosal or submucosal lesions are present. Cords are midline and symmetric. Disc levels: Uncovertebral spurring contributes to bilateral foraminal stenosis C3-4, C4-5, C5-6, and the left at C6-7. Upper chest: The lung apices are clear without focal nodule, mass, or airspace disease. The thoracic inlet is within normal limits. IMPRESSION: 1. Left parietal scalp hematoma and laceration without underlying fracture. 2. No acute intracranial abnormality. 3. Stable atrophy and white matter disease. 4. 13 mm right parotid lesion with layering calcium is again noted. This is likely a benign lesion. 5. Multilevel degenerative changes in the cervical spine without acute fracture. Electronically Signed   By: San Morelle M.D.   On: 07/29/2018 15:23   Ct Cervical Spine Wo Contrast  Result Date: 07/29/2018 CLINICAL DATA:  Head trauma, minor, GCS greater than 13, low clinical risk, initial exam. Fell today. Hit head on concrete step. EXAM: CT HEAD WITHOUT CONTRAST CT CERVICAL SPINE WITHOUT CONTRAST TECHNIQUE: Multidetector CT imaging of the head and cervical spine was performed following the standard protocol without intravenous contrast. Multiplanar CT image reconstructions of the cervical spine were also generated. COMPARISON:  CT of the  neck 11/17/2017.  MRI brain 09/06/2017 FINDINGS: CT HEAD FINDINGS Brain: Mild atrophy and white matter disease is again noted. No acute infarct, hemorrhage, or mass lesion is present. Suction the ventricles are proportionate to the degree of atrophy. No significant extra-axial fluid collection is present. Brainstem and cerebellum are normal. Vascular: Atherosclerotic calcifications are present at the cavernous internal carotid arteries bilaterally. There is no hyperdense vessel. Skull: Calvarium is intact. No focal lytic or blastic lesions are present. Left parietal scalp laceration and hematoma is present without underlying fracture. Sinuses/Orbits: The paranasal sinuses are clear. There is fluid in the mastoid air cells bilaterally. No obstructing nasopharyngeal lesion is present. Other: Superficial right parotid lesion measuring 13 mm is again seen. CT CERVICAL SPINE FINDINGS Alignment: AP alignment is anatomic. There is some straightening of the normal cervical lordosis. Skull base and vertebrae: The craniocervical junction is normal. Vertebral body heights are normal. No acute fractures are present. Soft tissues and spinal canal: The soft tissues of the neck demonstrate atherosclerotic changes at the carotid bifurcations bilaterally. Thyroid is somewhat heterogeneous without a dominant lesion. No focal mucosal or submucosal lesions are present. Cords are midline and symmetric. Disc  levels: Uncovertebral spurring contributes to bilateral foraminal stenosis C3-4, C4-5, C5-6, and the left at C6-7. Upper chest: The lung apices are clear without focal nodule, mass, or airspace disease. The thoracic inlet is within normal limits. IMPRESSION: 1. Left parietal scalp hematoma and laceration without underlying fracture. 2. No acute intracranial abnormality. 3. Stable atrophy and white matter disease. 4. 13 mm right parotid lesion with layering calcium is again noted. This is likely a benign lesion. 5. Multilevel degenerative changes in the cervical spine without acute fracture. Electronically Signed   By: San Morelle M.D.   On: 07/29/2018 15:23    Procedures .Marland KitchenLaceration Repair Date/Time: 07/29/2018 4:09 PM Performed by: Domenic Moras, PA-C Authorized by: Domenic Moras, PA-C   Consent:    Consent obtained:  Verbal   Consent given by:  Patient   Risks discussed:  Infection, need for additional repair, pain, poor cosmetic result and poor wound healing   Alternatives discussed:  No treatment and delayed treatment Universal protocol:    Procedure explained and questions answered to patient or proxy's satisfaction: yes     Relevant documents present and verified: yes     Test results available and properly labeled: yes     Imaging studies available: yes     Required blood products, implants, devices, and special equipment available: yes     Site/side marked: yes     Immediately prior to procedure, a time out was called: yes     Patient identity confirmed:  Verbally with patient Anesthesia (see MAR for exact dosages):    Anesthesia method:  Local infiltration and topical application   Topical anesthetic:  LET Laceration details:    Location:  Scalp   Scalp location:  L parietal   Length (cm):  2   Depth (mm):  3 Repair type:    Repair type:  Simple Pre-procedure details:    Preparation:  Patient was prepped and draped in usual sterile fashion and imaging obtained to  evaluate for foreign bodies Exploration:    Hemostasis achieved with:  LET   Wound exploration: wound explored through full range of motion and entire depth of wound probed and visualized     Wound extent: no underlying fracture noted     Contaminated: no   Treatment:    Area cleansed with:  Saline  Amount of cleaning:  Standard   Irrigation solution:  Sterile saline   Irrigation volume:  500   Irrigation method:  Pressure wash   Visualized foreign bodies/material removed: no   Skin repair:    Repair method:  Staples   Number of staples:  3 Approximation:    Approximation:  Close Post-procedure details:    Dressing:  Non-adherent dressing   Patient tolerance of procedure:  Tolerated well, no immediate complications   (including critical care time)  Medications Ordered in ED Medications  lidocaine-EPINEPHrine-tetracaine (LET) solution (3 mLs Topical Given 07/29/18 1515)     Initial Impression / Assessment and Plan / ED Course  I have reviewed the triage vital signs and the nursing notes.  Pertinent labs & imaging results that were available during my care of the patient were reviewed by me and considered in my medical decision making (see chart for details).     BP 120/65   Pulse 86   Temp 97.6 F (36.4 C) (Oral)   Resp (!) 23   Ht 5\' 8"  (1.727 m)   Wt 61.2 kg   SpO2 97%   BMI 20.53 kg/m    Final Clinical Impressions(s) / ED Diagnoses   Final diagnoses:  Laceration of scalp, initial encounter  Acute lower UTI    ED Discharge Orders         Ordered    acetaminophen (TYLENOL) 500 MG tablet  Every 6 hours PRN     07/29/18 1634    cephALEXin (KEFLEX) 500 MG capsule  3 times daily     07/29/18 1639         3:05 PM Patient had a mechanical fall and injured his left parietal scalp.  He had a laceration which will benefit from laceration repair.  Will perform screening exam.  He also endorsed recurrent cough, chest x-ray ordered.  4:10 PM CT of head  demonstrates L parietal scalp hematoma and laceration without underlying fracture.  CXR unremarkable.  Scalp lac repaired by me.  Dressing placed.  Pt otherwise stable for discharge.   UA concerning for UTI.  Will treat with Keflex.  Urine culture sent.  Pt encourage to f/u with PCP.    Domenic Moras, PA-C 07/29/18 1644    Maudie Flakes, MD 07/29/18 [phone]

## 2018-07-31 LAB — URINE CULTURE

## 2019-03-26 IMAGING — CT CT ABD-PELV W/ CM
2 of 5 series · 16 of 46 positions shown, 18 images · IV contrast (ISOVUE)
Comparison: 10/05/2004

CLINICAL DATA: Abdominal pain, left upper quadrant with recent fall

EXAM:
CT ABDOMEN AND PELVIS WITH CONTRAST
TECHNIQUE: Multidetector CT imaging of the abdomen and pelvis was performed
using the standard protocol following bolus administration of
intravenous contrast.
CONTRAST:  100 cc Wsovue-H11 intravenous

[Series 2: abd/pel with · axial · 0.76mm/px · z∈[-291,+119]mm · 13 of 96 slices shown, 15 images]
[im 7/96  soft-tissue]
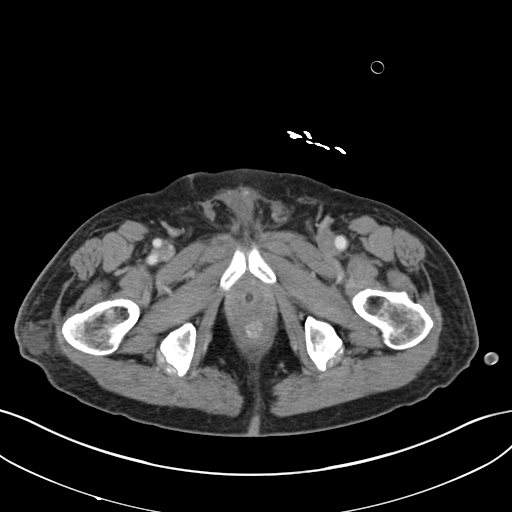
[im 7/96  bone]
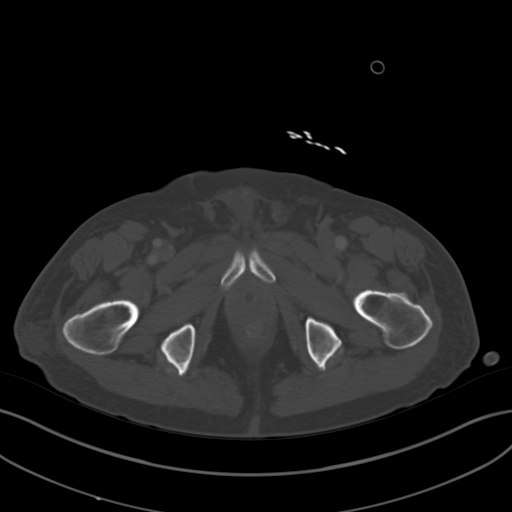
[im 13/96  soft-tissue]
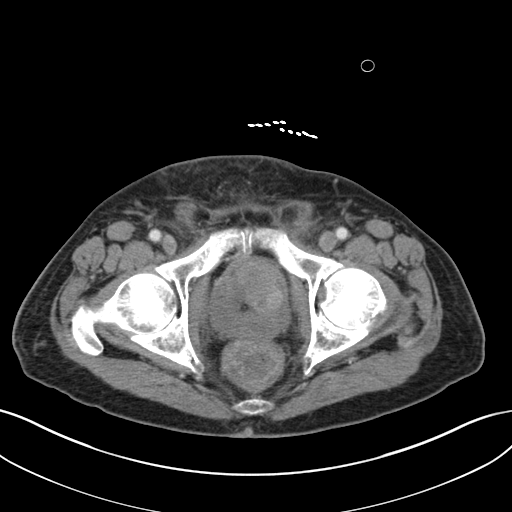
[im 20/96  soft-tissue]
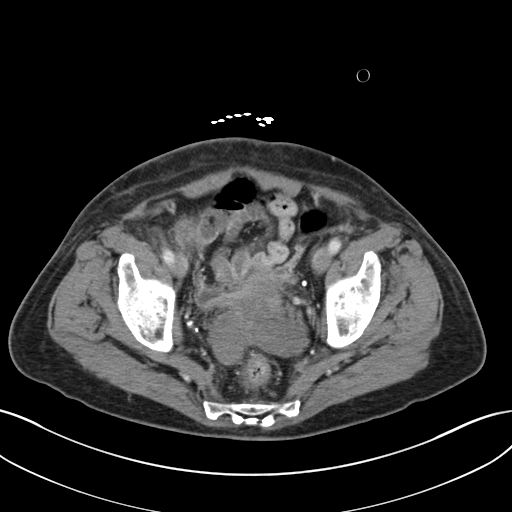
[im 26/96  soft-tissue]
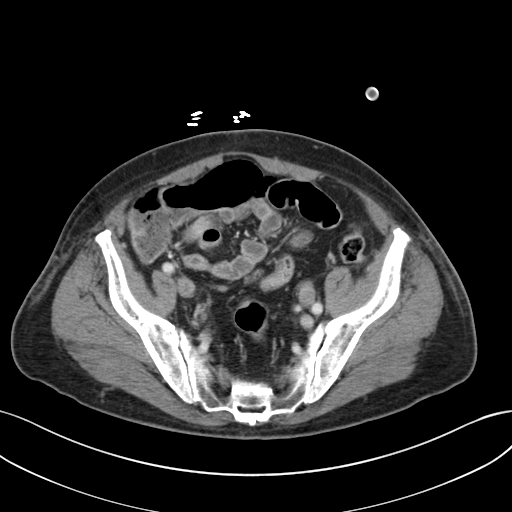
[im 32/96  soft-tissue]
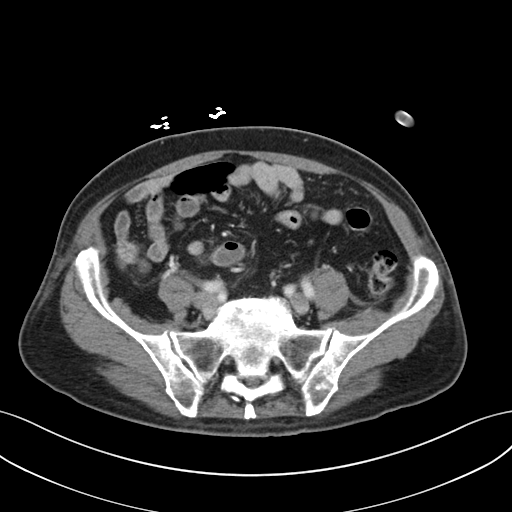
[im 39/96  soft-tissue]
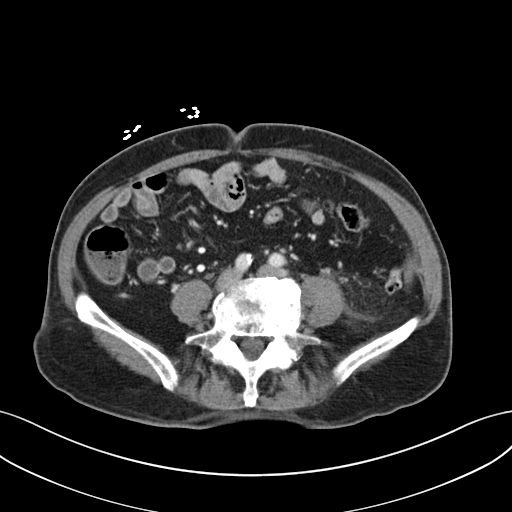
[im 51/96  soft-tissue]
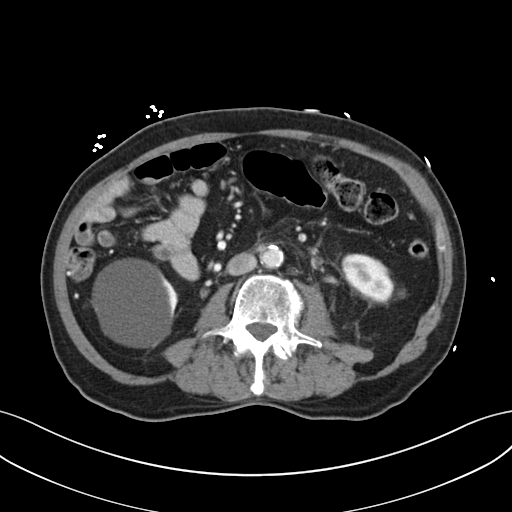
[im 58/96  soft-tissue]
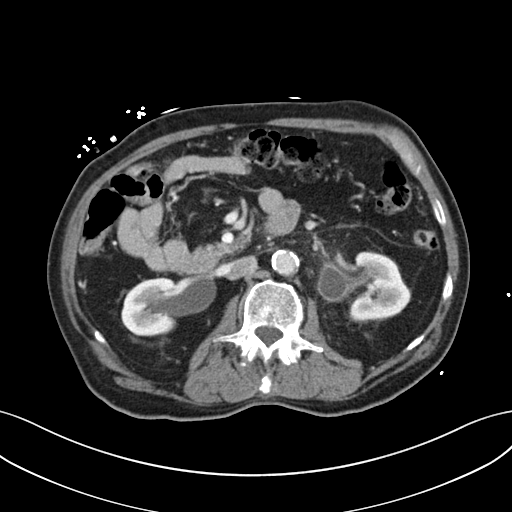
[im 64/96  soft-tissue]
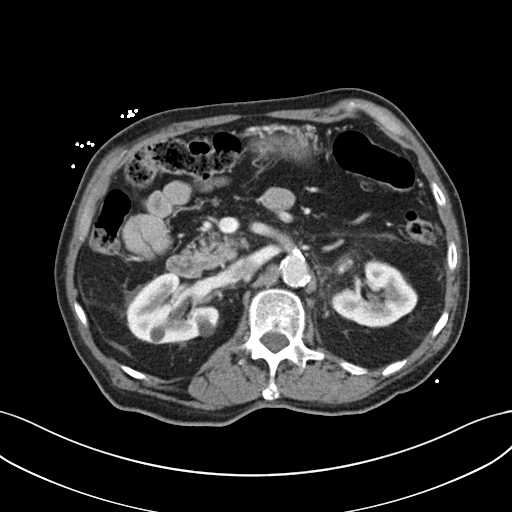
[im 64/96  bone]
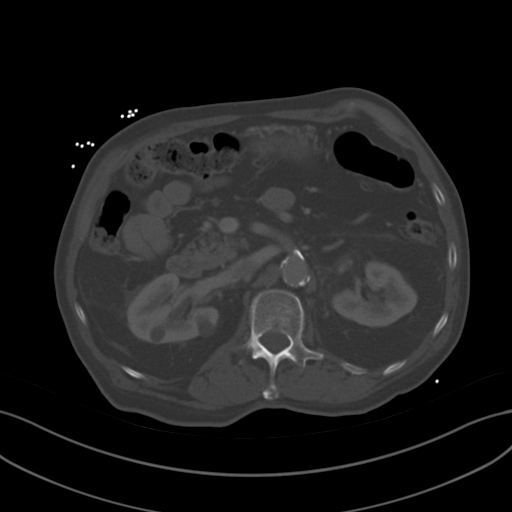
[im 70/96  soft-tissue]
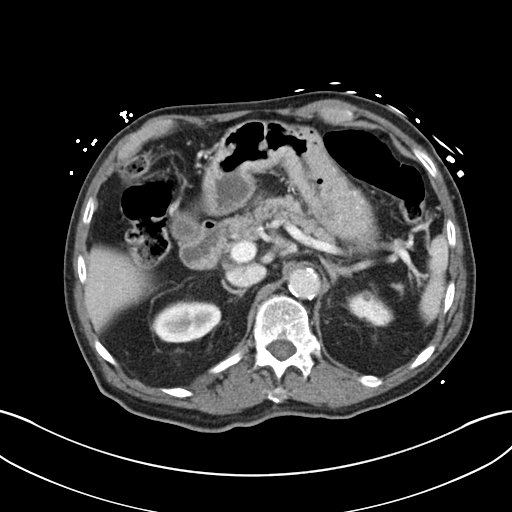
[im 77/96  soft-tissue]
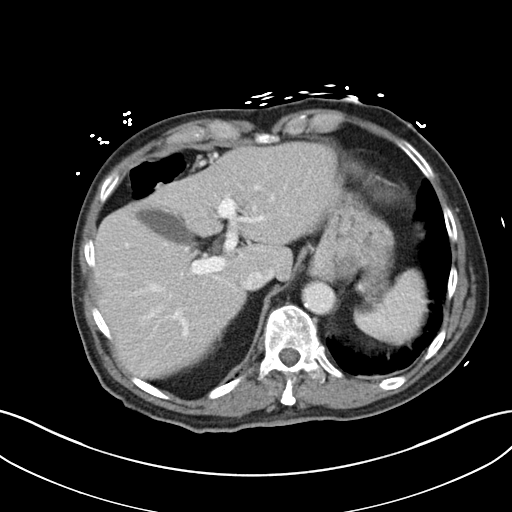
[im 83/96  soft-tissue]
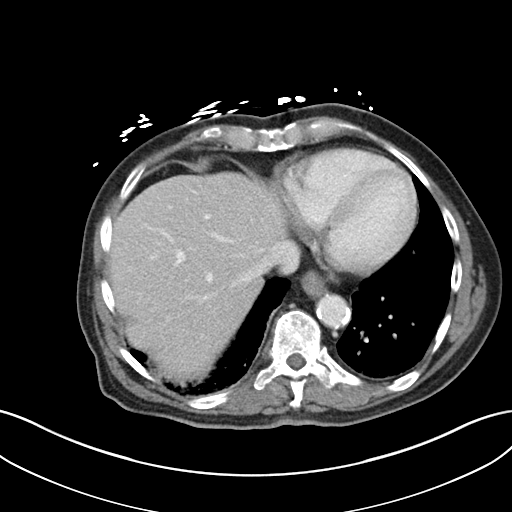
[im 89/96  soft-tissue]
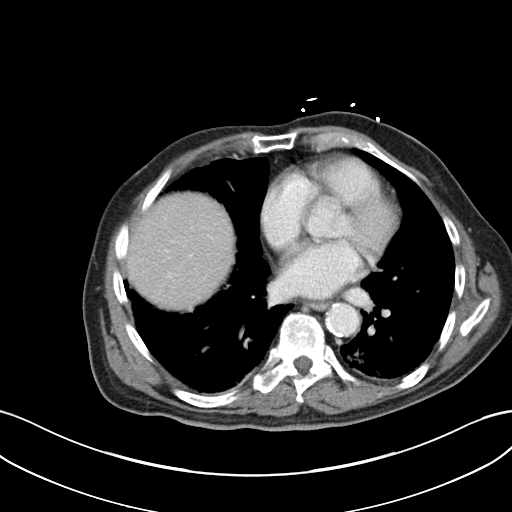

[Series 5: coronal a/|p · coronal · 0.80mm/px · 3 of 128 slices shown]
[im 43/128  soft-tissue]
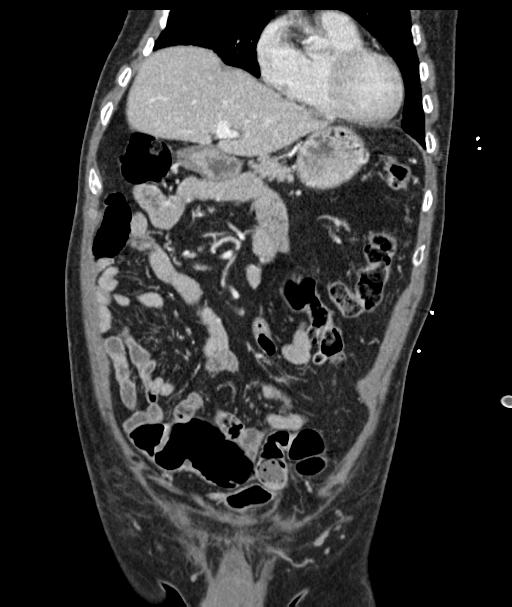
[im 57/128  soft-tissue]
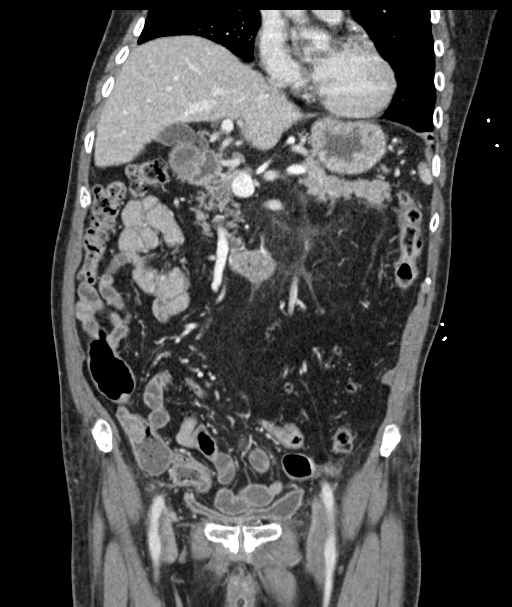
[im 71/128  soft-tissue]
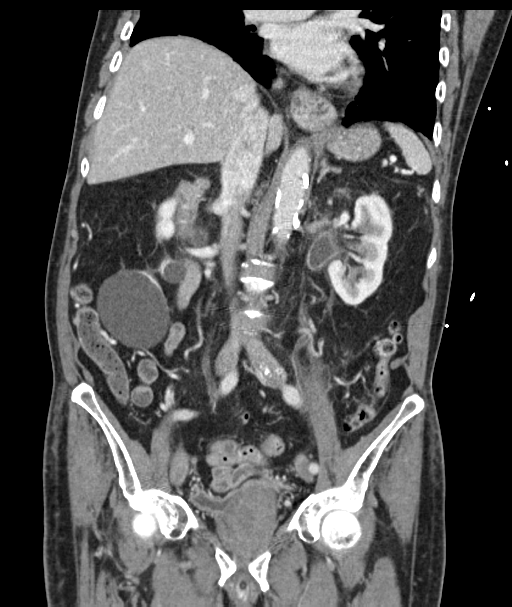

[16 of 46 positions shown; findings below may reference images not displayed]

FINDINGS: Lower chest: Lung bases demonstrate patchy dependent atelectasis. No
acute consolidation or pleural effusion. Mild coronary
calcification. Normal heart size.

Hepatobiliary: Multiple calcified stones in the gallbladder. No
focal hepatic abnormality. Granuloma. No biliary dilatation

Pancreas: Unremarkable. No pancreatic ductal dilatation or
surrounding inflammatory changes.

Spleen: Normal in size without focal abnormality.

Adrenals/Urinary Tract: Adrenal glands are within normal limits.
Multiple cysts within the bilateral kidneys. Multiple subcentimeter
hypodensities too small to further characterize. Mild urothelial
enhancement of left renal pelvis and ureter with mild soft tissue
stranding at the left renal pelvis, asymmetric compared to
contralateral right kidney. Dilated extrarenal pelvis bilaterally.
No ureteral stone.

The bladder is decompressed. A Foley catheter is present but the
balloon appears positions within the markedly enlarged prostate
gland.

Stomach/Bowel: Stomach is nonenlarged. No dilated small bowel. Small
hiatal hernia. Mild wall thickening of the rectum is suspected.
Sigmoid colon diverticular disease without acute inflammation

Vascular/Lymphatic: Aortic atherosclerosis. No significantly
enlarged lymph nodes.

Reproductive: Enlarged heterogeneous prostate

Other: Negative for free air or significant free fluid.

Musculoskeletal: No acute or suspicious bone lesion.
IMPRESSION: 1. No CT evidence for acute solid organ injury.
2. Mild to moderate left perinephric fat stranding and edema. There
is asymmetric urothelial enhancement of left renal pelvis and ureter
with soft tissue stranding at the left renal pelvis suggesting
urinary tract infection or inflammation. No definitive stones along
the course of the ureters. Multiple renal cysts.
3. Foley catheter balloon appears position within the very enlarged
and heterogeneous prostate gland
4. Mild wall thickening of the rectum, query proctitis.
5. Gallstones
6. Sigmoid colon diverticular disease without acute inflammation

## 2019-03-26 IMAGING — MR MR LUMBAR SPINE WO/W CM
4 of 7 series · 17 of 48 positions shown · IV contrast (yes)
Comparison: Prior CT from earlier same day.

CLINICAL DATA: Initial evaluation for back pain, frequent falls.
Evaluate for cauda equina.

EXAM:
MRI LUMBAR SPINE WITHOUT AND WITH CONTRAST
TECHNIQUE: Multiplanar and multiecho pulse sequences of the lumbar spine were
obtained without and with intravenous contrast.
CONTRAST:  13mL MULTIHANCE GADOBENATE DIMEGLUMINE 529 MG/ML IV SOLN

[Series 3: T1 · sagittal · 4.0mm · 0.51mm/px · 3 of 15 slices shown (1 of 2)]
[im 1/15]
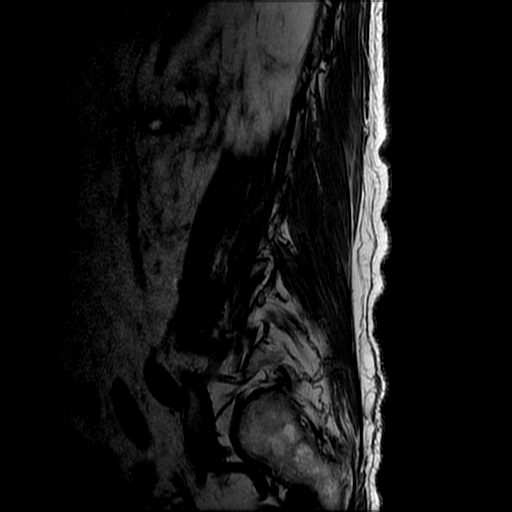
[im 8/15]
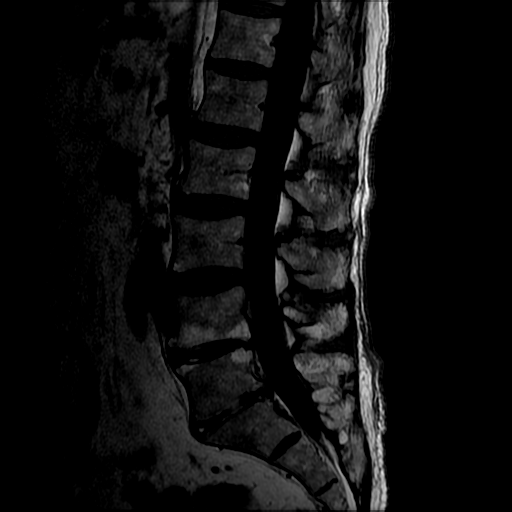
[im 15/15]
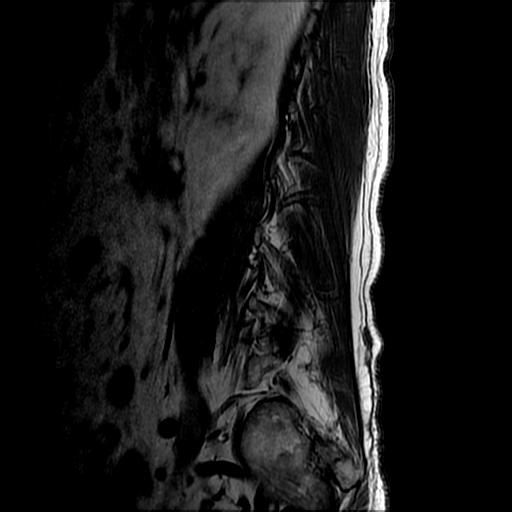

[Series 4: T2 post-contrast · sagittal · 4.0mm · 0.51mm/px · 4 of 15 slices shown]
[im 1/15]
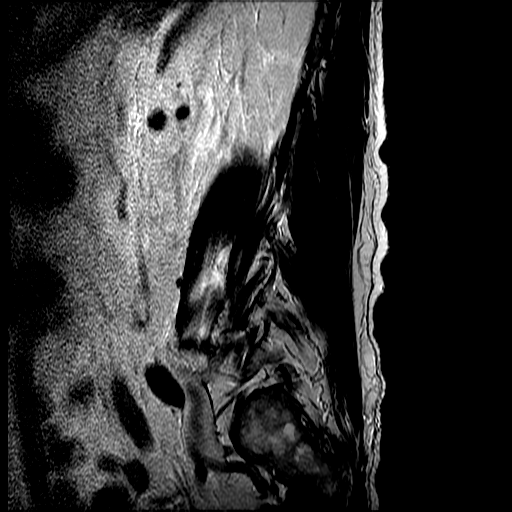
[im 5/15]
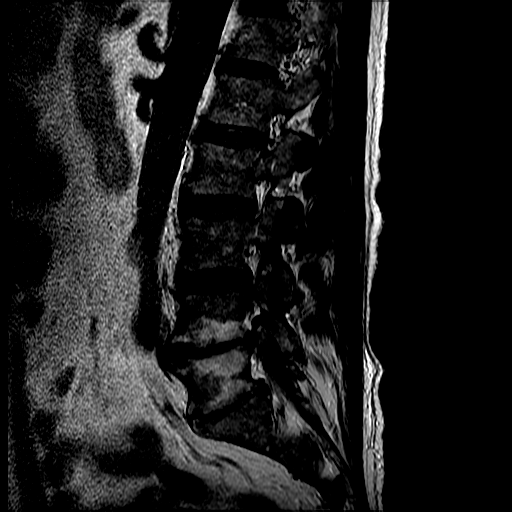
[im 10/15]
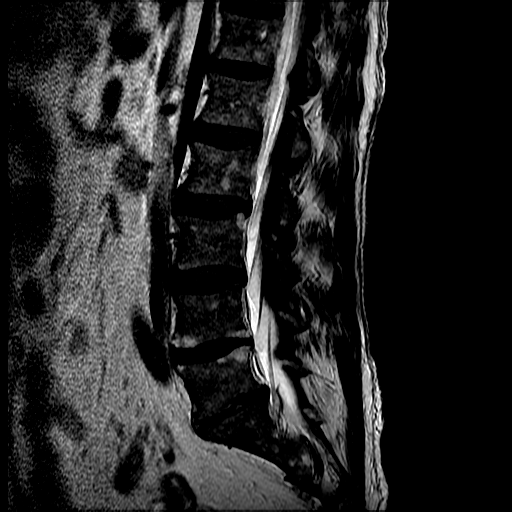
[im 15/15]
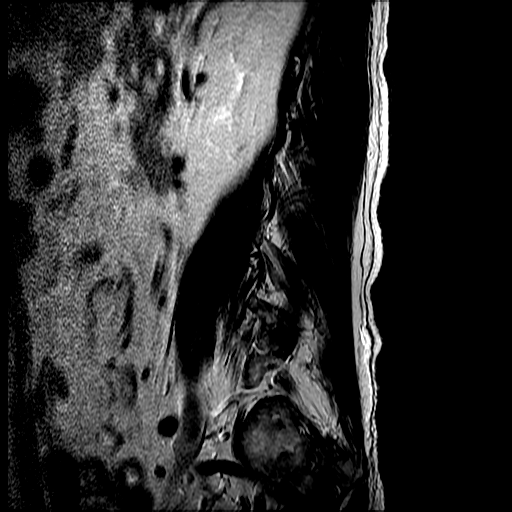

[Series 6: T2 · axial · 4.0mm · 0.39mm/px · z∈[-94,+106]mm · 7 of 40 slices shown]
[im 1/40]
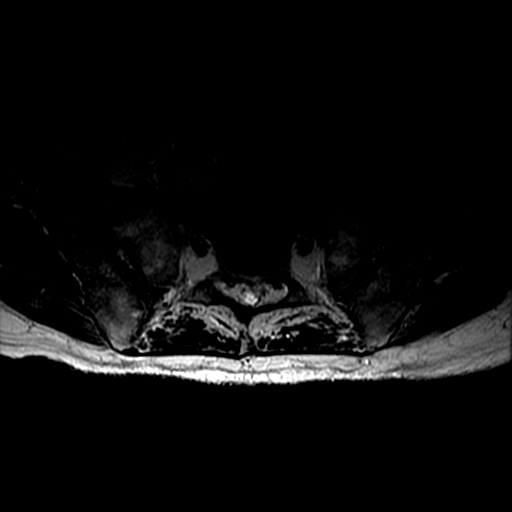
[im 4/40]
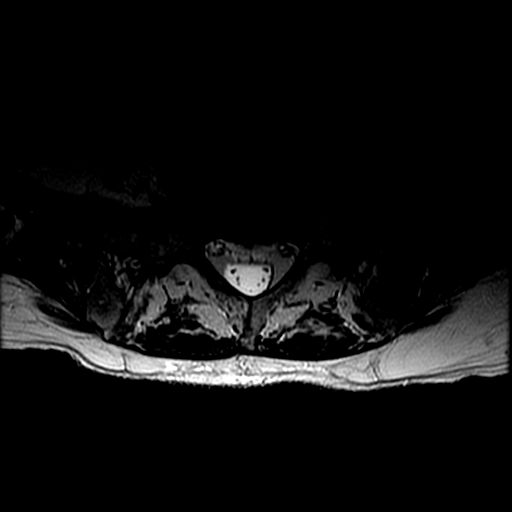
[im 8/40]
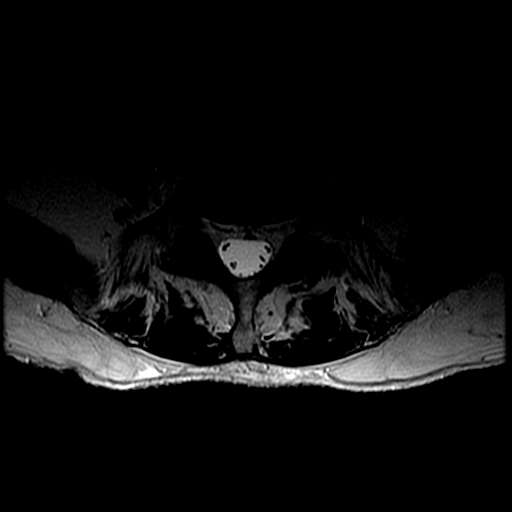
[im 12/40]
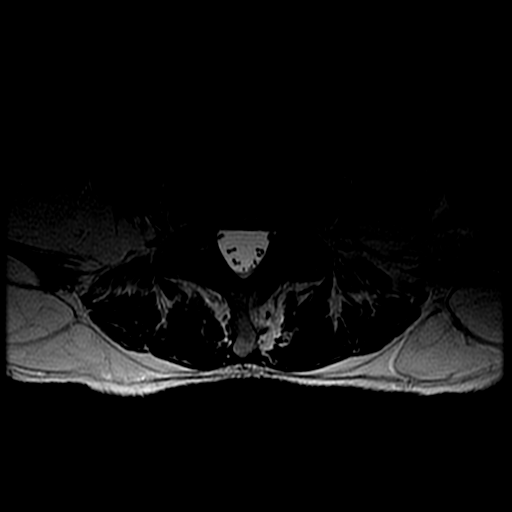
[im 16/40]
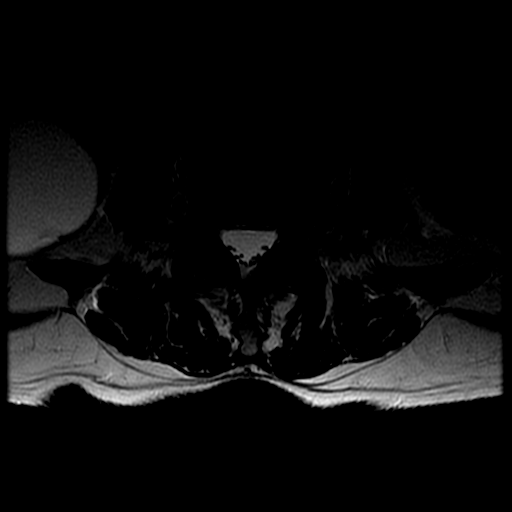
[im 20/40]
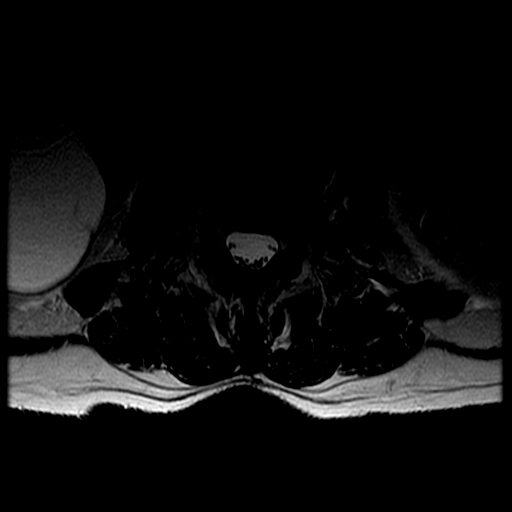
[im 36/40]
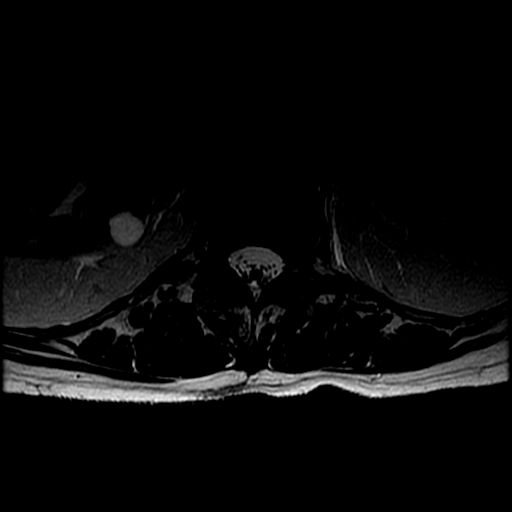

[Series 7: T1 · axial · 4.0mm · 0.39mm/px · z∈[-79,+106]mm · 3 of 40 slices shown (2 of 2)]
[im 4/40]
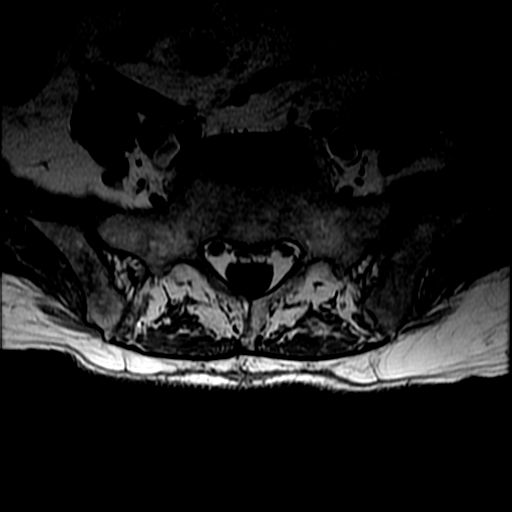
[im 20/40]
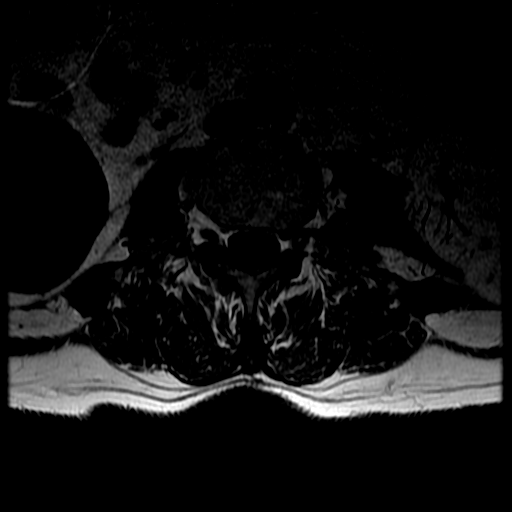
[im 36/40]
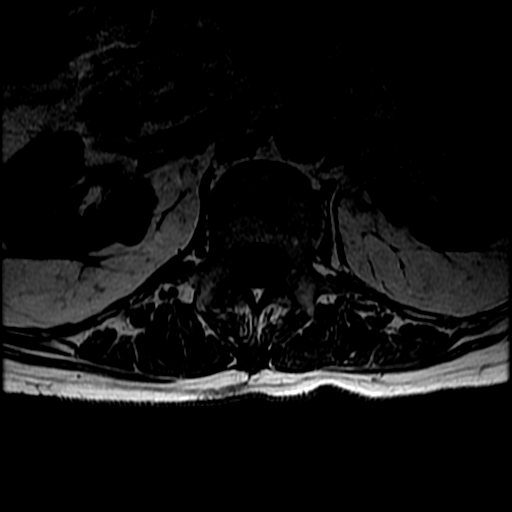

[17 of 48 positions shown; findings below may reference images not displayed]

FINDINGS: Segmentation: Normal segmentation. Lowest well-formed disc labeled
the L5-S1 level.

Alignment: Vertebral bodies normally aligned with preservation of
the normal lumbar lordosis. No listhesis is subluxation.

Vertebrae: Vertebral body heights well maintained. No evidence for
acute or chronic fracture. Bone marrow signal intensity somewhat
heterogeneous but within normal limits. No worrisome osseous
lesions. No abnormal enhancement or findings to suggest leak
discitis or osteomyelitis. Prominent reactive endplate changes noted
about the L4-5 interspace, most prevalent on the left.

Conus medullaris: Extends to the L1 level and appears normal.

Paraspinal and other soft tissues: Edema and fluid partially
visualized about the left kidney extending inferiorly within the
left retroperitoneal space. Mild edema within the left psoas muscle.
Mild associated enhancement. Findings appear to be largely
associated with the left kidney, and are better evaluated on prior
CT. Apparent increased signal intensity within the lower paraspinous
musculature on sagittal STIR sequence felt to be related to
incomplete fat saturation. No other acute paraspinous soft tissue
abnormality. Bilateral renal cysts again noted.

Disc levels:

L1-2: Disc desiccation without significant disc bulge. Mild
bilateral facet and ligament flavum hypertrophy. No canal or
foraminal stenosis.

L2-3: Disc desiccation with mild disc bulge. Mild to moderate facet
and ligamentum flavum hypertrophy. No significant canal or foraminal
stenosis.

L3-4: Disc desiccation with minimal disc bulge. Mild to moderate
facet and ligamentum flavum hypertrophy. No significant canal or
neural foraminal stenosis.

L4-5: Chronic intervertebral disc space narrowing with diffuse disc
bulge and disc desiccation. Left far lateral reactive endplate
changes with marginal endplate osteophytic spurring. Moderate facet
and ligament flavum hypertrophy. No significant canal stenosis. Mild
to moderate left L4 foraminal narrowing due to disc osteophyte and
facet disease. No frank neural impingement.

L5-S1: Chronic intervertebral disc space narrowing with diffuse disc
bulge with disc desiccation. Reactive endplate changes with marginal
endplate osteophytic spurring. No significant spinal stenosis.
Foramina remain patent.
IMPRESSION: 1. No acute abnormality within the lumbar spine. No significant
stenosis or evidence for cord compression.
2. Abnormal edema within the lateral left psoas muscle and left
retroperitoneal space, likely related to the inflamed left renal
collecting system as seen on prior CT. No evidence for osteomyelitis
discitis or other acute infection within the lumbar spine.
3. Left eccentric degenerative disc osteophyte and facet disease at
L4-5 with resultant mild to moderate left L4 foraminal narrowing.
4. Additional fairly mild for age degenerative disc bulging and
facet disease as above. No other significant stenosis or evidence
for neural impingement.

## 2020-01-11 ENCOUNTER — Observation Stay (HOSPITAL_COMMUNITY)
Admission: EM | Admit: 2020-01-11 | Discharge: 2020-01-13 | Disposition: A | Discharge status: Home / Self Care | Payer: Medicare PPO | Attending: Internal Medicine | Admitting: Internal Medicine

## 2020-01-11 ENCOUNTER — Emergency Department (HOSPITAL_COMMUNITY): Payer: Medicare PPO

## 2020-01-11 ENCOUNTER — Other Ambulatory Visit: Payer: Self-pay

## 2020-01-11 ENCOUNTER — Encounter (HOSPITAL_COMMUNITY): Payer: Self-pay

## 2020-01-11 DIAGNOSIS — Z7901 Long term (current) use of anticoagulants: Secondary | ICD-10-CM | POA: Insufficient documentation

## 2020-01-11 DIAGNOSIS — G9340 Encephalopathy, unspecified: Secondary | ICD-10-CM | POA: Diagnosis present

## 2020-01-11 DIAGNOSIS — Z794 Long term (current) use of insulin: Secondary | ICD-10-CM | POA: Diagnosis not present

## 2020-01-11 DIAGNOSIS — Z20822 Contact with and (suspected) exposure to covid-19: Secondary | ICD-10-CM | POA: Diagnosis not present

## 2020-01-11 DIAGNOSIS — N3941 Urge incontinence: Secondary | ICD-10-CM

## 2020-01-11 DIAGNOSIS — M1990 Unspecified osteoarthritis, unspecified site: Secondary | ICD-10-CM | POA: Diagnosis not present

## 2020-01-11 DIAGNOSIS — R413 Other amnesia: Secondary | ICD-10-CM | POA: Diagnosis present

## 2020-01-11 DIAGNOSIS — F0390 Unspecified dementia without behavioral disturbance: Secondary | ICD-10-CM | POA: Insufficient documentation

## 2020-01-11 DIAGNOSIS — G9341 Metabolic encephalopathy: Principal | ICD-10-CM | POA: Diagnosis present

## 2020-01-11 DIAGNOSIS — N390 Urinary tract infection, site not specified: Secondary | ICD-10-CM | POA: Diagnosis present

## 2020-01-11 DIAGNOSIS — N401 Enlarged prostate with lower urinary tract symptoms: Secondary | ICD-10-CM | POA: Insufficient documentation

## 2020-01-11 DIAGNOSIS — Z801 Family history of malignant neoplasm of trachea, bronchus and lung: Secondary | ICD-10-CM | POA: Insufficient documentation

## 2020-01-11 DIAGNOSIS — K219 Gastro-esophageal reflux disease without esophagitis: Secondary | ICD-10-CM | POA: Insufficient documentation

## 2020-01-11 DIAGNOSIS — W19XXXA Unspecified fall, initial encounter: Secondary | ICD-10-CM | POA: Diagnosis present

## 2020-01-11 DIAGNOSIS — Z800 Family history of malignant neoplasm of digestive organs: Secondary | ICD-10-CM | POA: Insufficient documentation

## 2020-01-11 DIAGNOSIS — R531 Weakness: Secondary | ICD-10-CM

## 2020-01-11 DIAGNOSIS — N3000 Acute cystitis without hematuria: Secondary | ICD-10-CM | POA: Diagnosis not present

## 2020-01-11 DIAGNOSIS — I6782 Cerebral ischemia: Secondary | ICD-10-CM | POA: Insufficient documentation

## 2020-01-11 DIAGNOSIS — N312 Flaccid neuropathic bladder, not elsewhere classified: Secondary | ICD-10-CM | POA: Insufficient documentation

## 2020-01-11 DIAGNOSIS — Z808 Family history of malignant neoplasm of other organs or systems: Secondary | ICD-10-CM | POA: Diagnosis not present

## 2020-01-11 DIAGNOSIS — Z79899 Other long term (current) drug therapy: Secondary | ICD-10-CM | POA: Insufficient documentation

## 2020-01-11 DIAGNOSIS — R296 Repeated falls: Secondary | ICD-10-CM

## 2020-01-11 DIAGNOSIS — I700 Atherosclerosis of aorta: Secondary | ICD-10-CM | POA: Insufficient documentation

## 2020-01-11 DIAGNOSIS — Z87891 Personal history of nicotine dependence: Secondary | ICD-10-CM | POA: Insufficient documentation

## 2020-01-11 DIAGNOSIS — R338 Other retention of urine: Secondary | ICD-10-CM | POA: Diagnosis not present

## 2020-01-11 DIAGNOSIS — Z9181 History of falling: Secondary | ICD-10-CM | POA: Diagnosis not present

## 2020-01-11 DIAGNOSIS — I119 Hypertensive heart disease without heart failure: Secondary | ICD-10-CM | POA: Diagnosis not present

## 2020-01-11 DIAGNOSIS — E119 Type 2 diabetes mellitus without complications: Secondary | ICD-10-CM | POA: Diagnosis not present

## 2020-01-11 DIAGNOSIS — I10 Essential (primary) hypertension: Secondary | ICD-10-CM | POA: Diagnosis present

## 2020-01-11 DIAGNOSIS — Z87440 Personal history of urinary (tract) infections: Secondary | ICD-10-CM

## 2020-01-11 DIAGNOSIS — R05 Cough: Secondary | ICD-10-CM | POA: Insufficient documentation

## 2020-01-11 DIAGNOSIS — B3749 Other urogenital candidiasis: Secondary | ICD-10-CM | POA: Diagnosis not present

## 2020-01-11 LAB — HEMOGLOBIN A1C
Hgb A1c MFr Bld: 5.9 % — ABNORMAL HIGH (ref 4.8–5.6)
Mean Plasma Glucose: 122.63 mg/dL

## 2020-01-11 LAB — URINALYSIS, ROUTINE W REFLEX MICROSCOPIC
Bilirubin Urine: NEGATIVE
Glucose, UA: 50 mg/dL — AB
Hgb urine dipstick: NEGATIVE
Ketones, ur: NEGATIVE mg/dL
Nitrite: NEGATIVE
Protein, ur: 30 mg/dL — AB
Specific Gravity, Urine: 1.019 (ref 1.005–1.030)
WBC, UA: >50 WBC/hpf — ABNORMAL HIGH (ref 0–5)
pH: 6 (ref 5.0–8.0)

## 2020-01-11 LAB — CBC
HCT: 38.8 % — ABNORMAL LOW (ref 39.0–52.0)
Hemoglobin: 12.8 g/dL — ABNORMAL LOW (ref 13.0–17.0)
MCH: 32.9 pg (ref 26.0–34.0)
MCHC: 33 g/dL (ref 30.0–36.0)
MCV: 99.7 fL (ref 80.0–100.0)
Platelets: 180 10*3/uL (ref 150–400)
RBC: 3.89 MIL/uL — ABNORMAL LOW (ref 4.22–5.81)
RDW: 13.1 % (ref 11.5–15.5)
WBC: 10.1 10*3/uL (ref 4.0–10.5)
nRBC: 0 % (ref 0.0–0.2)

## 2020-01-11 LAB — COMPREHENSIVE METABOLIC PANEL
ALT: 14 U/L (ref 0–44)
AST: 16 U/L (ref 15–41)
Albumin: 2.8 g/dL — ABNORMAL LOW (ref 3.5–5.0)
Alkaline Phosphatase: 69 U/L (ref 38–126)
Anion gap: 8 (ref 5–15)
BUN: 21 mg/dL (ref 8–23)
CO2: 25 mmol/L (ref 22–32)
Calcium: 10 mg/dL (ref 8.9–10.3)
Chloride: 108 mmol/L (ref 98–111)
Creatinine, Ser: 1 mg/dL (ref 0.61–1.24)
GFR calc Af Amer: >60 mL/min (ref >60)
GFR calc non Af Amer: >60 mL/min (ref >60)
Glucose, Bld: 151 mg/dL — ABNORMAL HIGH (ref 70–99)
Potassium: 4.1 mmol/L (ref 3.5–5.1)
Sodium: 141 mmol/L (ref 135–145)
Total Bilirubin: 0.7 mg/dL (ref 0.3–1.2)
Total Protein: 5.4 g/dL — ABNORMAL LOW (ref 6.5–8.1)

## 2020-01-11 LAB — SARS CORONAVIRUS 2 (TAT 6-24 HRS): SARS Coronavirus 2: NEGATIVE

## 2020-01-11 LAB — GLUCOSE, CAPILLARY: Glucose-Capillary: 126 mg/dL — ABNORMAL HIGH (ref 70–99)

## 2020-01-11 MED ORDER — ATORVASTATIN CALCIUM 10 MG PO TABS
5.0000 mg | ORAL_TABLET | Freq: Every morning | ORAL | Status: DC
  Administered 2020-01-12 – 2020-01-13 (×2): 5 mg via ORAL
  Filled 2020-01-11 (×2): qty 1

## 2020-01-11 MED ORDER — PANTOPRAZOLE SODIUM 20 MG PO TBEC
20.0000 mg | DELAYED_RELEASE_TABLET | Freq: Two times a day (BID) | ORAL | Status: DC
  Administered 2020-01-11 – 2020-01-13 (×4): 20 mg via ORAL
  Filled 2020-01-11 (×4): qty 1

## 2020-01-11 MED ORDER — ACETAMINOPHEN 650 MG RE SUPP: 650.0000 mg | Freq: Four times a day (QID) | RECTAL | Status: DC | PRN

## 2020-01-11 MED ORDER — SODIUM CHLORIDE 0.9 % IV SOLN
1.0000 g | Freq: Once | INTRAVENOUS | Status: AC
  Administered 2020-01-11: 1 g via INTRAVENOUS
  Filled 2020-01-11: qty 10

## 2020-01-11 MED ORDER — INSULIN ASPART 100 UNIT/ML ~~LOC~~ SOLN
0.0000 [IU] | SUBCUTANEOUS | Status: DC
  Administered 2020-01-11: 20:00:00 1 [IU] via SUBCUTANEOUS
  Administered 2020-01-12: 20:00:00 2 [IU] via SUBCUTANEOUS
  Administered 2020-01-12: 10:00:00 3 [IU] via SUBCUTANEOUS
  Administered 2020-01-13: 08:00:00 2 [IU] via SUBCUTANEOUS

## 2020-01-11 MED ORDER — LISINOPRIL 40 MG PO TABS
40.0000 mg | ORAL_TABLET | Freq: Every morning | ORAL | Status: DC
  Administered 2020-01-12 – 2020-01-13 (×2): 40 mg via ORAL
  Filled 2020-01-11 (×2): qty 1

## 2020-01-11 MED ORDER — ENOXAPARIN SODIUM 40 MG/0.4ML ~~LOC~~ SOLN
40.0000 mg | SUBCUTANEOUS | Status: DC
  Administered 2020-01-11 – 2020-01-12 (×2): 40 mg via SUBCUTANEOUS
  Filled 2020-01-11 (×2): qty 0.4

## 2020-01-11 MED ORDER — SODIUM CHLORIDE 0.9 % IV BOLUS
500.0000 mL | Freq: Once | INTRAVENOUS | Status: AC
  Administered 2020-01-11: 500 mL via INTRAVENOUS

## 2020-01-11 MED ORDER — FLUCONAZOLE IN SODIUM CHLORIDE 200-0.9 MG/100ML-% IV SOLN
200.0000 mg | INTRAVENOUS | Status: DC
  Administered 2020-01-11: 21:00:00 200 mg via INTRAVENOUS
  Filled 2020-01-11 (×2): qty 100

## 2020-01-11 MED ORDER — ACETAMINOPHEN 325 MG PO TABS: 650.0000 mg | ORAL_TABLET | Freq: Four times a day (QID) | ORAL | Status: DC | PRN

## 2020-01-11 MED ORDER — SODIUM CHLORIDE 0.9 % IV SOLN
1.0000 g | INTRAVENOUS | Status: DC
  Administered 2020-01-11 – 2020-01-12 (×2): 1 g via INTRAVENOUS
  Filled 2020-01-11: qty 1
  Filled 2020-01-11: qty 10
  Filled 2020-01-11: qty 1

## 2020-01-11 NOTE — ED Triage Notes (Addendum)
Pt arrived via [facility] from home for AMS. Per EMS pt was neg for stroke. EMS stated pt used a device to get urine out, but it was a straight cath, but like a cath. EMS stated the urine looked "very yellow, like he has a urine infection." Pt is A&Ox2 to place and self. Pt is Pt is NSR w/PACs on monitor.

## 2020-01-11 NOTE — ED Provider Notes (Signed)
[facility] EMERGENCY DEPARTMENT Provider Note   CSN: [id number] Arrival date & time: 01/11/20  1357     History Chief Complaint  Patient presents with  . Altered Mental Status    Robert Gross is a 86 y.o. male.  Patient with history of diabetes, urinary retention, arthritis, urine infection presents after episode of confusion and dark urine.  Patient currently denies any symptoms.  Per reports significant other concern for confusion.  No stroke symptoms or signs for EMS on route.  No fever reported.  Patient's had cough the past few days.        Past Medical History:  Diagnosis Date  . Arthritis   . BPH with urinary obstruction   . Dizzy spells    residual from concussion 09-05-2017 intermittantly when turns head but stated as of 09-26-2017 no issues for past 2-3 days  . Essential hypertension   . Foley catheter in place   . GERD (gastroesophageal reflux disease)   . History of concussion    09-05-2017  w/ loc --- per pt residual intermittant dizziness when he turns his head either way  . History of prostatitis   . History of sepsis    09-12-2017  urosepsis  . History of urinary retention   . Type 2 diabetes mellitus ([place])   . Urinary retention   . Wears partial dentures    upper    Patient Active Problem List   Diagnosis Date Noted  . Urinary retention due to benign prostatic hyperplasia 09/28/2017  . Acute urinary retention 09/12/2017  . Sepsis ([place]) 09/12/2017  . Abdominal pain 09/12/2017  . Essential hypertension   . Diabetes mellitus without complication ([place])   . GERD (gastroesophageal reflux disease)     Past Surgical History:  Procedure Laterality Date  . CATARACT EXTRACTION W/ INTRAOCULAR LENS  IMPLANT, BILATERAL  03/2017  . EXICISION EPIDERMAL CYST LEFT THUMB  09-12-2001   dr sypher  [facility]  . INGUINAL HERNIA REPAIR Right 11-07-2007   dr Dalbert Batman [facility]  . KNEE ARTHROSCOPY Right 1990s  . TRANSURETHRAL RESECTION OF PROSTATE   07-26-2000  dr Amalia Hailey [facility]  . TRANSURETHRAL RESECTION OF PROSTATE N/A 09/28/2017   Procedure: BIPOLAR TRANSURETHRAL RESECTION OF THE PROSTATE (TURP);  Surgeon: Lucas Mallow, MD;  Location: [facility];  Service: Urology;  Laterality: N/A;       Family History  Problem Relation Age of Onset  . Pancreatic cancer Mother   . Melanoma Sister   . Lung cancer Brother     Social History   Tobacco Use  . Smoking status: Former Smoker    Years: 1.00    Types: Cigarettes    Quit date: 09/27/1967    Years since quitting: 52.3  . Smokeless tobacco: Never Used  Substance Use Topics  . Alcohol use: Yes    Comment: seldom  . Drug use: No    Home Medications Prior to Admission medications   Medication Sig Start Date End Date Taking? Authorizing Provider  acetaminophen (TYLENOL) 500 MG tablet Take 1 tablet (500 mg total) by mouth every 6 (six) hours as needed. 07/29/18  Yes Domenic Moras, PA-C  atorvastatin (LIPITOR) 10 MG tablet Take 5 mg every morning by mouth.    Yes [provider]  cholecalciferol (VITAMIN D) 1000 units tablet Take 1,000 Units by mouth See admin instructions. Taking daily except on Wednesday   Yes [provider]  lisinopril (PRINIVIL,ZESTRIL) 40 MG tablet Take 40 mg every  morning by mouth.    Yes [provider]  metFORMIN (GLUCOPHAGE) 500 MG tablet Take 2 (two) times daily with a meal by mouth. 1000 mg in the morning and 500 mg in the evening    Yes [provider]  Omega 3 1000 MG CAPS Take 1,000 mg by mouth in the morning and at bedtime.   Yes [provider]  pantoprazole (PROTONIX) 20 MG tablet Take 20 mg 2 (two) times daily by mouth.    Yes [provider]  Vitamin D, Ergocalciferol, (DRISDOL) 1.25 MG (50000 UNIT) CAPS capsule Take 50,000 Units by mouth every 7 (seven) days. Wednesday   Yes [provider]  cephALEXin (KEFLEX) 500 MG capsule Take 1 capsule (500 mg total) by mouth 3 (three)  times daily. Patient not taking: Reported on 01/11/2020 07/29/18   Domenic Moras, PA-C  ondansetron (ZOFRAN-ODT) 4 MG disintegrating tablet Take 1 tablet (4 mg total) by mouth every 8 (eight) hours as needed for nausea or vomiting. Patient not taking: Reported on 01/11/2020 09/06/17   Davonna Belling, MD    Allergies    Patient has no known allergies.  Review of Systems   Review of Systems  Constitutional: Negative for chills and fever.  HENT: Positive for congestion.   Eyes: Negative for visual disturbance.  Respiratory: Positive for cough. Negative for shortness of breath.   Cardiovascular: Negative for chest pain.  Gastrointestinal: Negative for abdominal pain and vomiting.  Genitourinary: Negative for dysuria and flank pain.  Musculoskeletal: Negative for back pain, neck pain and neck stiffness.  Skin: Negative for rash.  Neurological: Negative for light-headedness and headaches.  Psychiatric/Behavioral: Positive for confusion.    Physical Exam Updated Vital Signs BP (!) 160/75   Pulse 85   Temp 97.6 F (36.4 C) (Oral)   Resp 16   Ht 5\' 8"  (1.727 m)   Wt 63.5 kg   SpO2 100%   BMI 21.29 kg/m   Physical Exam Vitals and nursing note reviewed.  Constitutional:      Appearance: He is well-developed.  HENT:     Head: Normocephalic and atraumatic.  Eyes:     General:        Right eye: No discharge.        Left eye: No discharge.     Conjunctiva/sclera: Conjunctivae normal.  Neck:     Trachea: No tracheal deviation.  Cardiovascular:     Rate and Rhythm: Normal rate and regular rhythm.  Pulmonary:     Effort: Pulmonary effort is normal.     Breath sounds: Normal breath sounds.  Abdominal:     General: There is no distension.     Palpations: Abdomen is soft.     Tenderness: There is no abdominal tenderness. There is no guarding.  Musculoskeletal:        General: No swelling or tenderness.     Cervical back: Normal range of motion and neck supple.  Skin:     General: Skin is warm.     Capillary Refill: Capillary refill takes less than 2 seconds.     Findings: No rash.  Neurological:     General: No focal deficit present.     Mental Status: He is alert. He is disoriented.     GCS: GCS eye subscore is 4. GCS verbal subscore is 5. GCS motor subscore is 6.     Cranial Nerves: Cranial nerves are intact. No cranial nerve deficit.     Sensory: Sensation is intact.  Motor: Motor function is intact.     Coordination: Coordination is intact.     Comments: Patient answers questions appropriately.  Patient knows location, name, date of birth.  Psychiatric:        Mood and Affect: Mood normal.     ED Results / Procedures / Treatments   Labs (all labs ordered are listed, but only abnormal results are displayed) Labs Reviewed  COMPREHENSIVE METABOLIC PANEL - Abnormal; Notable for the following components:      Result Value   Glucose, Bld 151 (*)    Total Protein 5.4 (*)    Albumin 2.8 (*)    All other components within normal limits  CBC - Abnormal; Notable for the following components:   RBC 3.89 (*)    Hemoglobin 12.8 (*)    HCT 38.8 (*)    All other components within normal limits  URINALYSIS, ROUTINE W REFLEX MICROSCOPIC - Abnormal; Notable for the following components:   APPearance HAZY (*)    Glucose, UA 50 (*)    Protein, ur 30 (*)    Leukocytes,Ua LARGE (*)    WBC, UA >50 (*)    Bacteria, UA FEW (*)    All other components within normal limits  URINE CULTURE  SARS CORONAVIRUS 2 (TAT 6-24 HRS)  CBG MONITORING, ED    EKG None  Radiology CT Head Wo Contrast  Result Date: 01/11/2020 CLINICAL DATA:  Altered mental status. EXAM: CT HEAD WITHOUT CONTRAST TECHNIQUE: Contiguous axial images were obtained from the base of the skull through the vertex without intravenous contrast. COMPARISON:  CT scan dated 07/29/2018 FINDINGS: Brain: No evidence of acute infarction, hemorrhage, hydrocephalus, extra-axial collection or mass  lesion/mass effect. Diffuse moderate cerebral cortical atrophy with secondary ventricular dilatation, stable. Slight periventricular white matter lucency consistent with chronic small vessel ischemic disease, also stable. Vascular: No hyperdense vessel or unexpected calcification. Skull: No acute abnormality. Previous partial left mastoidectomy. Sinuses/Orbits: No acute finding. Other: None. IMPRESSION: No acute abnormalities. Moderate cerebral cortical atrophy. Slight chronic small vessel ischemic changes. Electronically Signed   By: Lorriane Shire M.D.   On: 01/11/2020 15:22   DG Chest Portable 1 View  Result Date: 01/11/2020 CLINICAL DATA:  86 year old male with history of cough. EXAM: PORTABLE CHEST 1 VIEW COMPARISON:  Chest x-ray 07/29/2018. FINDINGS: Lung volumes are normal. No consolidative airspace disease. No pleural effusions. No pneumothorax. No pulmonary nodule or mass noted. Pulmonary vasculature and the cardiomediastinal silhouette are within normal limits. Atherosclerosis in the thoracic aorta. IMPRESSION: 1.  No radiographic evidence of acute cardiopulmonary disease. 2. Aortic atherosclerosis. Electronically Signed   By: Vinnie Langton M.D.   On: 01/11/2020 14:54    Procedures Procedures (including critical care time)  Medications Ordered in ED Medications  cefTRIAXone (ROCEPHIN) 1 g in sodium chloride 0.9 % 100 mL IVPB (has no administration in time range)  sodium chloride 0.9 % bolus 500 mL (500 mLs Intravenous New Bag/Given 01/11/20 1505)    ED Course  I have reviewed the triage vital signs and the nursing notes.  Pertinent labs & imaging results that were available during my care of the patient were reviewed by me and considered in my medical decision making (see chart for details).    MDM Rules/Calculators/A&P                     Patient presents after episode of general confusion at home.  Discussed concern for possible urine infection versus intracranial versus  electrolyte versus  other.  Plan for blood work, CT scan of the head, urinalysis and urine culture.  Plan to try to talk to family. Blood work reviewed normal white blood cell count, hemoglobin 12.8.  Urinalysis consistent with infection culture sent.  CT scan results reviewed no acute findings. Discussed the case with patient's wife who said since yesterday he is been confused and has had urine infections before.  Patient has the cath twice a day due to prostate issues since surgery.  Patient also has had a few falls recently.  With elderly, falls, intermittent confusion and urine infection plan to admit to the hospital.  Hospitalist paged.  Patient normally sees [facility].    Final Clinical Impression(s) / ED Diagnoses Final diagnoses:  Lower urinary tract infectious disease  Acute encephalopathy    Rx / DC Orders ED Discharge Orders    None       Elnora Morrison, MD 01/11/20 1628

## 2020-01-11 NOTE — Progress Notes (Signed)
Robert Gross is a 86 y.o. male patient admitted. Awake, alert - oriented  X 4 - no acute distress noted.  VSS - Blood pressure (Abnormal) 162/76, pulse 68, temperature 98.1 F (36.7 C), temperature source Oral, resp. rate 20, height 5\' 8"  (1.727 m), weight 63.5 kg, SpO2 99 %.    IV in place, occlusive dsg intact without redness.       Will cont to eval and treat per MD orders.  Theora Master, RN 01/11/2020 6:38 PM

## 2020-01-11 NOTE — ED Notes (Signed)
Pt transported to CT ?

## 2020-01-11 NOTE — H&P (Signed)
History and Physical    Robert Gross [id number] DOB: Jan 14, 1929 DOA: 01/11/2020  PCP: [name], MD  Patient coming from: Home   Chief Complaint: confusion  HPI: Robert Gross is a 86 y.o. male with medical history significant of DM2, GERD, urinary retention and urge incontinence, HTN, BPH, arthritis presents for confusion.  The patient reports that his wife thought he was ill and called the ambulance, though he is not sure he is ill.  He notes that he has been having issues with urinary incontinence for a while and recently went to [facility] for care.  He continues to revert back to this story.  He is able to move in bed, afebrile.  He has a chronic cough, but he is not sure when it started.   I called his wife Robert Gross to get further information.  Per Robert Gross, Robert Gross has been having increased falls at home.   He was in the ED and then admitted in 2018 for a concussion and then a week later presented with urinary tract infection and sepsis.  Later in 2018, he was placed on a foley after a TURP procedure by Urology.  In 2019, he was seen in the ED again for a fall and a scalp laceration.  His wife feels these falls are getting more common and he fell twice in the last week.  She further notes increased memory loss, difficulty remembering personal information such as the PIN number for his debit card.  He has recently been evaluated by [facility] starting this month.  He was last seen on 01/07/20 and was advised to attempt self catheterization BID to help with his incontinence.  His wife states that he has been doing this and she feels he is doing it allright, but is not sure.    He has not had a fever, productive cough, nausea, vomiting, dysuria, abdominal pain, swelling, chest pain or other symptoms at this time.   ED Course: In the ED, he is somewhat confused and perseverating on his trip to [place] 5 days ago.  However, he remembers where he is and his birth year.  He remembers  the events of the ambulance ride.  He had a CT of his head which showed chronic and stable changes, no bleeding or acute stroke.  He had a UA which showed large leukocytes, yeast, mucus, WBC and leukocytes, neg LE.  He had a CXR which showed no acute findings.  He had an elevated blood sugar and mildly low protein and albumin.  UC was sent.  His vital signs were stable with a mildly elevated BP, not concerning for sepsis at this time.    Review of Systems: As per HPI otherwise 10 point review of systems negative.   Past Medical History:  Diagnosis Date  . Arthritis   . BPH with urinary obstruction   . Dizzy spells    residual from concussion 09-05-2017 intermittantly when turns head but stated as of 09-26-2017 no issues for past 2-3 days  . Essential hypertension   . Foley catheter in place   . GERD (gastroesophageal reflux disease)   . History of concussion    09-05-2017  w/ loc --- per pt residual intermittant dizziness when he turns his head either way  . History of prostatitis   . History of sepsis    09-12-2017  urosepsis  . History of urinary retention   . Type 2 diabetes mellitus ([place])   . Urinary retention   .  Wears partial dentures    upper    Past Surgical History:  Procedure Laterality Date  . CATARACT EXTRACTION W/ INTRAOCULAR LENS  IMPLANT, BILATERAL  03/2017  . EXICISION EPIDERMAL CYST LEFT THUMB  09-12-2001   dr sypher  [facility]  . INGUINAL HERNIA REPAIR Right 11-07-2007   dr Dalbert Batman [facility]  . KNEE ARTHROSCOPY Right 1990s  . TRANSURETHRAL RESECTION OF PROSTATE  07-26-2000  dr Amalia Hailey [facility]  . TRANSURETHRAL RESECTION OF PROSTATE N/A 09/28/2017   Procedure: BIPOLAR TRANSURETHRAL RESECTION OF THE PROSTATE (TURP);  Surgeon: Lucas Mallow, MD;  Location: [facility];  Service: Urology;  Laterality: N/A;     reports that he quit smoking about 52 years ago. His smoking use included cigarettes. He quit after 1.00 year of use. He has never used smokeless tobacco. He  reports current alcohol use. He reports that he does not use drugs.  No Known Allergies  Family History  Problem Relation Age of Onset  . Pancreatic cancer Mother   . Melanoma Sister   . Lung cancer Brother      Prior to Admission medications   Medication Sig Start Date End Date Taking? Authorizing Provider  acetaminophen (TYLENOL) 500 MG tablet Take 1 tablet (500 mg total) by mouth every 6 (six) hours as needed. 07/29/18  Yes Domenic Moras, PA-C  atorvastatin (LIPITOR) 10 MG tablet Take 5 mg every morning by mouth.    Yes [provider]  cholecalciferol (VITAMIN D) 1000 units tablet Take 1,000 Units by mouth See admin instructions. Taking daily except on Wednesday   Yes [provider]  lisinopril (PRINIVIL,ZESTRIL) 40 MG tablet Take 40 mg every morning by mouth.    Yes [provider]  metFORMIN (GLUCOPHAGE) 500 MG tablet Take 2 (two) times daily with a meal by mouth. 1000 mg in the morning and 500 mg in the evening    Yes [provider]  Omega 3 1000 MG CAPS Take 1,000 mg by mouth in the morning and at bedtime.   Yes [provider]  pantoprazole (PROTONIX) 20 MG tablet Take 20 mg 2 (two) times daily by mouth.    Yes [provider]  Vitamin D, Ergocalciferol, (DRISDOL) 1.25 MG (50000 UNIT) CAPS capsule Take 50,000 Units by mouth every 7 (seven) days. Wednesday   Yes [provider]                  Physical Exam: Vitals:   01/11/20 1700 01/11/20 1715 01/11/20 1730 01/11/20 1827  BP: (!) 167/84  (!) 152/72 (!) 162/76  Pulse: 87 84 78 68  Resp: 19 (!) 23 (!) 21 20  Temp:    98.1 F (36.7 C)  TempSrc:    Oral  SpO2: 99% 98% 91% 99%  Weight:      Height:        Constitutional: NAD, calm, comfortable, lying in bed Eyes: EOMI, lids normal.  He has conjunctival injection in the left lateral eye ENMT: Mucous membranes are mildly dry, neck is supple Respiratory: CTAB, no wheezing, mild crackles at bases, cleared with  coughing, normal respiratory effort Cardiovascular: RR, NR, no murmur, pulses are intact in the DP and radial arteries, no peripheral edema Abdomen: +BS, NT, ND Musculoskeletal: no clubbing / cyanosis  Normal muscle tone for age Skin: He has abundant SKs and AKs on his neck, face, scalp.  Otherwise, no rash GU:  He is non circumcised, foreskin easily retractable, whitish plaque on penile glans, no  discharge from meatus Neurologic: Grossly intact, able to move easily in bed.  Strength is 5/5 throughout.  Psychiatric: Impaired jugement and insight into current issues.  Alert and oriented x 2.    Labs on Admission: I have personally reviewed following labs and imaging studies  CBC: Recent Labs  Lab 01/11/20 1422  WBC 10.1  HGB 12.8*  HCT 38.8*  MCV 99.7  PLT 99991111   Basic Metabolic Panel: Recent Labs  Lab 01/11/20 1422  NA 141  K 4.1  CL 108  CO2 25  GLUCOSE 151*  BUN 21  CREATININE 1.00  CALCIUM 10.0   GFR: Estimated Creatinine Clearance: 45.9 mL/min (by C-G formula based on SCr of 1 mg/dL). Liver Function Tests: Recent Labs  Lab 01/11/20 1422  AST 16  ALT 14  ALKPHOS 69  BILITOT 0.7  PROT 5.4*  ALBUMIN 2.8*   No results for input(s): LIPASE, AMYLASE in the last 168 hours. No results for input(s): AMMONIA in the last 168 hours. Coagulation Profile: No results for input(s): INR, PROTIME in the last 168 hours. Cardiac Enzymes: No results for input(s): CKTOTAL, CKMB, CKMBINDEX, TROPONINI in the last 168 hours. BNP (last 3 results) No results for input(s): PROBNP in the last 8760 hours. HbA1C: No results for input(s): HGBA1C in the last 72 hours. CBG: No results for input(s): GLUCAP in the last 168 hours. Lipid Profile: No results for input(s): CHOL, HDL, LDLCALC, TRIG, CHOLHDL, LDLDIRECT in the last 72 hours. Thyroid Function Tests: No results for input(s): TSH, T4TOTAL, FREET4, T3FREE, THYROIDAB in the last 72 hours. Anemia Panel: No results for input(s):  VITAMINB12, FOLATE, FERRITIN, TIBC, IRON, RETICCTPCT in the last 72 hours. Urine analysis:    Component Value Date/Time   COLORURINE YELLOW 01/11/2020 1432   APPEARANCEUR HAZY (A) 01/11/2020 1432   LABSPEC 1.019 01/11/2020 1432   PHURINE 6.0 01/11/2020 1432   GLUCOSEU 50 (A) 01/11/2020 1432   HGBUR NEGATIVE 01/11/2020 1432   BILIRUBINUR NEGATIVE 01/11/2020 1432   KETONESUR NEGATIVE 01/11/2020 1432   PROTEINUR 30 (A) 01/11/2020 1432   UROBILINOGEN 0.2 11/03/2007 2123   NITRITE NEGATIVE 01/11/2020 1432   LEUKOCYTESUR LARGE (A) 01/11/2020 1432    Radiological Exams on Admission: CT Head Wo Contrast  Result Date: 01/11/2020 CLINICAL DATA:  Altered mental status. EXAM: CT HEAD WITHOUT CONTRAST TECHNIQUE: Contiguous axial images were obtained from the base of the skull through the vertex without intravenous contrast. COMPARISON:  CT scan dated 07/29/2018 FINDINGS: Brain: No evidence of acute infarction, hemorrhage, hydrocephalus, extra-axial collection or mass lesion/mass effect. Diffuse moderate cerebral cortical atrophy with secondary ventricular dilatation, stable. Slight periventricular white matter lucency consistent with chronic small vessel ischemic disease, also stable. Vascular: No hyperdense vessel or unexpected calcification. Skull: No acute abnormality. Previous partial left mastoidectomy. Sinuses/Orbits: No acute finding. Other: None. IMPRESSION: No acute abnormalities. Moderate cerebral cortical atrophy. Slight chronic small vessel ischemic changes. Electronically Signed   By: Lorriane Shire M.D.   On: 01/11/2020 15:22   DG Chest Portable 1 View  Result Date: 01/11/2020 CLINICAL DATA:  86 year old male with history of cough. EXAM: PORTABLE CHEST 1 VIEW COMPARISON:  Chest x-ray 07/29/2018. FINDINGS: Lung volumes are normal. No consolidative airspace disease. No pleural effusions. No pneumothorax. No pulmonary nodule or mass noted. Pulmonary vasculature and the cardiomediastinal  silhouette are within normal limits. Atherosclerosis in the thoracic aorta. IMPRESSION: 1.  No radiographic evidence of acute cardiopulmonary disease. 2. Aortic atherosclerosis. Electronically Signed   By: Mauri Brooklyn.D.  On: 01/11/2020 14:54    Assessment/Plan Encephalopathy acute UTI Urinary incontinence, hypotonic bladder - It appears the cause of his UTI is likely related to recent initiation of self catheterization. - UA shows pyuria and yeast - Will treat as cystitis at this time - Avoid catheterization, unless with urinary retention - Fluconazole 200mg  daily - Rocephin 1gm IV daily - Follow culture  - Check TSH  Essential hypertension - BP elevated, will restart home medication of lisinopril  - Monitor renal function    Diabetes mellitus without complication  - Hold metformin at this time - Start SSI - Soft diet until he is more coherent  GERD - Continue Protonix BID  Deconditioning Memory issues - PT/OT evaluation - Exacerbated by UTI, but family feels this has been worsening  Chronic Cough - Unclear cause, he is on protonix BID, no thrush on exam - Other possibilities include allergic rhinitis and cough varian asthma - Further work up outpatient - Follow up SARS-Cov     DVT prophylaxis: Lovenox  Code Status: Full (confirmed with wife Robert Gross)  Family Communication: Robert Gross on the phone  Disposition Plan: Admit for Abx  Consults called: None  Admission status: Obs, Med-Surg   Gilles Chiquito MD [facility]  If 7PM-7AM, please contact night-coverage [email or web address] Use universal [place] password for that web site. If you do not have the password, please call the hospital operator.  01/11/2020, 6:44 PM

## 2020-01-12 DIAGNOSIS — G9340 Encephalopathy, unspecified: Secondary | ICD-10-CM | POA: Diagnosis not present

## 2020-01-12 DIAGNOSIS — E119 Type 2 diabetes mellitus without complications: Secondary | ICD-10-CM | POA: Diagnosis not present

## 2020-01-12 DIAGNOSIS — K219 Gastro-esophageal reflux disease without esophagitis: Secondary | ICD-10-CM

## 2020-01-12 DIAGNOSIS — I10 Essential (primary) hypertension: Secondary | ICD-10-CM

## 2020-01-12 DIAGNOSIS — B3749 Other urogenital candidiasis: Secondary | ICD-10-CM | POA: Diagnosis present

## 2020-01-12 DIAGNOSIS — N390 Urinary tract infection, site not specified: Secondary | ICD-10-CM

## 2020-01-12 DIAGNOSIS — R413 Other amnesia: Secondary | ICD-10-CM | POA: Diagnosis present

## 2020-01-12 DIAGNOSIS — W19XXXD Unspecified fall, subsequent encounter: Secondary | ICD-10-CM

## 2020-01-12 DIAGNOSIS — W19XXXA Unspecified fall, initial encounter: Secondary | ICD-10-CM | POA: Diagnosis present

## 2020-01-12 LAB — GLUCOSE, CAPILLARY
Glucose-Capillary: 111 mg/dL — ABNORMAL HIGH (ref 70–99)
Glucose-Capillary: 160 mg/dL — ABNORMAL HIGH (ref 70–99)
Glucose-Capillary: 212 mg/dL — ABNORMAL HIGH (ref 70–99)
Glucose-Capillary: 72 mg/dL (ref 70–99)
Glucose-Capillary: 89 mg/dL (ref 70–99)
Glucose-Capillary: 97 mg/dL (ref 70–99)

## 2020-01-12 LAB — CBC
HCT: 37 % — ABNORMAL LOW (ref 39.0–52.0)
Hemoglobin: 12.4 g/dL — ABNORMAL LOW (ref 13.0–17.0)
MCH: 32.7 pg (ref 26.0–34.0)
MCHC: 33.5 g/dL (ref 30.0–36.0)
MCV: 97.6 fL (ref 80.0–100.0)
Platelets: 167 10*3/uL (ref 150–400)
RBC: 3.79 MIL/uL — ABNORMAL LOW (ref 4.22–5.81)
RDW: 12.8 % (ref 11.5–15.5)
WBC: 8.5 10*3/uL (ref 4.0–10.5)
nRBC: 0 % (ref 0.0–0.2)

## 2020-01-12 LAB — URINE CULTURE: Culture: <10000 — AB

## 2020-01-12 LAB — BASIC METABOLIC PANEL
Anion gap: 10 (ref 5–15)
BUN: 14 mg/dL (ref 8–23)
CO2: 21 mmol/L — ABNORMAL LOW (ref 22–32)
Calcium: 9.3 mg/dL (ref 8.9–10.3)
Chloride: 109 mmol/L (ref 98–111)
Creatinine, Ser: 0.83 mg/dL (ref 0.61–1.24)
GFR calc Af Amer: >60 mL/min (ref >60)
GFR calc non Af Amer: >60 mL/min (ref >60)
Glucose, Bld: 193 mg/dL — ABNORMAL HIGH (ref 70–99)
Potassium: 3.4 mmol/L — ABNORMAL LOW (ref 3.5–5.1)
Sodium: 140 mmol/L (ref 135–145)

## 2020-01-12 LAB — TSH: TSH: 5.004 u[IU]/mL — ABNORMAL HIGH (ref 0.350–4.500)

## 2020-01-12 MED ORDER — CHLORHEXIDINE GLUCONATE CLOTH 2 % EX PADS
6.0000 | MEDICATED_PAD | Freq: Every day | CUTANEOUS | Status: DC
  Administered 2020-01-12 – 2020-01-13 (×2): 6 via TOPICAL

## 2020-01-12 MED ORDER — FLUCONAZOLE 100 MG PO TABS
100.0000 mg | ORAL_TABLET | Freq: Every day | ORAL | Status: DC
  Administered 2020-01-12 – 2020-01-13 (×2): 100 mg via ORAL
  Filled 2020-01-12 (×2): qty 1

## 2020-01-12 MED ORDER — POTASSIUM CHLORIDE CRYS ER 20 MEQ PO TBCR
40.0000 meq | EXTENDED_RELEASE_TABLET | Freq: Once | ORAL | Status: AC
  Administered 2020-01-12: 10:00:00 40 meq via ORAL
  Filled 2020-01-12: qty 2

## 2020-01-12 MED ORDER — HALOPERIDOL LACTATE 5 MG/ML IJ SOLN
5.0000 mg | Freq: Once | INTRAMUSCULAR | Status: AC
  Administered 2020-01-12: 5 mg via INTRAVENOUS
  Filled 2020-01-12: qty 1

## 2020-01-12 NOTE — Evaluation (Signed)
Physical Therapy Evaluation Patient Details Name: Robert Gross MRN: [id number] DOB: November 23, 1928 Today's Date: 01/12/2020   History of Present Illness  The pt is an 86 yo male presenting from home with AMS, dx with UTI upon admission. PMH includes diabetes, HTN, urinary retention, arthritis, urine infection, and frequent falls.  Clinical Impression  Pt in bed upon arrival of PT/OT, agreeable to evaluation at this time. The pt presents with limitations in functional mobility, static and dynamic stability, and endurance compared to his prior level of function and independence due to above dx. The pt's course is further complicated by cognitive deficits in memory and safety awareness, both with further increase his risk for continued falls. The pt was able to demonstrate good bed mobility and transfers, but benefits from minG and use of RW for all transfers and ambulation due to instability. The pt will continue to benefit from skilled PT to maximize functional strength and safety with mobility.     Follow Up Recommendations Home health PT;Supervision/Assistance - 24 hour    Equipment Recommendations  Rolling walker with 5" wheels(pt is unreliable historian, RW if he does not have one)    Recommendations for Other Services       Precautions / Restrictions Precautions Precautions: Fall Restrictions Weight Bearing Restrictions: No      Mobility  Bed Mobility Overal bed mobility: Needs Assistance Bed Mobility: Supine to Sit     Supine to sit: Min guard     General bed mobility comments: Min Guard A for safety  Transfers Overall transfer level: Needs assistance Equipment used: Rolling walker (2 wheeled) Transfers: Sit to/from Stand Sit to Stand: Min guard         General transfer comment: Min Guard A for safety. cues for hand placement  Ambulation/Gait Ambulation/Gait assistance: Min guard Gait Distance (Feet): 150 Feet Assistive device: Rolling walker (2 wheeled) Gait  Pattern/deviations: Step-through pattern;Trunk flexed   Gait velocity interpretation: 1.31 - 2.62 ft/sec, indicative of limited community ambulator General Gait Details: Pt with poor RW managment despite repeated cues, ambulates with sig trunk flexion and with the RW out in front of him. Ambulates with normal gait speed until distracted by external stimuli  Stairs            Wheelchair Mobility    Modified Rankin (Stroke Patients Only)       Balance Overall balance assessment: Needs assistance Sitting-balance support: No upper extremity supported;Feet supported Sitting balance-Leahy Scale: Fair     Standing balance support: Bilateral upper extremity supported;During functional activity;No upper extremity supported Standing balance-Leahy Scale: Fair Standing balance comment: able to maintain static standing without UE support                             Pertinent Vitals/Pain Pain Assessment: No/denies pain Faces Pain Scale: No hurt Pain Intervention(s): Limited activity within patient's tolerance;Monitored during session    [place] expects to be discharged to:: Private residence Living Arrangements: Spouse/significant other Available Help at Discharge: Family;Available 24 hours/day Type of Home: House Home Access: Ramped entrance;Stairs to enter     Home Layout: One level Home Equipment: Cane - single point Additional Comments: pt may have a RW, reports he used it sometimes at home despite initially denying having  aRW    Prior Function Level of Independence: Independent with assistive device(s)         Comments: Uses two canes at one time. Performs BADLs  Hand Dominance   Dominant Hand: Left    Extremity/Trunk Assessment   Upper Extremity Assessment Upper Extremity Assessment: Overall WFL for tasks assessed    Lower Extremity Assessment Lower Extremity Assessment: Overall WFL for tasks assessed(generally functional,  poor muscular endurance)    Cervical / Trunk Assessment Cervical / Trunk Assessment: Normal  Communication   Communication: No difficulties  Cognition Arousal/Alertness: Awake/alert Behavior During Therapy: WFL for tasks assessed/performed Overall Cognitive Status: Impaired/Different from baseline Area of Impairment: Memory;Following commands;Safety/judgement;Awareness;Problem solving;Attention                   Current Attention Level: Sustained Memory: Decreased short-term memory;Decreased recall of precautions Following Commands: Follows one step commands with increased time;Follows multi-step commands inconsistently Safety/Judgement: Decreased awareness of deficits;Decreased awareness of safety Awareness: Emergent Problem Solving: Slow processing;Requires verbal cues General Comments: Oriented to place and self. Benefits from simple, direct cues. Becomes easily distracted by stimuli. Very pleasant and willing to participate in therapy      General Comments General comments (skin integrity, edema, etc.): IV site bleeding, RN removed and bandaged    Exercises     Assessment/Plan    PT Assessment Patient needs continued PT services  PT Problem List Decreased strength;Decreased mobility;Decreased safety awareness;Decreased coordination;Decreased activity tolerance;Decreased cognition;Decreased balance       PT Treatment Interventions DME instruction;Therapeutic exercise;Gait training;Balance training;Stair training;Functional mobility training;Cognitive remediation;Therapeutic activities;Patient/family education    PT Goals (Current goals can be found in the Care Plan section)  Acute Rehab PT Goals Patient Stated Goal: Go home PT Goal Formulation: With patient Time For Goal Achievement: 01/26/20 Potential to Achieve Goals: Good    Frequency Min 3X/week   Barriers to discharge        Co-evaluation PT/OT/SLP Co-Evaluation/Treatment: Yes Reason for  Co-Treatment: For patient/therapist safety;Necessary to address cognition/behavior during functional activity           AM-PAC PT "6 Clicks" Mobility  Outcome Measure Help needed turning from your back to your side while in a flat bed without using bedrails?: None Help needed moving from lying on your back to sitting on the side of a flat bed without using bedrails?: None Help needed moving to and from a bed to a chair (including a wheelchair)?: A Little Help needed standing up from a chair using your arms (e.g., wheelchair or bedside chair)?: A Little Help needed to walk in hospital room?: A Little Help needed climbing 3-5 steps with a railing? : A Little 6 Click Score: 20    End of Session Equipment Utilized During Treatment: Gait belt Activity Tolerance: Patient tolerated treatment well Patient left: in chair;with call bell/phone within reach;with chair alarm set Nurse Communication: Mobility status;Other (comment)(cognition) PT Visit Diagnosis: Repeated falls (R29.6);Difficulty in walking, not elsewhere classified (R26.2)    Time: [id number] PT Time Calculation (min) (ACUTE ONLY): 32 min   Charges:   PT Evaluation $PT Eval Moderate Complexity: 1 Mod          Karma Ganja, PT, DPT   Acute Rehabilitation Department Pager #: [phone]   Otho Bellows 01/12/2020, 12:53 PM

## 2020-01-12 NOTE — Progress Notes (Signed)
PROGRESS NOTE    Robert Gross  APO:[id number] DOB: 28-Aug-1929 DOA: 01/11/2020 PCP: [name], MD    Brief Narrative:  HPI per Dr. Angelyn Punt is a 86 y.o. male with medical history significant of DM2, GERD, urinary retention and urge incontinence, HTN, BPH, arthritis presents for confusion.  The patient reported that his wife thought he was ill and called the ambulance, though he is not sure he is ill.  He notes that he has been having issues with urinary incontinence for a while and recently went to [facility] for care.  He continues to revert back to this story.  He is able to move in bed, afebrile.  He has a chronic cough, but he is not sure when it started.   I called his wife Robert Gross to get further information.  Per Robert Gross, Mr. Reihl has been having increased falls at home.   He was in the ED and then admitted in 2018 for a concussion and then a week later presented with urinary tract infection and sepsis.  Later in 2018, he was placed on a foley after a TURP procedure by Urology.  In 2019, he was seen in the ED again for a fall and a scalp laceration.  His wife feels these falls are getting more common and he fell twice in the last week.  She further notes increased memory loss, difficulty remembering personal information such as the PIN number for his debit card.  He has recently been evaluated by [facility] starting this month.  He was last seen on 01/07/20 and was advised to attempt self catheterization BID to help with his incontinence.  His wife states that he has been doing this and she feels he is doing it allright, but is not sure.    He has not had a fever, productive cough, nausea, vomiting, dysuria, abdominal pain, swelling, chest pain or other symptoms at this time.   ED Course: In the ED, he is somewhat confused and perseverating on his trip to [place] 5 days ago.  However, he remembers where he is and his birth year.  He remembers the events of the ambulance  ride.  He had a CT of his head which showed chronic and stable changes, no bleeding or acute stroke.  He had a UA which showed large leukocytes, yeast, mucus, WBC and leukocytes, neg LE.  He had a CXR which showed no acute findings.  He had an elevated blood sugar and mildly low protein and albumin.  UC was sent.  His vital signs were stable with a mildly elevated BP, not concerning for sepsis at this time.    Assessment & Plan:   Active Problems:   Essential hypertension   Diabetes mellitus without complication ([place])   Acute encephalopathy   Lower urinary tract infectious disease  1 acute metabolic encephalopathy Likely secondary to acute UTI in the setting of probable baseline dementia/memory issues.  CT head which was done was negative for any acute abnormalities however did show moderate cerebral cortical atrophy.  Urinalysis concerning for UTI and also candiduria.  Urine cultures with less than 10,000 colonies/insignificant growth however will treat empirically.  Continue IV Rocephin and likely transition to oral antibiotics tomorrow.  Change IV fluconazole to oral Diflucan 100 mg daily.  Supportive care.  Follow.  2.  UTI/candiduria Urine cultures with less than 10,000 colonies of insignificant growth have a urinalysis highly suggestive of UTI.  Patient currently afebrile.  Continue IV Rocephin and  transition to oral antibiotics tomorrow.  Change IV Diflucan to oral Diflucan.  Follow.  3.  Well-controlled diabetes mellitus type 2 Hemoglobin A1c 5.9.  CBG of 212 this morning.  Continue to hold oral hypoglycemic agents.  Sliding scale insulin.  4.  Hypertension Continue lisinopril.  5.  Gastroesophageal reflux disease PPI.  6.  Memory issues/??  Dementia CT head with moderate cerebral cortical atrophy.  Patient with likely worsening memory issues in the setting of UTI.  PT/OT.  Continue empiric IV antibiotics.  Follow.  7.  Chronic cough Unclear source.  Continue PPI twice daily.   Chest x-ray negative.  Continue PPI.  Add Claritin.  Follow.  8.  Falls PT/OT.   DVT prophylaxis: Lovenox Code Status: Full Family Communication: Updated patient.  No family at bedside. Disposition Plan:  . Patient came from: Home            . Anticipated d/c place: Home . Barriers to d/c OR conditions which need to be met to effect a safe d/c: Home when clinically improved and on oral antibiotics.   Consultants:   None  Procedures:   CT head 01/11/2020  Chest x-ray 01/11/2020  Antimicrobials:   IV Rocephin 01/11/2020  IV fluconazole 01/11/2020>>>> oral fluconazole 01/12/2020   Subjective: Patient sitting up in chair denies any chest pain.  States complains of some difficulty with his breathing and then in the next sentence denies any shortness of breath.  Alert only to self.  Thinks he is in [place].  Patient does endorse increasing falls recently.  Objective: Vitals:   01/11/20 1827 01/11/20 2119 01/12/20 0429 01/12/20 1518  BP: (!) 162/76 (!) 144/70 131/84 (!) 177/79  Pulse: 68 79 71 86  Resp: '20 14 15 18  '$ Temp: 98.1 F (36.7 C) 97.8 F (36.6 C) 98 F (36.7 C) 97.8 F (36.6 C)  TempSrc: Oral Oral Oral Oral  SpO2: 99% 95% 96% 94%  Weight:      Height:        Intake/Output Summary (Last 24 hours) at 01/12/2020 1826 Last data filed at 01/12/2020 1425 Gross per 24 hour  Intake 1160 ml  Output 800 ml  Net 360 ml   Filed Weights   01/11/20 1411  Weight: 63.5 kg    Examination:  General exam: Appears calm and comfortable  Respiratory system: Clear to auscultation. Respiratory effort normal. Cardiovascular system: S1 & S2 heard, RRR. No JVD, murmurs, rubs, gallops or clicks. No pedal edema. Gastrointestinal system: Abdomen is nondistended, soft and nontender. No organomegaly or masses felt. Normal bowel sounds heard. Central nervous system: Alert and oriented to self only. No focal neurological deficits.  Moving extremities spontaneously. Extremities:  Symmetric 5 x 5 power. Skin: No rashes, lesions or ulcers Psychiatry: Judgement and insight appear poor to fair. Mood & affect appropriate.     Data Reviewed: I have personally reviewed following labs and imaging studies  CBC: Recent Labs  Lab 01/11/20 1422 01/12/20 0200  WBC 10.1 8.5  HGB 12.8* 12.4*  HCT 38.8* 37.0*  MCV 99.7 97.6  PLT 180 622   Basic Metabolic Panel: Recent Labs  Lab 01/11/20 1422 01/12/20 0200  NA 141 140  K 4.1 3.4*  CL 108 109  CO2 25 21*  GLUCOSE 151* 193*  BUN 21 14  CREATININE 1.00 0.83  CALCIUM 10.0 9.3   GFR: Estimated Creatinine Clearance: 55.3 mL/min (by C-G formula based on SCr of 0.83 mg/dL). Liver Function Tests: Recent Labs  Lab 01/11/20 1422  AST 16  ALT 14  ALKPHOS 69  BILITOT 0.7  PROT 5.4*  ALBUMIN 2.8*   No results for input(s): LIPASE, AMYLASE in the last 168 hours. No results for input(s): AMMONIA in the last 168 hours. Coagulation Profile: No results for input(s): INR, PROTIME in the last 168 hours. Cardiac Enzymes: No results for input(s): CKTOTAL, CKMB, CKMBINDEX, TROPONINI in the last 168 hours. BNP (last 3 results) No results for input(s): PROBNP in the last 8760 hours. HbA1C: Recent Labs    01/11/20 1900  HGBA1C 5.9*   CBG: Recent Labs  Lab 01/12/20 0001 01/12/20 0425 01/12/20 0805 01/12/20 1158 01/12/20 1641  GLUCAP 89 97 212* 72 111*   Lipid Profile: No results for input(s): CHOL, HDL, LDLCALC, TRIG, CHOLHDL, LDLDIRECT in the last 72 hours. Thyroid Function Tests: Recent Labs    01/12/20 0200  TSH 5.004*   Anemia Panel: No results for input(s): VITAMINB12, FOLATE, FERRITIN, TIBC, IRON, RETICCTPCT in the last 72 hours. Sepsis Labs: No results for input(s): PROCALCITON, LATICACIDVEN in the last 168 hours.  Recent Results (from the past 240 hour(s))  Urine culture     Status: Abnormal   Collection Time: 01/11/20  2:32 PM   Specimen: Urine, Random  Result Value Ref Range Status    Specimen Description URINE, RANDOM  Final   Special Requests NONE  Final   Culture (A)  Final    <10,000 COLONIES/mL INSIGNIFICANT GROWTH Performed at [facility], [address], [place], [place] [address]    Report Status 01/12/2020 FINAL  Final  SARS CORONAVIRUS 2 (TAT 6-24 HRS) Nasopharyngeal Nasopharyngeal Swab     Status: None   Collection Time: 01/11/20  2:35 PM   Specimen: Nasopharyngeal Swab  Result Value Ref Range Status   SARS Coronavirus 2 NEGATIVE NEGATIVE Final    Comment: (NOTE) SARS-CoV-2 target nucleic acids are NOT DETECTED. The SARS-CoV-2 RNA is generally detectable in upper and lower respiratory specimens during the acute phase of infection. Negative results do not preclude SARS-CoV-2 infection, do not rule out co-infections with other pathogens, and should not be used as the sole basis for treatment or other patient management decisions. Negative results must be combined with clinical observations, patient history, and epidemiological information. The expected result is Negative. Fact Sheet for Patients: SugarRoll.be Fact Sheet for Healthcare Providers: https://www.woods-mathews.com/ This test is not yet approved or cleared by the [place] FDA and  has been authorized for detection and/or diagnosis of SARS-CoV-2 by FDA under an Emergency Use Authorization (EUA). This EUA will remain  in effect (meaning this test can be used) for the duration of the COVID-19 declaration under Section 56 4(b)(1) of the Act, 21 U.S.C. section 360bbb-3(b)(1), unless the authorization is terminated or revoked sooner. Performed at [facility], [place] [address], [place], [place] [address]          Radiology Studies: CT Head Wo Contrast  Result Date: 01/11/2020 CLINICAL DATA:  Altered mental status. EXAM: CT HEAD WITHOUT CONTRAST TECHNIQUE: Contiguous axial images were obtained from the base of the skull through the  vertex without intravenous contrast. COMPARISON:  CT scan dated 07/29/2018 FINDINGS: Brain: No evidence of acute infarction, hemorrhage, hydrocephalus, extra-axial collection or mass lesion/mass effect. Diffuse moderate cerebral cortical atrophy with secondary ventricular dilatation, stable. Slight periventricular white matter lucency consistent with chronic small vessel ischemic disease, also stable. Vascular: No hyperdense vessel or unexpected calcification. Skull: No acute abnormality. Previous partial left mastoidectomy. Sinuses/Orbits: No acute finding. Other: None.  IMPRESSION: No acute abnormalities. Moderate cerebral cortical atrophy. Slight chronic small vessel ischemic changes. Electronically Signed   By: Lorriane Shire M.D.   On: 01/11/2020 15:22   DG Chest Portable 1 View  Result Date: 01/11/2020 CLINICAL DATA:  86 year old male with history of cough. EXAM: PORTABLE CHEST 1 VIEW COMPARISON:  Chest x-ray 07/29/2018. FINDINGS: Lung volumes are normal. No consolidative airspace disease. No pleural effusions. No pneumothorax. No pulmonary nodule or mass noted. Pulmonary vasculature and the cardiomediastinal silhouette are within normal limits. Atherosclerosis in the thoracic aorta. IMPRESSION: 1.  No radiographic evidence of acute cardiopulmonary disease. 2. Aortic atherosclerosis. Electronically Signed   By: Vinnie Langton M.D.   On: 01/11/2020 14:54        Scheduled Meds: . atorvastatin  5 mg Oral q morning - 10a  . Chlorhexidine Gluconate Cloth  6 each Topical Daily  . enoxaparin (LOVENOX) injection  40 mg Subcutaneous Q24H  . insulin aspart  0-9 Units Subcutaneous Q4H  . lisinopril  40 mg Oral q morning - 10a  . pantoprazole  20 mg Oral BID   Continuous Infusions: . cefTRIAXone (ROCEPHIN)  IV 1 g (01/11/20 1958)  . fluconazole (DIFLUCAN) IV 200 mg (01/11/20 2037)     LOS: 0 days    Time spent: 35 minutes    Irine Seal, MD [facility]   To contact the  attending provider between 7A-7P or the covering provider during after hours 7P-7A, please log into the web site [email or web address] and access using universal [place] password for that web site. If you do not have the password, please call the hospital operator.  01/12/2020, 6:26 PM

## 2020-01-12 NOTE — Evaluation (Signed)
Occupational Therapy Evaluation Patient Details Name: Robert Gross MRN: [id number] DOB: 03/03/1929 Today's Date: 01/12/2020    History of Present Illness The pt is an 86 yo male presenting from home with AMS, dx with UTI upon admission. PMH includes diabetes, HTN, urinary retention, arthritis, urine infection, and frequent falls.   Clinical Impression   PTA, pt was living with his wife and was performing BADLs and using two canes for mobility; unsure of reliability due to confusion and poor historian. Pt currently requiring Min Guard A for LB ADLs and functional mobility with RW. Pt presenting with decreased cognition, safety, balance, and strength. Pt would benefit from further acute OT to facilitate safe dc. Pending 24/7 supervision and support, recommend dc to home with [place] for further OT to optimize safety, independence with ADLs, and return to PLOF.       Follow Up Recommendations  Home health OT;Supervision/Assistance - 24 hour ; Will need 24/7 supervision.   Equipment Recommendations  3 in 1 bedside commode    Recommendations for Other Services PT consult     Precautions / Restrictions Precautions Precautions: Fall      Mobility Bed Mobility Overal bed mobility: Needs Assistance Bed Mobility: Supine to Sit     Supine to sit: Min guard     General bed mobility comments: Min Guard A for safety  Transfers Overall transfer level: Needs assistance Equipment used: Rolling walker (2 wheeled) Transfers: Sit to/from Stand Sit to Stand: Min guard         General transfer comment: Min Guard A for safety. cues for hand placement    Balance Overall balance assessment: Needs assistance Sitting-balance support: No upper extremity supported;Feet supported Sitting balance-Leahy Scale: Fair     Standing balance support: Bilateral upper extremity supported;During functional activity;No upper extremity supported Standing balance-Leahy Scale: Fair Standing balance  comment: able to maintain static standing without UE support                           ADL either performed or assessed with clinical judgement   ADL Overall ADL's : Needs assistance/impaired Eating/Feeding: Set up;Sitting   Grooming: Oral care;Min guard;Standing   Upper Body Bathing: Supervision/ safety;Set up;Sitting   Lower Body Bathing: Min guard;Sit to/from stand   Upper Body Dressing : Set up;Supervision/safety;Sitting Upper Body Dressing Details (indicate cue type and reason): donned second gown like a jacket Lower Body Dressing: Min guard;Sit to/from stand   Toilet Transfer: Min guard;Ambulation;RW(simulated to recliner)           Functional mobility during ADLs: Min guard;Rolling walker General ADL Comments: Pt presenting with decreased cognition and balance     Vision Baseline Vision/History: No visual deficits(cataract sx) Patient Visual Report: No change from baseline       Perception     Praxis      Pertinent Vitals/Pain Pain Assessment: Faces Faces Pain Scale: No hurt Pain Intervention(s): Monitored during session     Hand Dominance     Extremity/Trunk Assessment Upper Extremity Assessment Upper Extremity Assessment: Overall WFL for tasks assessed   Lower Extremity Assessment Lower Extremity Assessment: Defer to PT evaluation   Cervical / Trunk Assessment Cervical / Trunk Assessment: Normal   Communication Communication Communication: No difficulties   Cognition Arousal/Alertness: Awake/alert Behavior During Therapy: WFL for tasks assessed/performed Overall Cognitive Status: Impaired/Different from baseline Area of Impairment: Memory;Following commands;Safety/judgement;Awareness;Problem solving;Attention  Current Attention Level: Sustained Memory: Decreased short-term memory;Decreased recall of precautions Following Commands: Follows one step commands with increased time;Follows multi-step commands  inconsistently Safety/Judgement: Decreased awareness of deficits;Decreased awareness of safety Awareness: Emergent Problem Solving: Slow processing;Requires verbal cues General Comments: Oriented to place and self. Benefits fro msimple, direct cues. Becomes easily distracted by stimuli. Very pleasant and willing to participate in therapy   General Comments  IV site bleeding; RN notified and took IV out    Exercises     Shoulder Instructions      Home Living Family/patient expects to be discharged to:: Private residence Living Arrangements: Spouse/significant other Available Help at Discharge: Family;Available 24 hours/day Type of Home: House Home Access: Ramped entrance;Stairs to enter     Home Layout: One level     Bathroom Shower/Tub: Occupational psychologist: Handicapped height     Home Equipment: Muddy - single point(two canes at a time)          Prior Functioning/Environment Level of Independence: Independent with assistive device(s)        Comments: Uses two canes at one time. Performs BADLs        OT Problem List: Decreased strength;Decreased range of motion;Decreased activity tolerance;Decreased knowledge of use of DME or AE;Decreased knowledge of precautions;Decreased cognition;Impaired balance (sitting and/or standing)      OT Treatment/Interventions: Self-care/ADL training;Therapeutic exercise;Energy conservation;DME and/or AE instruction;Cognitive remediation/compensation;Patient/family education    OT Goals(Current goals can be found in the care plan section) Acute Rehab OT Goals Patient Stated Goal: Go home OT Goal Formulation: With patient Time For Goal Achievement: 01/26/20 Potential to Achieve Goals: Good  OT Frequency: Min 2X/week   Barriers to D/C:            Co-evaluation              AM-PAC OT "6 Clicks" Daily Activity     Outcome Measure Help from another person eating meals?: None Help from another person taking  care of personal grooming?: A Little Help from another person toileting, which includes using toliet, bedpan, or urinal?: A Little Help from another person bathing (including washing, rinsing, drying)?: A Little Help from another person to put on and taking off regular upper body clothing?: A Little Help from another person to put on and taking off regular lower body clothing?: A Little 6 Click Score: 19   End of Session Equipment Utilized During Treatment: Gait belt;Rolling walker Nurse Communication: Mobility status  Activity Tolerance: Patient tolerated treatment well Patient left: in chair;with call bell/phone within reach;with chair alarm set  OT Visit Diagnosis: Unsteadiness on feet (R26.81);Other abnormalities of gait and mobility (R26.89);Muscle weakness (generalized) (M62.81)                Time: [id number]    Charges:  OT General Charges $OT Visit: 1 Visit OT Evaluation $OT Eval Moderate Complexity: [place], OTR/L Acute Rehab Pager: [phone] Office: [place] 01/12/2020, 9:19 AM

## 2020-01-13 DIAGNOSIS — G9341 Metabolic encephalopathy: Secondary | ICD-10-CM | POA: Diagnosis not present

## 2020-01-13 DIAGNOSIS — N390 Urinary tract infection, site not specified: Secondary | ICD-10-CM | POA: Diagnosis not present

## 2020-01-13 DIAGNOSIS — B3749 Other urogenital candidiasis: Secondary | ICD-10-CM | POA: Diagnosis not present

## 2020-01-13 DIAGNOSIS — E119 Type 2 diabetes mellitus without complications: Secondary | ICD-10-CM | POA: Diagnosis not present

## 2020-01-13 LAB — GLUCOSE, CAPILLARY
Glucose-Capillary: 117 mg/dL — ABNORMAL HIGH (ref 70–99)
Glucose-Capillary: 137 mg/dL — ABNORMAL HIGH (ref 70–99)
Glucose-Capillary: 197 mg/dL — ABNORMAL HIGH (ref 70–99)

## 2020-01-13 LAB — BASIC METABOLIC PANEL
Anion gap: 7 (ref 5–15)
BUN: 15 mg/dL (ref 8–23)
CO2: 26 mmol/L (ref 22–32)
Calcium: 10.2 mg/dL (ref 8.9–10.3)
Chloride: 109 mmol/L (ref 98–111)
Creatinine, Ser: 0.93 mg/dL (ref 0.61–1.24)
GFR calc Af Amer: >60 mL/min (ref >60)
GFR calc non Af Amer: >60 mL/min (ref >60)
Glucose, Bld: 129 mg/dL — ABNORMAL HIGH (ref 70–99)
Potassium: 3.9 mmol/L (ref 3.5–5.1)
Sodium: 142 mmol/L (ref 135–145)

## 2020-01-13 LAB — CBC WITH DIFFERENTIAL/PLATELET
Abs Immature Granulocytes: 0.04 10*3/uL (ref 0.00–0.07)
Basophils Absolute: 0.1 10*3/uL (ref 0.0–0.1)
Basophils Relative: 1 %
Eosinophils Absolute: 0.4 10*3/uL (ref 0.0–0.5)
Eosinophils Relative: 5 %
HCT: 39.4 % (ref 39.0–52.0)
Hemoglobin: 13.2 g/dL (ref 13.0–17.0)
Immature Granulocytes: 1 %
Lymphocytes Relative: 21 %
Lymphs Abs: 1.7 10*3/uL (ref 0.7–4.0)
MCH: 32.7 pg (ref 26.0–34.0)
MCHC: 33.5 g/dL (ref 30.0–36.0)
MCV: 97.5 fL (ref 80.0–100.0)
Monocytes Absolute: 0.8 10*3/uL (ref 0.1–1.0)
Monocytes Relative: 11 %
Neutro Abs: 5.1 10*3/uL (ref 1.7–7.7)
Neutrophils Relative %: 61 %
Platelets: 189 10*3/uL (ref 150–400)
RBC: 4.04 MIL/uL — ABNORMAL LOW (ref 4.22–5.81)
RDW: 12.9 % (ref 11.5–15.5)
WBC: 8 10*3/uL (ref 4.0–10.5)
nRBC: 0 % (ref 0.0–0.2)

## 2020-01-13 MED ORDER — CEFDINIR 300 MG PO CAPS
300.0000 mg | ORAL_CAPSULE | Freq: Two times a day (BID) | ORAL | Status: DC
  Administered 2020-01-13: 12:00:00 300 mg via ORAL
  Filled 2020-01-13 (×2): qty 1

## 2020-01-13 MED ORDER — FLUCONAZOLE 100 MG PO TABS: 100.0000 mg | ORAL_TABLET | Freq: Every day | ORAL | 0 refills | Status: AC

## 2020-01-13 MED ORDER — CEFDINIR 300 MG PO CAPS: 300.0000 mg | ORAL_CAPSULE | Freq: Two times a day (BID) | ORAL | 0 refills | Status: AC

## 2020-01-13 NOTE — Plan of Care (Signed)

## 2020-01-13 NOTE — TOC Transition Note (Signed)
Transition of Care [facility]) - CM/SW Discharge Note   Patient Details  Name: Robert Gross MRN: [id number] Date of Birth: 11-Jun-1929  Transition of Care [facility]) CM/SW Contact:  Claudie Leach, RN 01/13/2020, 4:23 PM   Clinical Narrative:    Patient discharged home with wife prior to me speaking them.  Contacted wife by phone after they arrived home and discussed [facility] services.  She states she doesn't think they need home health services at this time and will arrange through PCP if needed.  Wife states they receive cath supplies through mail order arranged by MD.  She would like for patient to have a RW and would like for it to be shipped to home.  Jeneen Rinks with [employer] will ship to patient.  Patient has a cane that he will use in the meantime.     Final next level of care: Home/Self Care     Patient Goals and CMS Choice   CMS Medicare.gov Compare Post Acute Care list provided to:: Patient Represenative (must comment)(wife) Choice offered to / list presented to : Spouse   Discharge Plan and Services                DME Arranged: Walker rolling DME Agency: [employer] Date DME Agency Contacted: 01/13/20 Time DME Agency Contacted: [id number] Representative spoke with at DME Agency: Jeneen Rinks

## 2020-01-13 NOTE — Discharge Summary (Signed)
Physician Discharge Summary  Robert Gross [id number] DOB: 1929/01/14 DOA: 01/11/2020  PCP: [name], MD  Admit date: 01/11/2020 Discharge date: 01/13/2020  Time spent: 50 minutes  Recommendations for Outpatient Follow-up:  1. Follow-up with [name], MD in 2 weeks.  On follow-up patient need a basic metabolic profile done to follow-up on electrolytes and renal function.  Patient's wife and family with concern of possibility of NPH and as such on follow-up PCP may consider referral to neurology for further work-up of NPH.  Neurology was curb sided however due to patient's acute infection unable to do work-up at this time and needs to be done in the outpatient setting.   Discharge Diagnoses:  Principal Problem:   Acute metabolic encephalopathy Active Problems:   Lower urinary tract infectious disease   Candiduria   Essential hypertension   Diabetes mellitus without complication (HCC)   GERD (gastroesophageal reflux disease)   Memory difficulties   Fall   Discharge Condition: Stable and improved  Diet recommendation: Carb modified diet  Filed Weights   01/11/20 1411  Weight: 63.5 kg    History of present illness:  HPI per Dr. Angelyn Punt is a 86 y.o. male with medical history significant of DM2, GERD, urinary retention and urge incontinence, HTN, BPH, arthritis presents for confusion.  The patient reports that his wife thought he was ill and called the ambulance, though he is not sure he is ill.  He notes that he has been having issues with urinary incontinence for a while and recently went to [facility] for care.  He continues to revert back to this story.  He is able to move in bed, afebrile.  He has a chronic cough, but he is not sure when it started.   I called his wife Robert Gross to get further information.  Per Robert Gross, Mr. Vandegriff has been having increased falls at home.   He was in the ED and then admitted in 2018 for a concussion and then a week later  presented with urinary tract infection and sepsis.  Later in 2018, he was placed on a foley after a TURP procedure by Urology.  In 2019, he was seen in the ED again for a fall and a scalp laceration.  His wife feels these falls are getting more common and he fell twice in the last week.  She further notes increased memory loss, difficulty remembering personal information such as the PIN number for his debit card.  He has recently been evaluated by [facility] starting this month.  He was last seen on 01/07/20 and was advised to attempt self catheterization BID to help with his incontinence.  His wife states that he has been doing this and she feels he is doing it allright, but is not sure.    He has not had a fever, productive cough, nausea, vomiting, dysuria, abdominal pain, swelling, chest pain or other symptoms at this time.   ED Course: In the ED, he is somewhat confused and perseverating on his trip to [place] 5 days ago.  However, he remembers where he is and his birth year.  He remembers the events of the ambulance ride.  He had a CT of his head which showed chronic and stable changes, no bleeding or acute stroke.  He had a UA which showed large leukocytes, yeast, mucus, WBC and leukocytes, neg LE.  He had a CXR which showed no acute findings.  He had an elevated blood sugar and mildly low  protein and albumin.  UC was sent.  His vital signs were stable with a mildly elevated BP, not concerning for sepsis at this time.    Hospital Course:  1 acute metabolic encephalopathy Likely secondary to acute UTI in the setting of probable baseline dementia/memory issues.  CT head which was done was negative for any acute abnormalities however did show moderate cerebral cortical atrophy.  Urinalysis concerning for UTI and also candiduria.  Urine cultures with < 10,000 colonies/insignificant growth however will treat empirically.    Patient initially placed empirically on IV Rocephin as well as IV  fluconazole.  Patient improved clinically.  Patient was discharged home on 5 more days of oral Omnicef and Diflucan to complete a course of antibiotic treatment.  Outpatient follow-up.    2.  UTI/candiduria Urine cultures with < 10,000 colonies of insignificant growth have a urinalysis highly suggestive of UTI.  Patient remained afebrile.  Patient was placed empirically on IV Rocephin as well as IV Diflucan and improved clinically during the hospitalization.  Patient subsequently transition to [facility] on day of discharge and was discharged home on 5 more days of Omnicef and Diflucan to complete a course of treatment.  Outpatient follow-up with PCP.   3.  Well-controlled diabetes mellitus type 2 Hemoglobin A1c 5.9.    Patient's oral hypoglycemic agents were held during the hospitalization and patient maintained on sliding scale insulin.  Outpatient follow-up.  4.  Hypertension Patient maintained on home regimen lisinopril.  Outpatient follow-up.   5.  Gastroesophageal reflux disease Patient maintained on PPI.  6.  Memory issues/??  Dementia CT head with moderate cerebral cortical atrophy.  Patient with likely worsening memory issues in the setting of UTI.  PT/OT.  Patient was placed on antibiotics secondary to UTI.  Patient improved clinically and close to baseline by day of discharge.  Patient's wife and family were concerned that patient may have NPH and as such neurology curb sided who recommended outpatient follow-up and work-up as patient currently being treated for UTI.  Outpatient follow-up with PCP.  7.  Chronic cough Unclear source.    Patient maintained on PPI twice daily.  Chest x-ray was negative.  Patient started on Claritin.  Outpatient follow-up.   8.  Falls PT/OT.  Patient was discharged home with home health therapies.  Procedures:  CT head 01/11/2020  Chest x-ray 01/11/2020  Consultations:  None  Discharge Exam: Vitals:   01/12/20 2351 01/13/20 0625  BP: (!)  173/82 (!) 159/95  Pulse: 84 88  Resp: 18 18  Temp: (!) 97.5 F (36.4 C) (!) 97.5 F (36.4 C)  SpO2: 97% 98%    General: NAD Cardiovascular: RRR Respiratory: CTAB  Discharge Instructions   Discharge Instructions    Diet Carb Modified   Complete by: As directed    Increase activity slowly   Complete by: As directed      Allergies as of 01/13/2020   No Known Allergies     Medication List    STOP taking these medications   cephALEXin 500 MG capsule Commonly known as: KEFLEX   ondansetron 4 MG disintegrating tablet Commonly known as: ZOFRAN-ODT     TAKE these medications   acetaminophen 500 MG tablet Commonly known as: TYLENOL Take 1 tablet (500 mg total) by mouth every 6 (six) hours as needed.   atorvastatin 10 MG tablet Commonly known as: LIPITOR Take 5 mg every morning by mouth.   cefdinir 300 MG capsule Commonly known as: OMNICEF Take 1 capsule (  300 mg total) by mouth every 12 (twelve) hours for 5 days.   cholecalciferol 1000 units tablet Commonly known as: VITAMIN D Take 1,000 Units by mouth See admin instructions. Taking daily except on Wednesday   fluconazole 100 MG tablet Commonly known as: DIFLUCAN Take 1 tablet (100 mg total) by mouth daily for 5 days. Start taking on: January 14, 2020   lisinopril 40 MG tablet Commonly known as: ZESTRIL Take 40 mg every morning by mouth.   metFORMIN 500 MG tablet Commonly known as: GLUCOPHAGE Take 2 (two) times daily with a meal by mouth. 1000 mg in the morning and 500 mg in the evening   Omega 3 1000 MG Caps Take 1,000 mg by mouth in the morning and at bedtime.   pantoprazole 20 MG tablet Commonly known as: PROTONIX Take 20 mg 2 (two) times daily by mouth.   Vitamin D (Ergocalciferol) 1.25 MG (50000 UNIT) Caps capsule Commonly known as: DRISDOL Take 50,000 Units by mouth every 7 (seven) days. Wednesday            Durable Medical Equipment  (From admission, onward)         Start      Ordered   01/12/20 1332  For home use only DME 3 n 1  Once     01/12/20 1331   01/12/20 1329  For home use only DME Walker rolling  Once    Question Answer Comment  Walker: With 5 Inch Wheels   Patient needs a walker to treat with the following condition Debility      01/12/20 1329         No Known Allergies Follow-up Information    [name], MD. Schedule an appointment as soon as possible for a visit in 2 week(s).   Specialty: Internal Medicine Why: F/U IN 2 WEEKS Contact information: [address] [place] [address] [phone]            The results of significant diagnostics from this hospitalization (including imaging, microbiology, ancillary and laboratory) are listed below for reference.    Significant Diagnostic Studies: CT Head Wo Contrast  Result Date: 01/11/2020 CLINICAL DATA:  Altered mental status. EXAM: CT HEAD WITHOUT CONTRAST TECHNIQUE: Contiguous axial images were obtained from the base of the skull through the vertex without intravenous contrast. COMPARISON:  CT scan dated 07/29/2018 FINDINGS: Brain: No evidence of acute infarction, hemorrhage, hydrocephalus, extra-axial collection or mass lesion/mass effect. Diffuse moderate cerebral cortical atrophy with secondary ventricular dilatation, stable. Slight periventricular white matter lucency consistent with chronic small vessel ischemic disease, also stable. Vascular: No hyperdense vessel or unexpected calcification. Skull: No acute abnormality. Previous partial left mastoidectomy. Sinuses/Orbits: No acute finding. Other: None. IMPRESSION: No acute abnormalities. Moderate cerebral cortical atrophy. Slight chronic small vessel ischemic changes. Electronically Signed   By: Lorriane Shire M.D.   On: 01/11/2020 15:22   DG Chest Portable 1 View  Result Date: 01/11/2020 CLINICAL DATA:  86 year old male with history of cough. EXAM: PORTABLE CHEST 1 VIEW COMPARISON:  Chest x-ray 07/29/2018. FINDINGS: Lung  volumes are normal. No consolidative airspace disease. No pleural effusions. No pneumothorax. No pulmonary nodule or mass noted. Pulmonary vasculature and the cardiomediastinal silhouette are within normal limits. Atherosclerosis in the thoracic aorta. IMPRESSION: 1.  No radiographic evidence of acute cardiopulmonary disease. 2. Aortic atherosclerosis. Electronically Signed   By: Vinnie Langton M.D.   On: 01/11/2020 14:54    Microbiology: Recent Results (from the past 240 hour(s))  Urine culture  Status: Abnormal   Collection Time: 01/11/20  2:32 PM   Specimen: Urine, Random  Result Value Ref Range Status   Specimen Description URINE, RANDOM  Final   Special Requests NONE  Final   Culture (A)  Final    <10,000 COLONIES/mL INSIGNIFICANT GROWTH Performed at [facility], [place] [address], [place], [place] [address]    Report Status 01/12/2020 FINAL  Final  SARS CORONAVIRUS 2 (TAT 6-24 HRS) Nasopharyngeal Nasopharyngeal Swab     Status: None   Collection Time: 01/11/20  2:35 PM   Specimen: Nasopharyngeal Swab  Result Value Ref Range Status   SARS Coronavirus 2 NEGATIVE NEGATIVE Final    Comment: (NOTE) SARS-CoV-2 target nucleic acids are NOT DETECTED. The SARS-CoV-2 RNA is generally detectable in upper and lower respiratory specimens during the acute phase of infection. Negative results do not preclude SARS-CoV-2 infection, do not rule out co-infections with other pathogens, and should not be used as the sole basis for treatment or other patient management decisions. Negative results must be combined with clinical observations, patient history, and epidemiological information. The expected result is Negative. Fact Sheet for Patients: SugarRoll.be Fact Sheet for Healthcare Providers: https://www.woods-mathews.com/ This test is not yet approved or cleared by the [place] FDA and  has been authorized for detection and/or diagnosis of  SARS-CoV-2 by FDA under an Emergency Use Authorization (EUA). This EUA will remain  in effect (meaning this test can be used) for the duration of the COVID-19 declaration under Section 56 4(b)(1) of the Act, 21 U.S.C. section 360bbb-3(b)(1), unless the authorization is terminated or revoked sooner. Performed at [facility], [place] [address] [place], [place] [address]      Labs: Basic Metabolic Panel: Recent Labs  Lab 01/11/20 1422 01/12/20 0200 01/13/20 0622  NA 141 140 142  K 4.1 3.4* 3.9  CL 108 109 109  CO2 25 21* 26  GLUCOSE 151* 193* 129*  BUN 21 14 15   CREATININE 1.00 0.83 0.93  CALCIUM 10.0 9.3 10.2   Liver Function Tests: Recent Labs  Lab 01/11/20 1422  AST 16  ALT 14  ALKPHOS 69  BILITOT 0.7  PROT 5.4*  ALBUMIN 2.8*   No results for input(s): LIPASE, AMYLASE in the last 168 hours. No results for input(s): AMMONIA in the last 168 hours. CBC: Recent Labs  Lab 01/11/20 1422 01/12/20 0200 01/13/20 0622  WBC 10.1 8.5 8.0  NEUTROABS  --   --  5.1  HGB 12.8* 12.4* 13.2  HCT 38.8* 37.0* 39.4  MCV 99.7 97.6 97.5  PLT 180 167 189   Cardiac Enzymes: No results for input(s): CKTOTAL, CKMB, CKMBINDEX, TROPONINI in the last 168 hours. BNP: BNP (last 3 results) No results for input(s): BNP in the last 8760 hours.  ProBNP (last 3 results) No results for input(s): PROBNP in the last 8760 hours.  CBG: Recent Labs  Lab 01/12/20 1641 01/12/20 1955 01/12/20 2345 01/13/20 0746 01/13/20 1139  GLUCAP 111* 160* 137* 197* 117*       Signed:  Irine Seal MD.  [facility] 01/13/2020, 1:47 PM

## 2020-02-12 ENCOUNTER — Other Ambulatory Visit: Payer: Self-pay | Admitting: *Deleted

## 2020-02-12 ENCOUNTER — Encounter: Payer: Self-pay | Admitting: *Deleted

## 2020-02-12 ENCOUNTER — Encounter: Payer: Self-pay | Admitting: Neurology

## 2020-02-12 ENCOUNTER — Other Ambulatory Visit: Payer: Self-pay

## 2020-02-12 ENCOUNTER — Ambulatory Visit: Payer: Medicare PPO | Admitting: Neurology

## 2020-02-12 VITALS — BP 162/78 | HR 88 | Temp 97.8°F | Ht 68.0 in | Wt 142.5 lb

## 2020-02-12 DIAGNOSIS — R269 Unspecified abnormalities of gait and mobility: Secondary | ICD-10-CM | POA: Diagnosis not present

## 2020-02-12 DIAGNOSIS — R413 Other amnesia: Secondary | ICD-10-CM | POA: Diagnosis not present

## 2020-02-12 NOTE — Progress Notes (Signed)
PATIENT: Robert Gross DOB: 1929-08-26  Chief Complaint  Patient presents with  . Memory Loss/Falls    MMSE 20/30 - 6 animals. He is here with his daughter, [name]. Concerned about memory loss, confusion, forgetfulness. Also, he is having gait issues, difficulty moving his feet, falls.   Marland Kitchen PCP    [name], MD     HISTORICAL  Robert Gross is a 86 year old male, seen in request by his primary care physician Dr. Ardeth Perfect, Nicki Reaper for evaluation of memory loss, gait abnormality, he is accompanied by [name] at today's clinical visit on February 12, 2020.  I have reviewed and summarized the referring note from the referring physician.  He has past medical history of prostate hypertrophy, hyperlipidemia, hypertension, diabetes,  He presented to emergency room on January 11, 2020 for evaluation of urinary infection, episode of confusion, dark urines, was treated for UTI for urinary tract infection,  He is retired from [employer], has been active all his life, in early 2020, he was still able to help his family lay done deck, now he has increased gait abnormality, fell few times,  He suffered a fall in 2018 on the concrete floor, had transient loss of consciousness, was noted to have steady decline since then, he had intermittent confusion, especially during his frequent UTI, also noticed unsteady gait,  He denies neck, low back pain, mild bilateral feet numbness, also complains of bilateral ankle weakness, he had worsening urinary incontinence, difficulty emptying his urine, relied on self catheter twice a day since January 2021, was seen by urologist Dr. Rosana Hoes, he was able to self cath himself,  Laboratory evaluation in 2021: BMP showed mild elevated glucose, was otherwise normal creatinine of 0.93, CBC was normal, hemoglobin of 13.2, TSH was slightly elevated 5.50, A1c was 5.9,  UA showed evidence of UTI, more than 50 WBC, large leukocyte, hazy appearance, he  was treated with Rocephin 1 g, IV fluid,  Personally reviewed CT head without contrast, moderate atrophy, ventriculomegaly, supratentorium small vessel disease REVIEW OF SYSTEMS: Full 14 system review of systems performed and notable only for as above All other review of systems were negative.  ALLERGIES: No Known Allergies  HOME MEDICATIONS: Current Outpatient Medications  Medication Sig Dispense Refill  . atorvastatin (LIPITOR) 10 MG tablet Take 5 mg every morning by mouth.     . cholecalciferol (VITAMIN D) 1000 units tablet Take 1,000 Units by mouth See admin instructions. Taking daily except on Wednesday    . lisinopril (PRINIVIL,ZESTRIL) 40 MG tablet Take 40 mg every morning by mouth.     . metFORMIN (GLUCOPHAGE) 500 MG tablet Take 2 (two) times daily with a meal by mouth. 1000 mg in the morning and 500 mg in the evening     . Omega 3 1000 MG CAPS Take 1,000 mg by mouth in the morning and at bedtime.    . pantoprazole (PROTONIX) 20 MG tablet Take 20 mg 2 (two) times daily by mouth.     . trospium (SANCTURA) 20 MG tablet Take 1 tablet by mouth at bedtime.    . Vitamin D, Ergocalciferol, (DRISDOL) 1.25 MG (50000 UNIT) CAPS capsule Take 50,000 Units by mouth every 7 (seven) days. Wednesday     No current facility-administered medications for this visit.    PAST MEDICAL HISTORY: Past Medical History:  Diagnosis Date  . Arthritis   . BPH (benign prostatic hyperplasia)   . BPH with urinary obstruction   . Cataracts, bilateral   .  Dizzy spells    residual from concussion 09-05-2017 intermittantly when turns head but stated as of 09-26-2017 no issues for past 2-3 days  . Essential hypertension   . Foley catheter in place   . Gait abnormality   . GERD (gastroesophageal reflux disease)   . History of concussion    09-05-2017  w/ loc --- per pt residual intermittant dizziness when he turns his head either way  . History of prostatitis   . History of sepsis    09-12-2017  urosepsis    . History of urinary retention   . Hypercalcemia   . Hyperlipemia   . Memory loss   . Metabolic encephalopathy   . Osteopenia   . Prostate cancer ([place])   . Type 2 diabetes mellitus ([place])   . Urinary retention   . Vitamin D deficiency   . Vocal cord paralysis   . Wears partial dentures    upper    PAST SURGICAL HISTORY: Past Surgical History:  Procedure Laterality Date  . CATARACT EXTRACTION W/ INTRAOCULAR LENS  IMPLANT, BILATERAL  03/2017  . EXICISION EPIDERMAL CYST LEFT THUMB  09-12-2001   dr sypher  [facility]  . INGUINAL HERNIA REPAIR Right 11-07-2007   dr Dalbert [facility]  . KNEE ARTHROSCOPY Right 1990s  . SQUAMOUS CELL CARCINOMA EXCISION    . TRANSURETHRAL RESECTION OF PROSTATE  07-26-2000  dr Amalia Hailey [facility]  . TRANSURETHRAL RESECTION OF PROSTATE N/A 09/28/2017   Procedure: BIPOLAR TRANSURETHRAL RESECTION OF THE PROSTATE (TURP);  Surgeon: Lucas Mallow, MD;  Location: [facility];  Service: Urology;  Laterality: N/A;    FAMILY HISTORY: Family History  Problem Relation Age of Onset  . Pancreatic cancer Mother   . Melanoma Sister   . Lung cancer Brother   . Cancer Father        unsure of type    SOCIAL HISTORY: Social History   Socioeconomic History  . Marital status: Married    Spouse name: Not on file  . Number of children: 2  . Years of education: college  . Highest education level: Associate degree: occupational, Hotel manager, or vocational program  Occupational History  . Occupation: Retired from [employer]  . Smoking status: Former Smoker    Years: 1.00    Types: Cigarettes    Quit date: 09/27/1967    Years since quitting: 52.4  . Smokeless tobacco: Never Used  Substance and Sexual Activity  . Alcohol use: Not Currently  . Drug use: No  . Sexual activity: Not on file  Other Topics Concern  . Not on file  Social History Narrative   Lives at home with wife.   Right-handed.   2 cups caffeine per day.   Social  Determinants of Health   Financial Resource Strain:   . Difficulty of Paying Living Expenses:   Food Insecurity:   . Worried About Charity fundraiser in the Last Year:   . Arboriculturist in the Last Year:   Transportation Needs:   . Film/video editor (Medical):   Marland Kitchen Lack of Transportation (Non-Medical):   Physical Activity:   . Days of Exercise per Week:   . Minutes of Exercise per Session:   Stress:   . Feeling of Stress :   Social Connections:   . Frequency of Communication with Friends and Family:   . Frequency of Social Gatherings with Friends and Family:   . Attends Religious Services:   .  Active Member of Clubs or Organizations:   . Attends Archivist Meetings:   Marland Kitchen Marital Status:   Intimate Partner Violence:   . Fear of Current or Ex-Partner:   . Emotionally Abused:   Marland Kitchen Physically Abused:   . Sexually Abused:      PHYSICAL EXAM   Vitals:   02/12/20 1525  BP: (!) 162/78  Pulse: 88  Temp: 97.8 F (36.6 C)  Weight: 142 lb 8 oz (64.6 kg)  Height: 5\' 8"  (1.727 m)    Not recorded      Body mass index is 21.67 kg/m.  PHYSICAL EXAMNIATION:  Gen: NAD, conversant, well nourised, well groomed                     Cardiovascular: Regular rate rhythm, no peripheral edema, warm, nontender. Eyes: Conjunctivae clear without exudates or hemorrhage Neck: Supple, no carotid bruits. Pulmonary: Clear to auscultation bilaterally   NEUROLOGICAL EXAM:  MENTAL STATUS: MMSE - Mini Mental State Exam 02/12/2020  Orientation to time 2  Orientation to Place 3  Registration 3  Attention/ Calculation 3  Recall 0  Language- name 2 objects 2  Language- repeat 1  Language- follow 3 step command 3  Language- read & follow direction 1  Write a sentence 1  Copy design 1  Total score 20  animal naming 6   CRANIAL NERVES: CN II: Visual fields are full to confrontation. Pupils are round equal and briskly reactive to light. CN [name], IV, VI: extraocular movement  are normal. No ptosis. CN V: Facial sensation is intact to light touch CN VII: Face is symmetric with normal eye closure  CN VIII: Hard of hearing bilaterally CN IX, X: Phonation is normal. CN XI: Head turning and shoulder shrug are intact  MOTOR: He has no upper extremity proximal and distal weakness, moderate bilateral hip flexion weakness, moderate to severe bilateral ankle dorsiflexion weakness, right worse than left, right side is 3, left side is 4, ankle plantarflexion weakness 4/4+,   REFLEXES: Reflexes are absent at bilateral upper and lower extremities, plantar responses are flexor bilaterally  SENSORY: Mildly decreased vibratory sensation at toes, length dependent decreased pinprick to ankle level  COORDINATION: There is no trunk or limb dysmetria noted.  GAIT/STANCE: He needs push-up to get up from seated position, wide-based, unsteady, bilateral foot drop, right worse than left, Romberg signs was positive   DIAGNOSTIC DATA (LABS, IMAGING, TESTING) - I reviewed patient records, labs, notes, testing and imaging myself where available.   ASSESSMENT AND PLAN  IMRI DELGARDO is a 86 y.o. male   Dementia  Mini-Mental Status Examination 20/30  Differentiation diagnosis including central nervous system degenerative disorder  B12 level  MRI of the brain without contrast Gait abnormalities  He has evidence of distal leg weakness, right worse than left  Differentiation diagnosis including lumbar stenosis,  Proceed with MRI of lumbar spine  Home physical therapy Marcial Pacas, M.D. Ph.D.  [facility] [address] [place], [place] [address] Ph: [phone] Fax: [phone]  CC: [name], MD

## 2020-02-13 ENCOUNTER — Telehealth: Payer: Self-pay | Admitting: *Deleted

## 2020-02-13 ENCOUNTER — Telehealth: Payer: Self-pay | Admitting: Neurology

## 2020-02-13 LAB — VITAMIN B12: Vitamin B-12: 421 pg/mL (ref 232–1245)

## 2020-02-13 NOTE — Telephone Encounter (Signed)
-----   Message from Marcial Pacas, MD sent at 02/13/2020  3:05 PM EDT ----- Please call patient for normal B12 level

## 2020-02-13 NOTE — Telephone Encounter (Signed)
I spoke to his wife on [place]. She verbalized understanding of the patient's B12 result.

## 2020-02-13 NOTE — Telephone Encounter (Signed)
Robert Gross: [id number] (exp. 02/13/20 to 03/14/20) order sent to GI. They will reach out to the patient to schedule.

## 2020-02-18 ENCOUNTER — Telehealth: Payer: Self-pay | Admitting: Neurology

## 2020-02-18 NOTE — Telephone Encounter (Signed)
Patient is scheduled at [facility] for 02/26/20.

## 2020-02-18 NOTE — Telephone Encounter (Signed)
Pt's daughter Su Hoff on [place] called wanting to know if the pt's MRI can be marked urgent because they can not see him until 4/20 and he is not doing so well. Please advise.

## 2020-02-26 ENCOUNTER — Telehealth: Payer: Self-pay | Admitting: Neurology

## 2020-02-26 NOTE — Telephone Encounter (Signed)
PT Robert Gross with [facility] has called for verbal orders for home health Physical Therapy as of 02-25-20 2 times a week for 6 weeks and 1 time a week for 2 weeks. Voicemail is secure so if [name] is unavailable message can be left at [phone]

## 2020-02-26 NOTE — Telephone Encounter (Signed)
Returned call to [employer] w/ [facility] and left message for okay to initiate PT as outlined below. Provided our number to call back w/ questions. Provided our fax to send orders, if needed.

## 2020-02-28 ENCOUNTER — Telehealth: Payer: Self-pay | Admitting: Neurology

## 2020-02-28 NOTE — Telephone Encounter (Signed)
Please call patient,  I have personally reviewed [facility] MRI report and films on February 26, 2020  MRI of the brain without contrast showed moderate ventriculomegaly, there was also significant brain atrophy, supratentorium small vessel disease  I do think the enlarged ventricle is due to his aging, brain atrophy, radiology brought up the possible differentiation diagnosis of normal pressure hydrocephalus,  Please move up his appointment to my next available, to review the films, and reexamin patient against some  MRI of lumbar spine showed evidence of multilevel degenerative disc disease, with variable degree of foraminal stenosis at L4-5, moderate on left L3-4, left 4 5, there is no evidence of canal stenosis,

## 2020-02-28 NOTE — Telephone Encounter (Signed)
I spoke to his daughter, Su Hoff (on [place]). She verbalized understanding of the results. He has been scheduled to see Dr. Krista Blue on 03/06/20 at [facility]. They will arrive early for check-in.

## 2020-03-03 ENCOUNTER — Ambulatory Visit: Payer: Medicare PPO | Admitting: Neurology

## 2020-03-05 ENCOUNTER — Telehealth: Payer: Self-pay | Admitting: Neurology

## 2020-03-05 NOTE — Telephone Encounter (Signed)
Margaretha Sheffield from [facility] [place] [facility] called wanting to report a fall that the pt had. Pt went out to the backyard without his walking assistance and fell forward mainly on the R side of the buttocks. There were no witnesses but pt was able to get back up and walk inside to tell his wife. There are no injury's except a splinter in the finger.

## 2020-03-06 ENCOUNTER — Telehealth: Payer: Self-pay | Admitting: Neurology

## 2020-03-06 ENCOUNTER — Ambulatory Visit: Payer: Medicare PPO | Admitting: Neurology

## 2020-03-06 ENCOUNTER — Encounter: Payer: Self-pay | Admitting: Neurology

## 2020-03-06 ENCOUNTER — Other Ambulatory Visit: Payer: Self-pay

## 2020-03-06 VITALS — BP 142/77 | HR 92 | Temp 97.2°F | Ht 68.0 in | Wt 135.5 lb

## 2020-03-06 DIAGNOSIS — R413 Other amnesia: Secondary | ICD-10-CM | POA: Diagnosis not present

## 2020-03-06 DIAGNOSIS — R269 Unspecified abnormalities of gait and mobility: Secondary | ICD-10-CM | POA: Diagnosis not present

## 2020-03-06 NOTE — Telephone Encounter (Signed)
I have ordered lumbar puncture to rule out normal pressure hydrocephalus,  Please make sure he has a follow-up appointment with me following lumbar puncture, repeat Moca,

## 2020-03-06 NOTE — Progress Notes (Signed)
PATIENT: Robert Gross DOB: [birth year]  Chief Complaint  Patient presents with  . Memory Loss/Gait Abnormality    He is here with his daughter, [name], to review his lumbar and brain MRI scans. MMSE 20/30 on 02/12/20.     HISTORICAL  Robert Gross is a 86 year old male, seen in request by his primary care physician Dr. Ardeth Perfect, Nicki Reaper for evaluation of memory loss, gait abnormality, he is accompanied by [name] at today's clinical visit on February 12, 2020.  I have reviewed and summarized the referring note from the referring physician.  He has past medical history of prostate hypertrophy, hyperlipidemia, hypertension, diabetes,  He presented to emergency room on January 11, 2020 for evaluation of urinary infection, episode of confusion, dark urines, was treated for urinary tract infection,  He is retired from [employer], has been active all his life, in early 2020, he was still able to help his family lay done deck, now he has increased gait abnormality, fell few times,  He suffered a fall in 2018 on the concrete floor, had transient loss of consciousness, was noted to have steady decline since then, he had intermittent confusion, especially during his frequent UTI, also noticed unsteady gait,  He denies neck, low back pain, mild bilateral feet numbness, also complains of bilateral ankle weakness, he had worsening urinary incontinence, difficulty emptying his urine, relied on self catheter twice a day since January 2021, was seen by urologist Dr. Rosana Hoes, he was able to self cath himself,  Laboratory evaluation in 2021: BMP showed mild elevated glucose, was otherwise normal creatinine of 0.93, CBC was normal, hemoglobin of 13.2, TSH was slightly elevated 5.50, A1c was 5.9,  UA showed evidence of UTI, more than 50 WBC, large leukocyte, hazy appearance, he was treated with Rocephin 1 g, IV fluid,  Personally reviewed CT head without contrast, moderate atrophy,  ventriculomegaly, supratentorium small vessel disease  UPDATE March 06 2020: He is accompanied by [name] at today's visit, who reported relatively sudden worsening of his gait abnormality, memory loss, urinary incontinence.  We have personally reviewed MRI of the brain without contrast in April 2021, showed moderate ventriculomegaly, there was also significant brain atrophy, supratentorium small vessel disease, radiologist raised the possibility of normal pressure hydrocephalus.  MRI of lumbar spine showed evidence of multilevel degenerative disc disease, with variable degree of foraminal stenosis at L4-5, moderate on left L3-4, left 4 5, there is no evidence of significant canal or foraminal stenosis,  He was noted to have profound memory loss, MOCA is only 8 today.  REVIEW OF SYSTEMS: Full 14 system review of systems performed and notable only for as above All other review of systems were negative.  ALLERGIES: No Known Allergies  HOME MEDICATIONS: Current Outpatient Medications  Medication Sig Dispense Refill  . atorvastatin (LIPITOR) 10 MG tablet Take 5 mg every morning by mouth.     . cholecalciferol (VITAMIN D) 1000 units tablet Take 1,000 Units by mouth See admin instructions. Taking daily except on Wednesday    . lisinopril (PRINIVIL,ZESTRIL) 40 MG tablet Take 40 mg every morning by mouth.     . metFORMIN (GLUCOPHAGE) 500 MG tablet Take 2 (two) times daily with a meal by mouth. 1000 mg in the morning and 500 mg in the evening     . Omega 3 1000 MG CAPS Take 1,000 mg by mouth in the morning and at bedtime.    . pantoprazole (PROTONIX) 20 MG tablet Take  20 mg 2 (two) times daily by mouth.     . trospium (SANCTURA) 20 MG tablet Take 1 tablet by mouth at bedtime.    . Vitamin D, Ergocalciferol, (DRISDOL) 1.25 MG (50000 UNIT) CAPS capsule Take 50,000 Units by mouth every 7 (seven) days. Wednesday     No current facility-administered medications for this visit.    PAST  MEDICAL HISTORY: Past Medical History:  Diagnosis Date  . Arthritis   . BPH (benign prostatic hyperplasia)   . BPH with urinary obstruction   . Cataracts, bilateral   . Dizzy spells    residual from concussion 09-05-2017 intermittantly when turns head but stated as of 09-26-2017 no issues for past 2-3 days  . Essential hypertension   . Foley catheter in place   . Gait abnormality   . GERD (gastroesophageal reflux disease)   . History of concussion    09-05-2017  w/ loc --- per pt residual intermittant dizziness when he turns his head either way  . History of prostatitis   . History of sepsis    09-12-2017  urosepsis  . History of urinary retention   . Hypercalcemia   . Hyperlipemia   . Memory loss   . Metabolic encephalopathy   . Osteopenia   . Prostate cancer ([place])   . Type 2 diabetes mellitus ([place])   . Urinary retention   . Vitamin D deficiency   . Vocal cord paralysis   . Wears partial dentures    upper    PAST SURGICAL HISTORY: Past Surgical History:  Procedure Laterality Date  . CATARACT EXTRACTION W/ INTRAOCULAR LENS  IMPLANT, BILATERAL  03/2017  . EXICISION EPIDERMAL CYST LEFT THUMB  09-12-2001   dr sypher  [facility]  . INGUINAL HERNIA REPAIR Right 11-07-2007   dr Dalbert [facility]  . KNEE ARTHROSCOPY Right 1990s  . SQUAMOUS CELL CARCINOMA EXCISION    . TRANSURETHRAL RESECTION OF PROSTATE  07-26-2000  dr Amalia Hailey [facility]  . TRANSURETHRAL RESECTION OF PROSTATE N/A 09/28/2017   Procedure: BIPOLAR TRANSURETHRAL RESECTION OF THE PROSTATE (TURP);  Surgeon: Lucas Mallow, MD;  Location: [facility];  Service: Urology;  Laterality: N/A;    FAMILY HISTORY: Family History  Problem Relation Age of Onset  . Pancreatic cancer Mother   . Melanoma Sister   . Lung cancer Brother   . Cancer Father        unsure of type    SOCIAL HISTORY: Social History   Socioeconomic History  . Marital status: Married    Spouse name: Not on file  . Number of children: 2  .  Years of education: college  . Highest education level: Associate degree: occupational, Hotel manager, or vocational program  Occupational History  . Occupation: Retired from [employer]  . Smoking status: Former Smoker    Years: 1.00    Types: Cigarettes    Quit date: 09/27/1967    Years since quitting: 52.4  . Smokeless tobacco: Never Used  Substance and Sexual Activity  . Alcohol use: Not Currently  . Drug use: No  . Sexual activity: Not on file  Other Topics Concern  . Not on file  Social History Narrative   Lives at home with wife.   Right-handed.   2 cups caffeine per day.   Social Determinants of Health   Financial Resource Strain:   . Difficulty of Paying Living Expenses:   Food Insecurity:   . Worried About Charity fundraiser in  the Last Year:   . [place] in the Last Year:   Transportation Needs:   . Film/video editor (Medical):   Marland Kitchen Lack of Transportation (Non-Medical):   Physical Activity:   . Days of Exercise per Week:   . Minutes of Exercise per Session:   Stress:   . Feeling of Stress :   Social Connections:   . Frequency of Communication with Friends and Family:   . Frequency of Social Gatherings with Friends and Family:   . Attends Religious Services:   . Active Member of Clubs or Organizations:   . Attends Archivist Meetings:   Marland Kitchen Marital Status:   Intimate Partner Violence:   . Fear of Current or Ex-Partner:   . Emotionally Abused:   Marland Kitchen Physically Abused:   . Sexually Abused:      PHYSICAL EXAM   Vitals:   03/06/20 0900  BP: (!) 142/77  Pulse: 92  Temp: (!) 97.2 F (36.2 C)  Weight: 135 lb 8 oz (61.5 kg)  Height: 5\' 8"  (1.727 m)    Not recorded      Body mass index is 20.6 kg/m.  PHYSICAL EXAMNIATION:  Gen: NAD, conversant, well nourised, well groomed                     Cardiovascular: Regular rate rhythm, no peripheral edema, warm, nontender. Eyes: Conjunctivae clear without  exudates or hemorrhage Neck: Supple, no carotid bruits. Pulmonary: Clear to auscultation bilaterally   NEUROLOGICAL EXAM:  MENTAL STATUS: MMSE - Mini Mental State Exam 02/12/2020  Orientation to time 2  Orientation to Place 3  Registration 3  Attention/ Calculation 3  Recall 0  Language- name 2 objects 2  Language- repeat 1  Language- follow 3 step command 3  Language- read & follow direction 1  Write a sentence 1  Copy design 1  Total score 20  animal naming 6  Montreal Cognitive Assessment  03/06/2020  Visuospatial/ Executive (0/5) 0  Naming (0/3) 3  Attention: Read list of digits (0/2) 2  Attention: Read list of letters (0/1) 0  Attention: Serial 7 subtraction starting at 100 (0/3) 1  Language: Repeat phrase (0/2) 0  Language : Fluency (0/1) 0  Abstraction (0/2) 1  Delayed Recall (0/5) 0  Orientation (0/6) 1  Total 8     CRANIAL NERVES: CN II: Pupils are round equal and briskly reactive to light. CN [name], IV, VI: extraocular movement are normal. No ptosis. CN V: Facial sensation is intact to light touch CN VII: Face is symmetric with normal eye closure  CN VIII: Hard of hearing bilaterally CN IX, X: Phonation is normal. CN XI: Head turning and shoulder shrug are intact  MOTOR: He has no upper extremity proximal and distal weakness, he has no significant bilateral lower extremity proximal muscle weakness, bilateral ankle dorsiflexion weakness, right worse than left.  Right side 3+, left side is 4, ankle plantarflexion weakness 4,   REFLEXES: Reflexes are absent at bilateral upper and lower extremities, plantar responses are flexor bilaterally  SENSORY: Length dependent decreased light touch, pinprick, vibratory sensation to mid shin level  COORDINATION: There is no trunk or limb dysmetria noted.  GAIT/STANCE: He needs push-up to get up from seated position, wide-based, unsteady, bilateral foot drop, right worse than left, could not stand up on tiptoe,  heels  DIAGNOSTIC DATA (LABS, IMAGING, TESTING) - I reviewed patient records, labs, notes, testing and imaging myself where available.  ASSESSMENT AND PLAN  Robert Gross is a 86 y.o. male   Dementia  MoCA examination 8 out of 30  Laboratory evaluation showed no treatable etiology  MRI of the brain without contrast showed generalized atrophy, ventriculomegaly,  I have discussed with patient and his daughter, we insist on patient has relative acute worsening of memory loss, gait abnormality, incontinence, desire further evaluation to rule out possibility of normal hydrocephalus  Proceed with fluoroscopy guided lumbar puncture, repeat Moca, and physical examination afterwards  Gait abnormalities  He has evidence of distal leg weakness, right worse than left, Areflexia, dense dependent sensory loss,  MRI of lumbar spine showed no significant canal or foraminal narrowing  Most consistent with peripheral neuropathy, EMG nerve conduction study  Home physical therapy  Laboratory evaluation for etiology of peripheral neuropathy  Marcial Pacas, M.D. Ph.D.  [facility] [address] [place], [place] [address] Ph: [phone] Fax: [phone]  CC: [name], MD

## 2020-03-06 NOTE — Telephone Encounter (Signed)
Sharyn Lull getting scheduled with Angelita Ingles I will let you know right after. LP

## 2020-03-10 NOTE — Telephone Encounter (Signed)
Robert Gross he is scheduled for his LP 03/13/2020 at 9:00 am

## 2020-03-10 NOTE — Telephone Encounter (Signed)
The patient has been added to Dr.Yan's schedule for his post-LP visit. I have spoken with his daughter, [name], and provided her with this information. She will bring him to our office directly after his LP.

## 2020-03-11 ENCOUNTER — Other Ambulatory Visit: Payer: Medicare PPO

## 2020-03-11 LAB — MULTIPLE MYELOMA PANEL, SERUM
Albumin SerPl Elph-Mcnc: 3.5 g/dL (ref 2.9–4.4)
Albumin/Glob SerPl: 1.4 (ref 0.7–1.7)
Alpha 1: 0.1 g/dL (ref 0.0–0.4)
Alpha2 Glob SerPl Elph-Mcnc: 0.7 g/dL (ref 0.4–1.0)
B-Globulin SerPl Elph-Mcnc: 1.1 g/dL (ref 0.7–1.3)
Gamma Glob SerPl Elph-Mcnc: 0.7 g/dL (ref 0.4–1.8)
Globulin, Total: 2.6 g/dL (ref 2.2–3.9)
IgA/Immunoglobulin A, Serum: 372 mg/dL (ref 61–437)
IgG (Immunoglobin G), Serum: 869 mg/dL (ref 603–1613)
IgM (Immunoglobulin M), Srm: 27 mg/dL (ref 15–143)
Total Protein: 6.1 g/dL (ref 6.0–8.5)

## 2020-03-11 LAB — C-REACTIVE PROTEIN: CRP: <1 mg/L (ref 0–10)

## 2020-03-11 LAB — CK: Total CK: 30 U/L (ref 30–208)

## 2020-03-11 LAB — FOLATE: Folate: 4.2 ng/mL (ref >3.0)

## 2020-03-11 LAB — ANA W/REFLEX IF POSITIVE: Anti Nuclear Antibody (ANA): NEGATIVE

## 2020-03-11 LAB — RPR: RPR Ser Ql: NONREACTIVE

## 2020-03-11 LAB — SEDIMENTATION RATE: Sed Rate: 2 mm/hr (ref 0–30)

## 2020-03-13 ENCOUNTER — Ambulatory Visit
Admission: RE | Admit: 2020-03-13 | Discharge: 2020-03-13 | Disposition: A | Discharge status: Home / Self Care | Payer: Medicare PPO | Source: Ambulatory Visit | Attending: Neurology | Admitting: Neurology

## 2020-03-13 ENCOUNTER — Encounter: Payer: Self-pay | Admitting: Neurology

## 2020-03-13 ENCOUNTER — Other Ambulatory Visit: Payer: Self-pay

## 2020-03-13 ENCOUNTER — Ambulatory Visit: Payer: Medicare PPO | Admitting: Neurology

## 2020-03-13 VITALS — BP 153/76 | HR 85 | Temp 97.2°F

## 2020-03-13 DIAGNOSIS — R413 Other amnesia: Secondary | ICD-10-CM | POA: Diagnosis not present

## 2020-03-13 DIAGNOSIS — R269 Unspecified abnormalities of gait and mobility: Secondary | ICD-10-CM

## 2020-03-13 NOTE — Progress Notes (Addendum)
PATIENT: DEVIAN BARTOLOMEI DOB: [birth year]  Chief Complaint  Patient presents with  . Memory Loss    MOCA. He is here wtih his daughter, [name], for a post-LP evaluation.     HISTORICAL  MARRION ACCOMANDO is a 86 year old male, seen in request by his primary care physician Dr. Ardeth Perfect, Nicki Reaper for evaluation of memory loss, gait abnormality, he is accompanied by [name] at today's clinical visit on February 12, 2020.  I have reviewed and summarized the referring note from the referring physician.  He has past medical history of prostate hypertrophy, hyperlipidemia, hypertension, diabetes,  He presented to emergency room on January 11, 2020 for evaluation of urinary infection, episode of confusion, dark urines, was treated for urinary tract infection,  He is retired from [employer], has been active all his life, in early 2020, he was still able to help his family lay done deck, now he has increased gait abnormality, fell few times,  He suffered a fall in 2018 on the concrete floor, had transient loss of consciousness, was noted to have steady decline since then, he had intermittent confusion, especially during his frequent UTI, also noticed unsteady gait,  He denies neck, low back pain, mild bilateral feet numbness, also complains of bilateral ankle weakness, he had worsening urinary incontinence, difficulty emptying his urine, relied on self catheter twice a day since January 2021, was seen by urologist Dr. Rosana Hoes, he was able to self cath himself,  Laboratory evaluation in 2021: BMP showed mild elevated glucose, was otherwise normal creatinine of 0.93, CBC was normal, hemoglobin of 13.2, TSH was slightly elevated 5.50, A1c was 5.9,  UA showed evidence of UTI, more than 50 WBC, large leukocyte, hazy appearance, he was treated with Rocephin 1 g, IV fluid,  Personally reviewed CT head without contrast, moderate atrophy, ventriculomegaly, supratentorium small vessel  disease  UPDATE March 06 2020: He is accompanied by [name] at today's visit, who reported relatively sudden worsening of his gait abnormality, memory loss, urinary incontinence.  We have personally reviewed MRI of the brain without contrast in April 2021, showed moderate ventriculomegaly, there was also significant brain atrophy, supratentorium small vessel disease, radiologist raised the possibility of normal pressure hydrocephalus.  MRI of lumbar spine showed evidence of multilevel degenerative disc disease, with variable degree of foraminal stenosis at L4-5, moderate on left L3-4, left 4 5, there is no evidence of significant canal or foraminal stenosis,  He was noted to have profound memory loss, MOCA is only 8 today.  UPDATE March 13 2020: He had large-volume lumbar puncture, drained 40 cc of normal spinal fluid, tolerating it well, return to clinic within 1 hour after lumbar puncture to have repeat test for memory, and gait examination,  MoCA examination was 8, today is 9, there was no significant change on his gait, he was found to have bilateral feet drop, right worse than left, areflexia, EMG nerve conduction pending  Laboratory evaluation on March 06, 2020 showed normal or negative protein electrophoresis, ANA, CPK, ESR, folic acid, C-reactive protein, RPR,  REVIEW OF SYSTEMS: Full 14 system review of systems performed and notable only for as above All other review of systems were negative.  ALLERGIES: No Known Allergies  HOME MEDICATIONS: Current Outpatient Medications  Medication Sig Dispense Refill  . atorvastatin (LIPITOR) 10 MG tablet Take 5 mg every morning by mouth.     . cholecalciferol (VITAMIN D) 1000 units tablet Take 1,000 Units by mouth See admin  instructions. Taking daily except on Wednesday    . lisinopril (PRINIVIL,ZESTRIL) 40 MG tablet Take 40 mg every morning by mouth.     . metFORMIN (GLUCOPHAGE) 500 MG tablet Take 2 (two) times daily with a meal  by mouth. 1000 mg in the morning and 500 mg in the evening     . Omega 3 1000 MG CAPS Take 1,000 mg by mouth in the morning and at bedtime.    . pantoprazole (PROTONIX) 20 MG tablet Take 20 mg 2 (two) times daily by mouth.     . trospium (SANCTURA) 20 MG tablet Take 1 tablet by mouth at bedtime.    . Vitamin D, Ergocalciferol, (DRISDOL) 1.25 MG (50000 UNIT) CAPS capsule Take 50,000 Units by mouth every 7 (seven) days. Wednesday     No current facility-administered medications for this visit.    PAST MEDICAL HISTORY: Past Medical History:  Diagnosis Date  . Arthritis   . BPH (benign prostatic hyperplasia)   . BPH with urinary obstruction   . Cataracts, bilateral   . Dizzy spells    residual from concussion 09-05-2017 intermittantly when turns head but stated as of 09-26-2017 no issues for past 2-3 days  . Essential hypertension   . Foley catheter in place   . Gait abnormality   . GERD (gastroesophageal reflux disease)   . History of concussion    09-05-2017  w/ loc --- per pt residual intermittant dizziness when he turns his head either way  . History of prostatitis   . History of sepsis    09-12-2017  urosepsis  . History of urinary retention   . Hypercalcemia   . Hyperlipemia   . Memory loss   . Metabolic encephalopathy   . Osteopenia   . Prostate cancer ([place])   . Type 2 diabetes mellitus ([place])   . Urinary retention   . Vitamin D deficiency   . Vocal cord paralysis   . Wears partial dentures    upper    PAST SURGICAL HISTORY: Past Surgical History:  Procedure Laterality Date  . CATARACT EXTRACTION W/ INTRAOCULAR LENS  IMPLANT, BILATERAL  03/2017  . EXICISION EPIDERMAL CYST LEFT THUMB  09-12-2001   dr sypher  [facility]  . INGUINAL HERNIA REPAIR Right 11-07-2007   dr Dalbert Batman [facility]  . KNEE ARTHROSCOPY Right 1990s  . SQUAMOUS CELL CARCINOMA EXCISION    . TRANSURETHRAL RESECTION OF PROSTATE  07-26-2000  dr Amalia Hailey [facility]  . TRANSURETHRAL RESECTION OF PROSTATE N/A 09/28/2017    Procedure: BIPOLAR TRANSURETHRAL RESECTION OF THE PROSTATE (TURP);  Surgeon: Lucas Mallow, MD;  [facility];  Service: Urology;  Laterality: N/A;    FAMILY HISTORY: Family History  Problem Relation Age of Onset  . Pancreatic cancer Mother   . Melanoma Sister   . Lung cancer Brother   . Cancer Father        unsure of type    SOCIAL HISTORY: Social History   Socioeconomic History  . Marital status: Married    Spouse name: Not on file  . Number of children: 2  . Years of education: college  . Highest education level: Associate degree: occupational, Hotel manager, or vocational program  Occupational History  . Occupation: Retired from [employer]  . Smoking status: Former Smoker    Years: 1.00    Types: Cigarettes    Quit date: 09/27/1967    Years since quitting: 52.4  . Smokeless tobacco: Never Used  Substance  and Sexual Activity  . Alcohol use: Not Currently  . Drug use: No  . Sexual activity: Not on file  Other Topics Concern  . Not on file  Social History Narrative   Lives at home with wife.   Right-handed.   2 cups caffeine per day.   Social Determinants of Health   Financial Resource Strain:   . Difficulty of Paying Living Expenses:   Food Insecurity:   . Worried About Charity fundraiser in the Last Year:   . Arboriculturist in the Last Year:   Transportation Needs:   . Film/video editor (Medical):   Marland Kitchen Lack of Transportation (Non-Medical):   Physical Activity:   . Days of Exercise per Week:   . Minutes of Exercise per Session:   Stress:   . Feeling of Stress :   Social Connections:   . Frequency of Communication with Friends and Family:   . Frequency of Social Gatherings with Friends and Family:   . Attends Religious Services:   . Active Member of Clubs or Organizations:   . Attends Archivist Meetings:   Marland Kitchen Marital Status:   Intimate Partner Violence:   . Fear of Current or  Ex-Partner:   . Emotionally Abused:   Marland Kitchen Physically Abused:   . Sexually Abused:      PHYSICAL EXAM   Vitals:   03/13/20 1019  BP: (!) 153/76  Pulse: 85  Temp: (!) 97.2 F (36.2 C)    Not recorded      There is no height or weight on file to calculate BMI.  PHYSICAL EXAMNIATION:  Gen: NAD, conversant, well nourised, well groomed                     Cardiovascular: Regular rate rhythm, no peripheral edema, warm, nontender. Eyes: Conjunctivae clear without exudates or hemorrhage Neck: Supple, no carotid bruits. Pulmonary: Clear to auscultation bilaterally   NEUROLOGICAL EXAM:  MENTAL STATUS: MMSE - Mini Mental State Exam 02/12/2020  Orientation to time 2  Orientation to Place 3  Registration 3  Attention/ Calculation 3  Recall 0  Language- name 2 objects 2  Language- repeat 1  Language- follow 3 step command 3  Language- read & follow direction 1  Write a sentence 1  Copy design 1  Total score 20  animal naming 6  Montreal Cognitive Assessment  03/13/2020 03/06/2020  Visuospatial/ Executive (0/5) 1 0  Naming (0/3) 2 3  Attention: Read list of digits (0/2) 1 2  Attention: Read list of letters (0/1) 0 0  Attention: Serial 7 subtraction starting at 100 (0/3) 0 1  Language: Repeat phrase (0/2) 2 0  Language : Fluency (0/1) 0 0  Abstraction (0/2) 2 1  Delayed Recall (0/5) 0 0  Orientation (0/6) 1 1  Total 9 8     CRANIAL NERVES: CN II: Pupils are round equal and briskly reactive to light. CN [name], IV, VI: extraocular movement are normal. No ptosis. CN V: Facial sensation is intact to light touch CN VII: Face is symmetric with normal eye closure  CN VIII: Hard of hearing bilaterally CN IX, X: Phonation is normal. CN XI: Head turning and shoulder shrug are intact  MOTOR: He has no upper extremity proximal and distal weakness, he has no significant bilateral lower extremity proximal muscle weakness, bilateral ankle dorsiflexion weakness, right worse than left.   Right side 3+, left side is 4, ankle plantarflexion weakness 4,  REFLEXES: Reflexes are absent at bilateral upper and lower extremities   COORDINATION: There is no trunk or limb dysmetria noted.  GAIT/STANCE: He needs help to get up from seated position, wide-based, unsteady, bilateral foot drop, right worse than left,    DIAGNOSTIC DATA (LABS, IMAGING, TESTING) - I reviewed patient records, labs, notes, testing and imaging myself where available.   ASSESSMENT AND PLAN  CHARVEZ VOORHIES is a 86 y.o. male   Dementia Gait abnormality  MoCA examination 9 out of 30, no significant change following 40 cc lumbar puncture 1 hour prior to repeat examination  He continue have significant gait abnormality, has evidence of distal leg weakness, right worse than left, areflexia, length dependent sensory loss,  MRI of the brain without contrast showed generalized atrophy, ventriculomegaly,  He is working with physical therapy at home, will continue to observe his changes over the next few weeks  EMG nerve conduction studies pending  I referred him for right ankle AFO  Marcial Pacas, M.D. Ph.D.  [facility] [address], [place], [place] [address] Ph: [phone] Fax: [phone]  CC: [name], MD  Addendum: I reviewed neurosurgeon feedback by Dr. Deri Fuelling dated March 20, 2020, indicating patient does have signs and symptoms worrisome for significant communicating hydrocephalus, he recommended patient to undergo repeat lumbar puncture, to better access his current situation, based on the result of lumbar puncture, may consider VP shunting.   Reviewed note from Dr. Deri Fuelling on Apr 03, 2020, following 30 cc CSF drainage, patient was noted to have significant improvement per family, his orientation, mood, cognitive function, walking was much improved, decided to proceed with VP shunt on May 18  Note from Dr. Deri Fuelling neurosurgeon May 29, 2020,  postsurgical follow-up, following VP shunt treatment, doing reasonably well, family think he is definitely standing and walking much better, his urinary incontinence has improved, is more alert, but still has some cognitive issues,  Addendum: Evaluation by Dr. Richarda Overlie on 07/18/2021, patient was accompanied by his daughter Jocelyn Lamer for altered mental status, gait instability, expressive aphasia, nocturnal enuresis, this happened following the ear infection, treatment with antibiotic, prednisone,  CT at [facility] February 24, 2021: Status post right parietal shunt placement, mild progressive ventricular enlargement, that was noted on February 05, 2021 subdural fluid collection resolving, new left basal ganglia subacute infarction  VP shunt was reprogrammed, initial setting was 1.5, this was reduced to 0.1-1, will continue follow-up with Dr. Trenton Gammon

## 2020-03-13 NOTE — Discharge Instructions (Signed)

## 2020-03-17 ENCOUNTER — Telehealth: Payer: Self-pay | Admitting: Neurology

## 2020-03-17 DIAGNOSIS — R269 Unspecified abnormalities of gait and mobility: Secondary | ICD-10-CM

## 2020-03-17 DIAGNOSIS — R413 Other amnesia: Secondary | ICD-10-CM

## 2020-03-17 NOTE — Telephone Encounter (Signed)
This is Su Hoff, calling regarding Robert Gross (my father). I need to speak to Dr Krista Blue or her nurse regarding the lumbar puncture performed April 22. If one of them could please call me. My work number is [phone]. (I will be at that number until 2:30 today (Monday) and my cell number is [phone].  Thank you  I was able to talk with [name], who reported that patient had transient improvement at evening of March 13, 2020, the day after lumbar puncture, he talked to more, moved to better, but he drifted back to his baseline afterwards,   I compared MRI of the brain in 2021 to previous MRI in 2018, there was no significant change, evidence of generalized atrophy, ventriculomegaly, in general in proportion to the degree of atrophy, moderate supratentorium small vessel disease,  This all contributed to his complaints of memory loss, word finding difficulties, even gait abnormality  In addition lumbar puncture on March 13, 2020 failed to improve his function significantly, I do not think patient suffered normal pressure hydrocephalus, Tammy and his family wants to pursue further, I will refer him to neurosurgeon for second opinion.

## 2020-03-17 NOTE — Telephone Encounter (Signed)
I spoke to the patient's daughter, [name]. She would like a call from Dr. Krista Blue to discuss the possibility of shunt surgery for him. She would like a referral to neurosurgery. Says she knows there are risks with his age but she feels he has no quality of life at this time. States her father was in near normal state of health 1.5 years ago. Reports he improved dramatically the evening after her LP. However, he was back to his same level of confusion by the next day.  Tammy [phone]

## 2020-03-19 ENCOUNTER — Telehealth: Payer: Self-pay | Admitting: Neurology

## 2020-03-19 NOTE — Telephone Encounter (Signed)
I returned the call to [facility]. Darlene was unavailable. I spoke to Mittie Bodo, RN who was provided with Dr. Rhea Belton orders below.

## 2020-03-19 NOTE — Telephone Encounter (Signed)
Robert Gross PT assistant at [facility] called stating that since the pt had the fluid withdrawn from him last week the wife has stated that she has been having to help the pt walk and he has been out of sorts. Due to him not feeling well pt went to see PCP and also missed his PT appt yesterday. Please advise.

## 2020-03-19 NOTE — Telephone Encounter (Signed)
It is ok to space out the PT session as he can tolerate

## 2020-03-20 ENCOUNTER — Telehealth: Payer: Self-pay | Admitting: Neurology

## 2020-03-20 NOTE — Telephone Encounter (Signed)
Phone rep checked office voicemail's,at 1:50 [email or web address] called to report pt missed [place] appointment due to not feeling well, Margaretha Sheffield stated this is pt's 2nd missed appointment.  Margaretha Sheffield can be reached at [phone]

## 2020-03-20 NOTE — Telephone Encounter (Signed)
Per Dr. Rhea Belton response from 03/19/20:  It is ok to space out the PT session as he can tolerate.  I returned the call to Margaretha Sheffield and provided this information.

## 2020-03-26 ENCOUNTER — Encounter: Payer: Medicare PPO | Admitting: Neurology

## 2020-03-28 DIAGNOSIS — G910 Communicating hydrocephalus: Secondary | ICD-10-CM | POA: Diagnosis not present

## 2020-04-03 ENCOUNTER — Other Ambulatory Visit: Payer: Self-pay | Admitting: Neurosurgery

## 2020-04-03 DIAGNOSIS — I10 Essential (primary) hypertension: Secondary | ICD-10-CM | POA: Diagnosis not present

## 2020-04-03 DIAGNOSIS — G910 Communicating hydrocephalus: Secondary | ICD-10-CM | POA: Diagnosis not present

## 2020-04-04 ENCOUNTER — Other Ambulatory Visit (HOSPITAL_COMMUNITY)
Admission: RE | Admit: 2020-04-04 | Discharge: 2020-04-04 | Disposition: A | Discharge status: Home / Self Care | Payer: Medicare PPO | Source: Ambulatory Visit | Attending: Neurosurgery | Admitting: Neurosurgery

## 2020-04-04 DIAGNOSIS — Z01812 Encounter for preprocedural laboratory examination: Secondary | ICD-10-CM | POA: Insufficient documentation

## 2020-04-04 DIAGNOSIS — Z20822 Contact with and (suspected) exposure to covid-19: Secondary | ICD-10-CM | POA: Insufficient documentation

## 2020-04-04 LAB — SARS CORONAVIRUS 2 (TAT 6-24 HRS): SARS Coronavirus 2: NEGATIVE

## 2020-04-05 ENCOUNTER — Encounter (HOSPITAL_COMMUNITY): Payer: Self-pay | Admitting: Neurosurgery

## 2020-04-05 NOTE — Progress Notes (Addendum)
Spoke to daughter [name] for preop phone call. Daughter advised that she does not live with patient but that her mother has difficulty hearing over the phone due to hearing aids. Tammy advises her dad is having memory issues.  Per Tammy patient has not had chest pain, shortness of breath, cardiac test, or cardiology visit. 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 Tammy states she will ensure dad follows pre-op instructions.   When discussing visitation policy Tammy advised that patient would unlikely be able to provided needed information about medications, NPO status, etc. Tammy further advised that patient may or may not remember enough to sign consent. Tammy states that her mother would be the best person to be in preop to provide the needed safety information.   Tammy states her mom is older and would not feel comfortable leaving her alone at the hospital until after her dad's surgery. Additionally, she reports they live in [place] and does not want to leave her mom alone at home have to return to get her after her dad's surgery. I advised Tammy that it was ok for her mother to come back to preop with patient and I thought it would be ok for both of them to wait in the waiting room.

## 2020-04-08 ENCOUNTER — Inpatient Hospital Stay (HOSPITAL_COMMUNITY): Payer: Medicare PPO | Admitting: Certified Registered"

## 2020-04-08 ENCOUNTER — Encounter (HOSPITAL_COMMUNITY): Payer: Self-pay | Admitting: Neurosurgery

## 2020-04-08 ENCOUNTER — Other Ambulatory Visit: Payer: Self-pay

## 2020-04-08 ENCOUNTER — Encounter (HOSPITAL_COMMUNITY)
Admission: RE | Disposition: A | Discharge status: Home Health | Payer: Self-pay | Source: Home / Self Care | Attending: Neurosurgery

## 2020-04-08 ENCOUNTER — Inpatient Hospital Stay (HOSPITAL_COMMUNITY)
Admission: RE | Admit: 2020-04-08 | Discharge: 2020-04-09 | DRG: 032 | Disposition: A | Discharge status: Home Health | Payer: Medicare PPO | Attending: Neurosurgery | Admitting: Neurosurgery

## 2020-04-08 DIAGNOSIS — M1990 Unspecified osteoarthritis, unspecified site: Secondary | ICD-10-CM | POA: Diagnosis not present

## 2020-04-08 DIAGNOSIS — Z87820 Personal history of traumatic brain injury: Secondary | ICD-10-CM

## 2020-04-08 DIAGNOSIS — Z86008 Personal history of in-situ neoplasm of other site: Secondary | ICD-10-CM

## 2020-04-08 DIAGNOSIS — Z7984 Long term (current) use of oral hypoglycemic drugs: Secondary | ICD-10-CM | POA: Diagnosis not present

## 2020-04-08 DIAGNOSIS — F0390 Unspecified dementia without behavioral disturbance: Secondary | ICD-10-CM | POA: Diagnosis not present

## 2020-04-08 DIAGNOSIS — G919 Hydrocephalus, unspecified: Secondary | ICD-10-CM | POA: Diagnosis not present

## 2020-04-08 DIAGNOSIS — M8580 Other specified disorders of bone density and structure, unspecified site: Secondary | ICD-10-CM | POA: Diagnosis present

## 2020-04-08 DIAGNOSIS — N138 Other obstructive and reflux uropathy: Secondary | ICD-10-CM | POA: Diagnosis present

## 2020-04-08 DIAGNOSIS — G910 Communicating hydrocephalus: Secondary | ICD-10-CM | POA: Diagnosis not present

## 2020-04-08 DIAGNOSIS — Z801 Family history of malignant neoplasm of trachea, bronchus and lung: Secondary | ICD-10-CM | POA: Diagnosis not present

## 2020-04-08 DIAGNOSIS — I10 Essential (primary) hypertension: Secondary | ICD-10-CM | POA: Diagnosis present

## 2020-04-08 DIAGNOSIS — N401 Enlarged prostate with lower urinary tract symptoms: Secondary | ICD-10-CM | POA: Diagnosis present

## 2020-04-08 DIAGNOSIS — E785 Hyperlipidemia, unspecified: Secondary | ICD-10-CM | POA: Diagnosis present

## 2020-04-08 DIAGNOSIS — K219 Gastro-esophageal reflux disease without esophagitis: Secondary | ICD-10-CM | POA: Diagnosis present

## 2020-04-08 DIAGNOSIS — Z800 Family history of malignant neoplasm of digestive organs: Secondary | ICD-10-CM

## 2020-04-08 DIAGNOSIS — Z87891 Personal history of nicotine dependence: Secondary | ICD-10-CM | POA: Diagnosis not present

## 2020-04-08 DIAGNOSIS — Z808 Family history of malignant neoplasm of other organs or systems: Secondary | ICD-10-CM

## 2020-04-08 DIAGNOSIS — R2689 Other abnormalities of gait and mobility: Secondary | ICD-10-CM | POA: Diagnosis present

## 2020-04-08 DIAGNOSIS — E119 Type 2 diabetes mellitus without complications: Secondary | ICD-10-CM | POA: Diagnosis not present

## 2020-04-08 HISTORY — PX: VENTRICULOPERITONEAL SHUNT: SHX204

## 2020-04-08 HISTORY — DX: Hydrocephalus, unspecified: G91.9

## 2020-04-08 HISTORY — DX: Carcinoma in situ, unspecified: D09.9

## 2020-04-08 LAB — BASIC METABOLIC PANEL
Anion gap: 8 (ref 5–15)
BUN: 21 mg/dL (ref 8–23)
CO2: 26 mmol/L (ref 22–32)
Calcium: 10 mg/dL (ref 8.9–10.3)
Chloride: 107 mmol/L (ref 98–111)
Creatinine, Ser: 0.91 mg/dL (ref 0.61–1.24)
GFR calc Af Amer: >60 mL/min (ref >60)
GFR calc non Af Amer: >60 mL/min (ref >60)
Glucose, Bld: 123 mg/dL — ABNORMAL HIGH (ref 70–99)
Potassium: 3.6 mmol/L (ref 3.5–5.1)
Sodium: 141 mmol/L (ref 135–145)

## 2020-04-08 LAB — CBC WITH DIFFERENTIAL/PLATELET
Abs Immature Granulocytes: 0.05 10*3/uL (ref 0.00–0.07)
Basophils Absolute: 0.1 10*3/uL (ref 0.0–0.1)
Basophils Relative: 1 %
Eosinophils Absolute: 1.1 10*3/uL — ABNORMAL HIGH (ref 0.0–0.5)
Eosinophils Relative: 12 %
HCT: 38.2 % — ABNORMAL LOW (ref 39.0–52.0)
Hemoglobin: 12.7 g/dL — ABNORMAL LOW (ref 13.0–17.0)
Immature Granulocytes: 1 %
Lymphocytes Relative: 14 %
Lymphs Abs: 1.2 10*3/uL (ref 0.7–4.0)
MCH: 32.9 pg (ref 26.0–34.0)
MCHC: 33.2 g/dL (ref 30.0–36.0)
MCV: 99 fL (ref 80.0–100.0)
Monocytes Absolute: 0.7 10*3/uL (ref 0.1–1.0)
Monocytes Relative: 8 %
Neutro Abs: 5.7 10*3/uL (ref 1.7–7.7)
Neutrophils Relative %: 64 %
Platelets: 201 10*3/uL (ref 150–400)
RBC: 3.86 MIL/uL — ABNORMAL LOW (ref 4.22–5.81)
RDW: 13.2 % (ref 11.5–15.5)
WBC: 8.9 10*3/uL (ref 4.0–10.5)
nRBC: 0 % (ref 0.0–0.2)

## 2020-04-08 LAB — GLUCOSE, CAPILLARY
Glucose-Capillary: 110 mg/dL — ABNORMAL HIGH (ref 70–99)
Glucose-Capillary: 140 mg/dL — ABNORMAL HIGH (ref 70–99)
Glucose-Capillary: 156 mg/dL — ABNORMAL HIGH (ref 70–99)
Glucose-Capillary: 164 mg/dL — ABNORMAL HIGH (ref 70–99)

## 2020-04-08 SURGERY — SHUNT INSERTION VENTRICULAR-PERITONEAL
Anesthesia: General | Site: Head | Laterality: Right

## 2020-04-08 MED ORDER — SODIUM CHLORIDE 0.9% FLUSH
3.0000 mL | Freq: Two times a day (BID) | INTRAVENOUS | Status: DC
  Administered 2020-04-08 (×2): 3 mL via INTRAVENOUS

## 2020-04-08 MED ORDER — PROPOFOL 10 MG/ML IV BOLUS
INTRAVENOUS | Status: AC
  Filled 2020-04-08: qty 20

## 2020-04-08 MED ORDER — LIDOCAINE-EPINEPHRINE 1 %-1:100000 IJ SOLN
INTRAMUSCULAR | Status: DC | PRN
  Administered 2020-04-08: 6 mL

## 2020-04-08 MED ORDER — THROMBIN (RECOMBINANT) 5000 UNITS EX SOLR
CUTANEOUS | Status: AC
  Filled 2020-04-08: qty 10000

## 2020-04-08 MED ORDER — ESMOLOL HCL 100 MG/10ML IV SOLN
INTRAVENOUS | Status: AC
  Filled 2020-04-08: qty 10

## 2020-04-08 MED ORDER — PHENOL 1.4 % MT LIQD: 1.0000 | OROMUCOSAL | Status: DC | PRN

## 2020-04-08 MED ORDER — THROMBIN 5000 UNITS EX SOLR
CUTANEOUS | Status: AC
  Filled 2020-04-08: qty 10000

## 2020-04-08 MED ORDER — SODIUM CHLORIDE 0.9 % IV SOLN: INTRAVENOUS | Status: DC | PRN

## 2020-04-08 MED ORDER — LIDOCAINE-EPINEPHRINE 1 %-1:100000 IJ SOLN
INTRAMUSCULAR | Status: AC
  Filled 2020-04-08: qty 1

## 2020-04-08 MED ORDER — FENTANYL CITRATE (PF) 100 MCG/2ML IJ SOLN: 25.0000 ug | INTRAMUSCULAR | Status: DC | PRN

## 2020-04-08 MED ORDER — ACETAMINOPHEN 10 MG/ML IV SOLN: 1000.0000 mg | Freq: Once | INTRAVENOUS | Status: DC | PRN

## 2020-04-08 MED ORDER — METFORMIN HCL 500 MG PO TABS
1000.0000 mg | ORAL_TABLET | Freq: Every day | ORAL | Status: DC
  Administered 2020-04-09: 1000 mg via ORAL
  Filled 2020-04-08: qty 2

## 2020-04-08 MED ORDER — ACETAMINOPHEN 500 MG PO TABS: 1000.0000 mg | ORAL_TABLET | Freq: Once | ORAL | Status: DC | PRN

## 2020-04-08 MED ORDER — CHLORHEXIDINE GLUCONATE CLOTH 2 % EX PADS: 6.0000 | MEDICATED_PAD | Freq: Once | CUTANEOUS | Status: DC

## 2020-04-08 MED ORDER — SODIUM CHLORIDE 0.9 % IV SOLN
250.0000 mL | INTRAVENOUS | Status: DC
  Administered 2020-04-08: 250 mL via INTRAVENOUS

## 2020-04-08 MED ORDER — LIDOCAINE 2% (20 MG/ML) 5 ML SYRINGE
INTRAMUSCULAR | Status: AC
  Filled 2020-04-08: qty 5

## 2020-04-08 MED ORDER — ATORVASTATIN CALCIUM 10 MG PO TABS
5.0000 mg | ORAL_TABLET | Freq: Every day | ORAL | Status: DC
  Administered 2020-04-08: 5 mg via ORAL
  Filled 2020-04-08: qty 1

## 2020-04-08 MED ORDER — 0.9 % SODIUM CHLORIDE (POUR BTL) OPTIME
TOPICAL | Status: DC | PRN
  Administered 2020-04-08: 1000 mL

## 2020-04-08 MED ORDER — OXYCODONE HCL 5 MG PO TABS: 5.0000 mg | ORAL_TABLET | Freq: Once | ORAL | Status: DC | PRN

## 2020-04-08 MED ORDER — HYDROCODONE-ACETAMINOPHEN 10-325 MG PO TABS: 1.0000 | ORAL_TABLET | ORAL | Status: DC | PRN

## 2020-04-08 MED ORDER — HYDROMORPHONE HCL 1 MG/ML IJ SOLN: 1.0000 mg | INTRAMUSCULAR | Status: DC | PRN

## 2020-04-08 MED ORDER — HYDROCODONE-ACETAMINOPHEN 5-325 MG PO TABS
1.0000 | ORAL_TABLET | ORAL | Status: DC | PRN
  Administered 2020-04-08 – 2020-04-09 (×3): 1 via ORAL
  Filled 2020-04-08 (×3): qty 1

## 2020-04-08 MED ORDER — METFORMIN HCL 500 MG PO TABS
500.0000 mg | ORAL_TABLET | Freq: Every day | ORAL | Status: DC
  Administered 2020-04-08: 500 mg via ORAL
  Filled 2020-04-08: qty 1

## 2020-04-08 MED ORDER — SODIUM CHLORIDE 0.9% FLUSH: 3.0000 mL | INTRAVENOUS | Status: DC | PRN

## 2020-04-08 MED ORDER — ACETAMINOPHEN 325 MG PO TABS
650.0000 mg | ORAL_TABLET | ORAL | Status: DC | PRN
  Administered 2020-04-09: 650 mg via ORAL
  Filled 2020-04-08: qty 2

## 2020-04-08 MED ORDER — LISINOPRIL 20 MG PO TABS
40.0000 mg | ORAL_TABLET | Freq: Every day | ORAL | Status: DC
  Administered 2020-04-08 – 2020-04-09 (×2): 40 mg via ORAL
  Filled 2020-04-08 (×2): qty 2

## 2020-04-08 MED ORDER — THROMBIN 5000 UNITS EX SOLR
CUTANEOUS | Status: DC | PRN
  Administered 2020-04-08 (×2): 5000 [IU] via TOPICAL

## 2020-04-08 MED ORDER — HEMOSTATIC AGENTS (NO CHARGE) OPTIME
TOPICAL | Status: DC | PRN
  Administered 2020-04-08: 1 via TOPICAL

## 2020-04-08 MED ORDER — VITAMIN D (ERGOCALCIFEROL) 1.25 MG (50000 UNIT) PO CAPS
50000.0000 [IU] | ORAL_CAPSULE | ORAL | Status: DC
  Administered 2020-04-09: 50000 [IU] via ORAL
  Filled 2020-04-08: qty 1

## 2020-04-08 MED ORDER — PROPOFOL 10 MG/ML IV BOLUS
INTRAVENOUS | Status: DC | PRN
  Administered 2020-04-08: 10 mg via INTRAVENOUS
  Administered 2020-04-08: 40 mg via INTRAVENOUS
  Administered 2020-04-08: 60 mg via INTRAVENOUS

## 2020-04-08 MED ORDER — DARIFENACIN HYDROBROMIDE ER 7.5 MG PO TB24
7.5000 mg | ORAL_TABLET | Freq: Every day | ORAL | Status: DC
  Administered 2020-04-08: 7.5 mg via ORAL
  Filled 2020-04-08 (×3): qty 1

## 2020-04-08 MED ORDER — FENTANYL CITRATE (PF) 100 MCG/2ML IJ SOLN
INTRAMUSCULAR | Status: DC | PRN
  Administered 2020-04-08: 75 ug via INTRAVENOUS

## 2020-04-08 MED ORDER — SUGAMMADEX SODIUM 200 MG/2ML IV SOLN
INTRAVENOUS | Status: DC | PRN
  Administered 2020-04-08: 150 mg via INTRAVENOUS

## 2020-04-08 MED ORDER — ONDANSETRON HCL 4 MG/2ML IJ SOLN
INTRAMUSCULAR | Status: DC | PRN
  Administered 2020-04-08: 4 mg via INTRAVENOUS

## 2020-04-08 MED ORDER — CEFAZOLIN SODIUM-DEXTROSE 1-4 GM/50ML-% IV SOLN
1.0000 g | Freq: Three times a day (TID) | INTRAVENOUS | Status: AC
  Administered 2020-04-08 – 2020-04-09 (×2): 1 g via INTRAVENOUS
  Filled 2020-04-08 (×2): qty 50

## 2020-04-08 MED ORDER — VITAMIN D 25 MCG (1000 UNIT) PO TABS
5000.0000 [IU] | ORAL_TABLET | ORAL | Status: DC
  Administered 2020-04-08: 5000 [IU] via ORAL
  Filled 2020-04-08: qty 5

## 2020-04-08 MED ORDER — PHENYLEPHRINE 40 MCG/ML (10ML) SYRINGE FOR IV PUSH (FOR BLOOD PRESSURE SUPPORT)
PREFILLED_SYRINGE | INTRAVENOUS | Status: DC | PRN
  Administered 2020-04-08: 80 ug via INTRAVENOUS

## 2020-04-08 MED ORDER — ROCURONIUM BROMIDE 10 MG/ML (PF) SYRINGE
PREFILLED_SYRINGE | INTRAVENOUS | Status: AC
  Filled 2020-04-08: qty 10

## 2020-04-08 MED ORDER — DEXAMETHASONE SODIUM PHOSPHATE 10 MG/ML IJ SOLN
10.0000 mg | Freq: Once | INTRAMUSCULAR | Status: AC
  Administered 2020-04-08: 10 mg via INTRAVENOUS
  Filled 2020-04-08: qty 1

## 2020-04-08 MED ORDER — ACETAMINOPHEN 650 MG RE SUPP: 650.0000 mg | RECTAL | Status: DC | PRN

## 2020-04-08 MED ORDER — FENTANYL CITRATE (PF) 250 MCG/5ML IJ SOLN
INTRAMUSCULAR | Status: AC
  Filled 2020-04-08: qty 5

## 2020-04-08 MED ORDER — LACTATED RINGERS IV SOLN: INTRAVENOUS | Status: DC | PRN

## 2020-04-08 MED ORDER — ONDANSETRON HCL 4 MG PO TABS: 4.0000 mg | ORAL_TABLET | Freq: Four times a day (QID) | ORAL | Status: DC | PRN

## 2020-04-08 MED ORDER — OXYCODONE HCL 5 MG/5ML PO SOLN: 5.0000 mg | Freq: Once | ORAL | Status: DC | PRN

## 2020-04-08 MED ORDER — ONDANSETRON HCL 4 MG/2ML IJ SOLN
INTRAMUSCULAR | Status: AC
  Filled 2020-04-08: qty 2

## 2020-04-08 MED ORDER — MENTHOL 3 MG MT LOZG: 1.0000 | LOZENGE | OROMUCOSAL | Status: DC | PRN

## 2020-04-08 MED ORDER — ONDANSETRON HCL 4 MG/2ML IJ SOLN: 4.0000 mg | Freq: Four times a day (QID) | INTRAMUSCULAR | Status: DC | PRN

## 2020-04-08 MED ORDER — CYCLOBENZAPRINE HCL 10 MG PO TABS: 10.0000 mg | ORAL_TABLET | Freq: Three times a day (TID) | ORAL | Status: DC | PRN

## 2020-04-08 MED ORDER — ESMOLOL HCL 100 MG/10ML IV SOLN
INTRAVENOUS | Status: DC | PRN
  Administered 2020-04-08: 10 mg via INTRAVENOUS
  Administered 2020-04-08: 30 mg via INTRAVENOUS
  Administered 2020-04-08: 20 mg via INTRAVENOUS

## 2020-04-08 MED ORDER — BACITRACIN ZINC 500 UNIT/GM EX OINT
TOPICAL_OINTMENT | CUTANEOUS | Status: AC
  Filled 2020-04-08: qty 28.35

## 2020-04-08 MED ORDER — ROCURONIUM BROMIDE 100 MG/10ML IV SOLN
INTRAVENOUS | Status: DC | PRN
  Administered 2020-04-08: 50 mg via INTRAVENOUS

## 2020-04-08 MED ORDER — BACITRACIN ZINC 500 UNIT/GM EX OINT
TOPICAL_OINTMENT | CUTANEOUS | Status: DC | PRN
  Administered 2020-04-08: 1 via TOPICAL

## 2020-04-08 MED ORDER — CEFAZOLIN SODIUM-DEXTROSE 2-4 GM/100ML-% IV SOLN
2.0000 g | INTRAVENOUS | Status: AC
  Administered 2020-04-08: 2 g via INTRAVENOUS
  Filled 2020-04-08: qty 100

## 2020-04-08 MED ORDER — PANTOPRAZOLE SODIUM 20 MG PO TBEC
20.0000 mg | DELAYED_RELEASE_TABLET | Freq: Two times a day (BID) | ORAL | Status: DC
  Administered 2020-04-08 – 2020-04-09 (×3): 20 mg via ORAL
  Filled 2020-04-08 (×3): qty 1

## 2020-04-08 MED ORDER — ACETAMINOPHEN 160 MG/5ML PO SOLN: 1000.0000 mg | Freq: Once | ORAL | Status: DC | PRN

## 2020-04-08 MED ORDER — LIDOCAINE 2% (20 MG/ML) 5 ML SYRINGE
INTRAMUSCULAR | Status: DC | PRN
  Administered 2020-04-08: 60 mg via INTRAVENOUS

## 2020-04-08 SURGICAL SUPPLY — 76 items
ADH SKN CLS APL DERMABOND .7 (GAUZE/BANDAGES/DRESSINGS) ×1
APL SKNCLS STERI-STRIP NONHPOA (GAUZE/BANDAGES/DRESSINGS) ×1
BAG DECANTER FOR FLEXI CONT (MISCELLANEOUS) ×3 IMPLANT
BAND INSRT 18 STRL LF DISP RB (MISCELLANEOUS)
BAND RUBBER #18 3X1/16 STRL (MISCELLANEOUS) IMPLANT
BENZOIN TINCTURE PRP APPL 2/3 (GAUZE/BANDAGES/DRESSINGS) ×3 IMPLANT
BLADE SURG 11 STRL SS (BLADE) ×3 IMPLANT
BNDG ADH 1X3 SHEER STRL LF (GAUZE/BANDAGES/DRESSINGS) ×3 IMPLANT
BNDG ADH THN 3X1 STRL LF (GAUZE/BANDAGES/DRESSINGS)
BNDG GAUZE ELAST 4 BULKY (GAUZE/BANDAGES/DRESSINGS) IMPLANT
BUR ACORN 6.0 PRECISION (BURR) ×2 IMPLANT
BUR ACORN 6.0MM PRECISION (BURR) ×1
CANISTER SUCT 3000ML PPV (MISCELLANEOUS) ×3 IMPLANT
CARTRIDGE OIL MAESTRO DRILL (MISCELLANEOUS) ×1 IMPLANT
CATH VENTRICULAR 9CM (Shunt) ×2 IMPLANT
CLIP RANEY DISP (INSTRUMENTS) IMPLANT
CLOSURE WOUND 1/2 X4 (GAUZE/BANDAGES/DRESSINGS) ×2
COVER WAND RF STERILE (DRAPES) ×1 IMPLANT
DERMABOND ADVANCED (GAUZE/BANDAGES/DRESSINGS) ×2
DERMABOND ADVANCED .7 DNX12 (GAUZE/BANDAGES/DRESSINGS) ×1 IMPLANT
DIFFUSER DRILL AIR PNEUMATIC (MISCELLANEOUS) ×3 IMPLANT
DRAPE INCISE IOBAN 85X60 (DRAPES) ×3 IMPLANT
DRAPE ORTHO SPLIT 77X108 STRL (DRAPES) ×6
DRAPE SURG 17X23 STRL (DRAPES) IMPLANT
DRAPE SURG ORHT 6 SPLT 77X108 (DRAPES) ×2 IMPLANT
DRSG OPSITE 4X5.5 SM (GAUZE/BANDAGES/DRESSINGS) ×2 IMPLANT
DRSG OPSITE POSTOP 3X4 (GAUZE/BANDAGES/DRESSINGS) ×4 IMPLANT
ELECT REM PT RETURN 9FT ADLT (ELECTROSURGICAL) ×3
ELECTRODE REM PT RTRN 9FT ADLT (ELECTROSURGICAL) ×1 IMPLANT
GAUZE 4X4 16PLY RFD (DISPOSABLE) IMPLANT
GLOVE BIOGEL PI IND STRL 7.5 (GLOVE) IMPLANT
GLOVE BIOGEL PI INDICATOR 7.5 (GLOVE) ×4
GLOVE ECLIPSE 9.0 STRL (GLOVE) ×3 IMPLANT
GLOVE EXAM NITRILE XL STR (GLOVE) IMPLANT
GLOVE SURG SS PI 7.0 STRL IVOR (GLOVE) ×8 IMPLANT
GOWN STRL REUS W/ TWL LRG LVL3 (GOWN DISPOSABLE) IMPLANT
GOWN STRL REUS W/ TWL XL LVL3 (GOWN DISPOSABLE) IMPLANT
GOWN STRL REUS W/TWL 2XL LVL3 (GOWN DISPOSABLE) IMPLANT
GOWN STRL REUS W/TWL LRG LVL3 (GOWN DISPOSABLE) ×3
GOWN STRL REUS W/TWL XL LVL3 (GOWN DISPOSABLE) ×9
HEMOSTAT SURGICEL 2X14 (HEMOSTASIS) IMPLANT
KIT BASIN OR (CUSTOM PROCEDURE TRAY) ×3 IMPLANT
KIT TURNOVER KIT B (KITS) ×3 IMPLANT
MARKER SKIN DUAL TIP RULER LAB (MISCELLANEOUS) ×1 IMPLANT
NEEDLE HYPO 22GX1.5 SAFETY (NEEDLE) ×2 IMPLANT
NS IRRIG 1000ML POUR BTL (IV SOLUTION) ×3 IMPLANT
OIL CARTRIDGE MAESTRO DRILL (MISCELLANEOUS) ×3
PACK LAMINECTOMY NEURO (CUSTOM PROCEDURE TRAY) ×3 IMPLANT
PAD ARMBOARD 7.5X6 YLW CONV (MISCELLANEOUS) ×7 IMPLANT
SHEATH PERITONEAL INTRO 46 (MISCELLANEOUS) IMPLANT
SHEATH PERITONEAL INTRO 61 (MISCELLANEOUS) IMPLANT
SHUNT STRATA 11 SNAP REG (Shunt) ×2 IMPLANT
SPONGE INTESTINAL PEANUT (DISPOSABLE) IMPLANT
SPONGE LAP 4X18 RFD (DISPOSABLE) IMPLANT
SPONGE SURGIFOAM ABS GEL SZ50 (HEMOSTASIS) ×2 IMPLANT
STAPLER VISISTAT 35W (STAPLE) ×3 IMPLANT
STRIP CLOSURE SKIN 1/2X4 (GAUZE/BANDAGES/DRESSINGS) ×4 IMPLANT
SUT CHROMIC 3 0 SH 27 (SUTURE) IMPLANT
SUT ETHILON 3 0 FSL (SUTURE) IMPLANT
SUT ETHILON 4 0 PS 2 18 (SUTURE) IMPLANT
SUT NURALON 4 0 TR CR/8 (SUTURE) IMPLANT
SUT SILK 0 TIES 10X30 (SUTURE) IMPLANT
SUT SILK 2 0 TIES 10X30 (SUTURE) ×2 IMPLANT
SUT SILK 2 0 TIES 17X18 (SUTURE)
SUT SILK 2-0 18XBRD TIE BLK (SUTURE) ×1 IMPLANT
SUT SILK 3 0 SH 30 (SUTURE) IMPLANT
SUT VIC AB 2-0 CT2 18 VCP726D (SUTURE) ×3 IMPLANT
SUT VIC AB 3-0 SH 8-18 (SUTURE) ×3 IMPLANT
SUT VICRYL 4-0 PS2 18IN ABS (SUTURE) IMPLANT
SYR 5ML LL (SYRINGE) IMPLANT
SYR CONTROL 10ML LL (SYRINGE) ×1 IMPLANT
TOWEL GREEN STERILE (TOWEL DISPOSABLE) ×3 IMPLANT
TOWEL GREEN STERILE FF (TOWEL DISPOSABLE) ×3 IMPLANT
TRAY FOLEY MTR SLVR 16FR STAT (SET/KITS/TRAYS/PACK) IMPLANT
UNDERPAD 30X36 HEAVY ABSORB (UNDERPADS AND DIAPERS) ×1 IMPLANT
WATER STERILE IRR 1000ML POUR (IV SOLUTION) ×3 IMPLANT

## 2020-04-08 NOTE — H&P (Signed)
Robert Gross is an 86 y.o. male.   Chief Complaint: Gait instability HPI: 86 year old male with progressive cognitive decline, dementia and gait instability with associated urinary incontinence.  Symptoms began approximately 1-1/2 years ago after a fall with resultant traumatic brain injury.  Symptoms have greatly worsened over the past 3 months.  Work-up demonstrates evidence of significant ventriculomegaly with periependymal edema.  Patient has undergone 2 different lumbar punctures with therapeutic CSF drainage which has given him significant symptom improvement.  Patient presents now for ventriculoperitoneal shunting in hopes of improving his symptoms from communicating hydrocephalus.  Past Medical History:  Diagnosis Date  . Arthritis   . BPH (benign prostatic hyperplasia)   . BPH with urinary obstruction   . Cataracts, bilateral   . Dizzy spells    residual from concussion 09-05-2017 intermittantly when turns head but stated as of 09-26-2017 no issues for past 2-3 days  . Essential hypertension   . Gait abnormality   . GERD (gastroesophageal reflux disease)   . History of concussion    09-05-2017  w/ loc --- per pt residual intermittant dizziness when he turns his head either way  . History of prostatitis   . History of sepsis    09-12-2017  urosepsis  . History of urinary retention   . Hydrocephalus ([place])   . Hypercalcemia   . Hyperlipemia   . Memory loss   . Metabolic encephalopathy   . Osteopenia   . Squamous cell carcinoma in situ    multiple spots  . Type 2 diabetes mellitus ([place])   . Urinary retention    requires intermittent caths  . Vitamin D deficiency   . Vocal cord paralysis   . Wears partial dentures    upper    Past Surgical History:  Procedure Laterality Date  . CATARACT EXTRACTION W/ INTRAOCULAR LENS  IMPLANT, BILATERAL  03/2017  . EXICISION EPIDERMAL CYST LEFT THUMB  09-12-2001   dr sypher  [facility]  . INGUINAL HERNIA REPAIR Right 11-07-2007   dr  Dalbert Batman [facility]  . KNEE ARTHROSCOPY Right 1990s  . MIDDLE EAR SURGERY     cyst removal  . SQUAMOUS CELL CARCINOMA EXCISION    . TRANSURETHRAL RESECTION OF PROSTATE  07-26-2000  dr Amalia Hailey [facility]  . TRANSURETHRAL RESECTION OF PROSTATE N/A 09/28/2017   Procedure: BIPOLAR TRANSURETHRAL RESECTION OF THE PROSTATE (TURP);  Surgeon: Lucas Mallow, MD;  Location: [facility];  Service: Urology;  Laterality: N/A;    Family History  Problem Relation Age of Onset  . Pancreatic cancer Mother   . Melanoma Sister   . Lung cancer Brother   . Cancer Father        unsure of type   Social History:  reports that he quit smoking about 52 years ago. His smoking use included cigarettes. He quit after 1.00 year of use. He has never used smokeless tobacco. He reports previous alcohol use. He reports that he does not use drugs.  Allergies: No Known Allergies  Medications Prior to Admission  Medication Sig Dispense Refill  . atorvastatin (LIPITOR) 10 MG tablet Take 5 mg by mouth daily.     . cholecalciferol (VITAMIN D) 1000 units tablet Take 5,000 Units by mouth See admin instructions. Taking daily except on Wednesday    . lisinopril (PRINIVIL,ZESTRIL) 40 MG tablet Take 40 mg by mouth daily.     . metFORMIN (GLUCOPHAGE) 500 MG tablet Take 500-1,000 mg by mouth See admin instructions. Take 1000 mg in the  morning and 500 mg in the evening    . pantoprazole (PROTONIX) 20 MG tablet Take 20 mg 2 (two) times daily by mouth.     . trospium (SANCTURA) 20 MG tablet Take 20 mg by mouth at bedtime.     . Vitamin D, Ergocalciferol, (DRISDOL) 1.25 MG (50000 UNIT) CAPS capsule Take 50,000 Units by mouth every 7 (seven) days. Wednesday      Results for orders placed or performed during the hospital encounter of 04/08/20 (from the past 48 hour(s))  Glucose, capillary     Status: Abnormal   Collection Time: 04/08/20  6:34 AM  Result Value Ref Range   Glucose-Capillary 110 (H) 70 - 99 mg/dL    Comment: Glucose  reference range applies only to samples taken after fasting for at least 8 hours.  Basic metabolic panel     Status: Abnormal   Collection Time: 04/08/20  6:47 AM  Result Value Ref Range   Sodium 141 135 - 145 mmol/L   Potassium 3.6 3.5 - 5.1 mmol/L   Chloride 107 98 - 111 mmol/L   CO2 26 22 - 32 mmol/L   Glucose, Bld 123 (H) 70 - 99 mg/dL    Comment: Glucose reference range applies only to samples taken after fasting for at least 8 hours.   BUN 21 8 - 23 mg/dL   Creatinine, Ser 0.91 0.61 - 1.24 mg/dL   Calcium 10.0 8.9 - 10.3 mg/dL   GFR calc non Af Amer >60 >60 mL/min   GFR calc Af Amer >60 >60 mL/min   Anion gap 8 5 - 15    Comment: Performed at [facility] [address] [place], [place] [address]  CBC WITH DIFFERENTIAL     Status: Abnormal   Collection Time: 04/08/20  6:47 AM  Result Value Ref Range   WBC 8.9 4.0 - 10.5 K/uL   RBC 3.86 (L) 4.22 - 5.81 MIL/uL   Hemoglobin 12.7 (L) 13.0 - 17.0 g/dL   HCT 38.2 (L) 39.0 - 52.0 %   MCV 99.0 80.0 - 100.0 fL   MCH 32.9 26.0 - 34.0 pg   MCHC 33.2 30.0 - 36.0 g/dL   RDW 13.2 11.5 - 15.5 %   Platelets 201 150 - 400 K/uL   nRBC 0.0 0.0 - 0.2 %   Neutrophils Relative % 64 %   Neutro Abs 5.7 1.7 - 7.7 K/uL   Lymphocytes Relative 14 %   Lymphs Abs 1.2 0.7 - 4.0 K/uL   Monocytes Relative 8 %   Monocytes Absolute 0.7 0.1 - 1.0 K/uL   Eosinophils Relative 12 %   Eosinophils Absolute 1.1 (H) 0.0 - 0.5 K/uL   Basophils Relative 1 %   Basophils Absolute 0.1 0.0 - 0.1 K/uL   Immature Granulocytes 1 %   Abs Immature Granulocytes 0.05 0.00 - 0.07 K/uL    Comment: Performed at [facility], [address], [place], [place] [address]   No results found.  Pertinent items noted in HPI and remainder of comprehensive ROS otherwise negative.  Blood pressure (!) 150/67, pulse 82, temperature (!) 97.5 F (36.4 C), temperature source Tympanic, resp. rate 17, height 5\' 8"  (1.727 m), weight 59 kg, SpO2 97 %.  Patient is awake and  aware.  He is moderately confused.  His speech is halting but his content is fairly good.  He is oriented to person not to time or place.  Cranial nerve function normal bilateral.  Motor examination  5/5 bilateral.  Sensory examination nonfocal.  Deep tender versus normal active.  No evidence of long track signs.  Gait is unsteady.  Posture is mildly flexed peer examination head ears eyes nose throat are unremarkable..  Chest and abdomen are benign.  Extremities are free from injury or deformity. Assessment/Plan Communicating hydrocephalus which has benefited from therapeutic CSF drainage x2.  Plan VP shunt placement in hopes of improving his symptoms.  Risks and benefits of been explained.  Patient wishes to proceed.  Robert Gross 04/08/2020, 7:52 AM

## 2020-04-08 NOTE — Anesthesia Preprocedure Evaluation (Addendum)
Anesthesia Evaluation  Patient identified by MRN, date of birth, ID band Patient awake    Reviewed: Allergy & Precautions, NPO status , Patient's Chart, lab work & pertinent test results  History of Anesthesia Complications Negative for: history of anesthetic complications  Airway Mallampati: [name]  TM Distance: <3 FB Neck ROM: Full    Dental  (+) Partial Upper   Pulmonary neg recent URI, former smoker,    breath sounds clear to auscultation       Cardiovascular hypertension, Pt. on medications (-) angina(-) Past MI and (-) CHF  Rhythm:Regular     Neuro/Psych hydrocephalus    GI/Hepatic Neg liver ROS,   Endo/Other  diabetes, Type 2  Renal/GU negative Renal ROS     Musculoskeletal  (+) Arthritis ,   Abdominal   Peds  Hematology  (+) Blood dyscrasia, anemia ,   Anesthesia Other Findings  86 year old male with progressive cognitive decline, dementia and gait instability with associated urinary incontinence.  Symptoms began approximately 1-1/2 years ago after a fall with resultant traumatic brain injury.  Symptoms have greatly worsened over the past 3 months.  Work-up demonstrates evidence of significant ventriculomegaly with periependymal edema.  Patient has undergone 2 different lumbar punctures with therapeutic CSF drainage which has given him significant symptom improvement.  Patient presents now for ventriculoperitoneal shunting in hopes of improving his symptoms from communicating hydrocephalus.  Reproductive/Obstetrics                            Anesthesia Physical Anesthesia Plan  ASA: II  Anesthesia Plan: General   Post-op Pain Management:    Induction: Intravenous  PONV Risk Score and Plan: 2 and Ondansetron and Dexamethasone  Airway Management Planned: Oral ETT  Additional Equipment: None  Intra-op Plan:   Post-operative Plan: Extubation in OR  Informed Consent: I have  reviewed the patients History and Physical, chart, labs and discussed the procedure including the risks, benefits and alternatives for the proposed anesthesia with the patient or authorized representative who has indicated his/her understanding and acceptance.     Dental advisory given  Plan Discussed with: CRNA and Surgeon  Anesthesia Plan Comments:         Anesthesia Quick Evaluation

## 2020-04-08 NOTE — Anesthesia Procedure Notes (Signed)
Procedure Name: Intubation Date/Time: 04/08/2020 8:28 AM Performed by: Gwyndolyn Saxon, CRNA Pre-anesthesia Checklist: Patient identified, Emergency Drugs available, Suction available and Patient being monitored Patient Re-evaluated:Patient Re-evaluated prior to induction Oxygen Delivery Method: Circle system utilized Preoxygenation: Pre-oxygenation with 100% oxygen Induction Type: IV induction Ventilation: Mask ventilation without difficulty Laryngoscope Size: Mac and 3 Grade View: Grade [name] Tube type: Oral Tube size: 7.0 mm Number of attempts: 2 Airway Equipment and Method: Patient positioned with wedge pillow and Bougie stylet Placement Confirmation: positive ETCO2 and breath sounds checked- equal and bilateral Secured at: 22 cm Tube secured with: Tape Dental Injury: Teeth and Oropharynx as per pre-operative assessment  Difficulty Due To: Difficulty was anticipated, Difficult Airway- due to reduced neck mobility and Difficult Airway- due to anterior larynx

## 2020-04-08 NOTE — Brief Op Note (Signed)
04/08/2020  9:18 AM  PATIENT:  Robert Gross  86 y.o. male  PRE-OPERATIVE DIAGNOSIS:  Hydrocephalus  POST-OPERATIVE DIAGNOSIS:  Hydrocephalus  PROCEDURE:  Procedure(s): Shunt Placment - right occipital (Right)  SURGEON:  Surgeon(s) and Role:     * Earnie Larsson, MD - Primary  PHYSICIAN ASSISTANT:   ASSISTANTS: Bergman<NP   ANESTHESIA:   general  EBL:  10 mL   BLOOD ADMINISTERED:none  DRAINS: none   LOCAL MEDICATIONS USED:  LIDOCAINE   SPECIMEN:  No Specimen  DISPOSITION OF SPECIMEN:  N/A  COUNTS:  YES  TOURNIQUET:  * No tourniquets in log *  DICTATION: .Dragon Dictation  PLAN OF CARE: Admit to inpatient   PATIENT DISPOSITION:  PACU - hemodynamically stable.   Delay start of Pharmacological VTE agent (>24hrs) due to surgical blood loss or risk of bleeding: yes

## 2020-04-08 NOTE — Anesthesia Postprocedure Evaluation (Signed)
Anesthesia Post Note  Patient: Robert Gross  Procedure(s) Performed: Shunt Placment - right occipital (Right Head)     Patient location during evaluation: PACU Anesthesia Type: General Level of consciousness: awake and patient cooperative Pain management: pain level controlled Vital Signs Assessment: post-procedure vital signs reviewed and stable Respiratory status: spontaneous breathing, nonlabored ventilation, respiratory function stable and patient connected to nasal cannula oxygen Cardiovascular status: blood pressure returned to baseline and stable Postop Assessment: no apparent nausea or vomiting Anesthetic complications: no    Last Vitals:  Vitals:   04/08/20 1046 04/08/20 1100  BP: (!) 165/74 (!) 164/71  Pulse: 95 94  Resp: 20 (!) 21  Temp:  37.1 C  SpO2: 96% 96%    Last Pain:  Vitals:   04/08/20 1100  TempSrc:   PainSc: 0-No pain                 Haniel Fix

## 2020-04-08 NOTE — Op Note (Signed)
Date of procedure: 04/08/2020  Date of dictation: Same  Service: Neurosurgery  Preoperative diagnosis: Communicating hydrocephalus  Postoperative diagnosis: Same  Procedure Name: Right occipital ventriculoperitoneal shunt  Surgeon:Sandie Swayze A.Khira Cudmore, M.D.  Asst. Surgeon: Reinaldo Meeker, NP  Anesthesia: General  Indication: 86 year old male with communicating hydrocephalus.  Patient presents for VP shunt.  Operative note: After induction anesthesia, patient positioned supine with neck turned toward the left and appropriately padded.  Patient's left occipital scalp neck chest and abdomen were prepped and draped sterilely.  Incision was made in his upper mid abdomen.  Paramedial approach was performed.  Anterior rectus sheath was divided.  Rectus muscle fibers were separated and the posterior rectus sheath was elevated and incised.  The peritoneum was identified and bluntly entered.  Good visualization of the peritoneal contents was achieved.  No evidence of bowel injury or other complicating features.  Attention then placed to the cranial region.  A right occipital incision was made.  A single bur hole was made.  A pocket inferior to the incision was made for shunt valve placement.  A shunt passer was then passed from the cranial wound exiting the abdominal wound.  A [employer] strata adjustable valve set at 0.5 was then passed through the shunt passer with the distal tube externalized.  The dura was coagulated and incised.  A 9 cm ventricular catheter was then placed into the lateral ventricle with good return of CSF under pressure.  This was connected to the proximal aspect of the shunt system using a snap connector.  Good flow was observed through the distal end of the shunt catheter.  The distal end of the shunt catheter was then passed into the peritoneal cavity without difficulty.  Wounds were then irrigated.  Both wounds were then closed in a typical fashion.  Sterile dressings were applied.  No apparent  complications.  Patient tolerated the procedure well and he returns to the recovery room postop.

## 2020-04-08 NOTE — Transfer of Care (Signed)
Immediate Anesthesia Transfer of Care Note  Patient: Robert Gross  Procedure(s) Performed: Shunt Placment - right occipital (Right Head)  Patient Location: PACU  Anesthesia Type:General  Level of Consciousness: awake and patient cooperative  Airway & Oxygen Therapy: Patient Spontanous Breathing and Patient connected to face mask oxygen  Post-op Assessment: Report given to RN and Post -op Vital signs reviewed and stable  Post vital signs: Reviewed and stable  Last Vitals:  Vitals Value Taken Time  BP 148/105 04/08/20 0925  Temp    Pulse 99 04/08/20 0928  Resp 18 04/08/20 0928  SpO2 97 % 04/08/20 0928  Vitals shown include unvalidated device data.  Last Pain:  Vitals:   04/08/20 0714  TempSrc:   PainSc: 0-No pain      Patients Stated Pain Goal: 5 ([id number])  Complications: No apparent anesthesia complications

## 2020-04-08 NOTE — Evaluation (Signed)
Physical Therapy Evaluation Patient Details Name: Robert Gross MRN: [id number] DOB: 1929-01-07 Today's Date: 04/08/2020   History of Present Illness  Pt is an 86 y/o male s/p R occipital shunt placement secondary to communicating hydrocephalus. PMH includes dementia, TBI, and DM.   Clinical Impression  Pt is s/p surgery above with deficits below. Pt requiring min to min guard A for mobility tasks using RW. Pt reports some abdominal pain, but otherwise tolerated mobility well. Reports wife can assist as needed. Will continue to follow acutely to maximize functional mobility independence and safety.     Follow Up Recommendations Home health PT;Supervision/Assistance - 24 hour    Equipment Recommendations  None recommended by PT    Recommendations for Other Services       Precautions / Restrictions Precautions Precautions: Fall Restrictions Weight Bearing Restrictions: No      Mobility  Bed Mobility Overal bed mobility: Needs Assistance Bed Mobility: Supine to Sit;Sit to Supine     Supine to sit: Min assist Sit to supine: Min guard   General bed mobility comments: Min A for trunk assist to come to sitting. Increased time required.   Transfers Overall transfer level: Needs assistance Equipment used: Rolling walker (2 wheeled) Transfers: Sit to/from Stand Sit to Stand: Min assist         General transfer comment: Min A for steadying assist to stand. Cues for safe hand placement.   Ambulation/Gait Ambulation/Gait assistance: Min guard;Min assist Gait Distance (Feet): 125 Feet Assistive device: Rolling walker (2 wheeled) Gait Pattern/deviations: Step-through pattern;Decreased stride length;Trunk flexed Gait velocity: Decreased   General Gait Details: Mild unsteadiness. Cues for upright posture and proximity to device. Increased unsteadiness noted when turning.   Stairs            Wheelchair Mobility    Modified Rankin (Stroke Patients Only)        Balance Overall balance assessment: Needs assistance Sitting-balance support: No upper extremity supported;Feet supported Sitting balance-Leahy Scale: Fair     Standing balance support: Bilateral upper extremity supported;During functional activity Standing balance-Leahy Scale: Poor Standing balance comment: Reliant on BUE support                              Pertinent Vitals/Pain Pain Assessment: Faces Faces Pain Scale: Hurts even more Pain Location: abdomen and head Pain Descriptors / Indicators: Aching;Operative site guarding Pain Intervention(s): Limited activity within patient's tolerance;Monitored during session;Repositioned    Home Living Family/patient expects to be discharged to:: Private residence Living Arrangements: Spouse/significant other Available Help at Discharge: Family;Available 24 hours/day Type of Home: House Home Access: Ramped entrance     Home Layout: One level Home Equipment: Cane - single point;Walker - 2 wheels      Prior Function Level of Independence: Independent with assistive device(s)         Comments: Has been using RW at home for stability. Reports independence with ADLs.      Hand Dominance        Extremity/Trunk Assessment   Upper Extremity Assessment Upper Extremity Assessment: Defer to OT evaluation    Lower Extremity Assessment Lower Extremity Assessment: Generalized weakness    Cervical / Trunk Assessment Cervical / Trunk Assessment: Kyphotic  Communication   Communication: No difficulties  Cognition Arousal/Alertness: Awake/alert Behavior During Therapy: WFL for tasks assessed/performed Overall Cognitive Status: History of cognitive impairments - at baseline  General Comments: Dementia and TBI at baseline       General Comments General comments (skin integrity, edema, etc.): Pt's wife and daughter in room during session.     Exercises      Assessment/Plan    PT Assessment Patient needs continued PT services  PT Problem List Decreased strength;Decreased balance;Decreased mobility;Decreased cognition;Decreased knowledge of use of DME;Decreased safety awareness;Decreased knowledge of precautions       PT Treatment Interventions DME instruction;Gait training;Functional mobility training;Therapeutic activities;Balance training;Therapeutic exercise;Patient/family education    PT Goals (Current goals can be found in the Care Plan section)  Acute Rehab PT Goals Patient Stated Goal: to go home PT Goal Formulation: With patient Time For Goal Achievement: 04/22/20 Potential to Achieve Goals: Good    Frequency Min 5X/week   Barriers to discharge        Co-evaluation               AM-PAC PT "6 Clicks" Mobility  Outcome Measure Help needed turning from your back to your side while in a flat bed without using bedrails?: A Little Help needed moving from lying on your back to sitting on the side of a flat bed without using bedrails?: A Little Help needed moving to and from a bed to a chair (including a wheelchair)?: A Little Help needed standing up from a chair using your arms (e.g., wheelchair or bedside chair)?: A Little Help needed to walk in hospital room?: A Little Help needed climbing 3-5 steps with a railing? : A Lot 6 Click Score: 17    End of Session Equipment Utilized During Treatment: Gait belt Activity Tolerance: Patient tolerated treatment well Patient left: in bed;with call bell/phone within reach;with family/visitor present Nurse Communication: Mobility status PT Visit Diagnosis: Unsteadiness on feet (R26.81);Muscle weakness (generalized) (M62.81)    Time: [id number] PT Time Calculation (min) (ACUTE ONLY): 23 min   Charges:   PT Evaluation $PT Eval Low Complexity: 1 Low PT Treatments $Gait Training: 8-22 mins        Robert Gross, DPT  Acute Rehabilitation Services  Pager: [phone] Office: [phone]   Robert Gross 04/08/2020, 5:54 PM

## 2020-04-09 ENCOUNTER — Encounter: Payer: Self-pay | Admitting: *Deleted

## 2020-04-09 LAB — GLUCOSE, CAPILLARY: Glucose-Capillary: 134 mg/dL — ABNORMAL HIGH (ref 70–99)

## 2020-04-09 NOTE — Progress Notes (Signed)
Physical Therapy Treatment Patient Details Name: Robert Gross MRN: [id number] DOB: Mar 18, 1929 Today's Date: 04/09/2020    History of Present Illness Pt is an 86 y/o male s/p R occipital shunt placement secondary to communicating hydrocephalus. PMH includes dementia, TBI, and DM.     PT Comments    Pt making steady progress overall with functional mobility. He continues to require multimodal cueing to maintain attention to task, which is very likely his baseline. His wife was present throughout session as well. Pt would continue to benefit from skilled physical therapy services at this time while admitted and after d/c to address the below listed limitations in order to improve overall safety and independence with functional mobility.    Follow Up Recommendations  Home health PT;Supervision/Assistance - 24 hour     Equipment Recommendations  None recommended by PT    Recommendations for Other Services       Precautions / Restrictions Precautions Precautions: Fall Restrictions Weight Bearing Restrictions: No    Mobility  Bed Mobility Overal bed mobility: Needs Assistance Bed Mobility: Supine to Sit;Sit to Supine     Supine to sit: Supervision     General bed mobility comments: pt OOB in recliner chair upon arrival  Transfers Overall transfer level: Needs assistance Equipment used: Rolling walker (2 wheeled) Transfers: Sit to/from Stand Sit to Stand: Min guard         General transfer comment: min guard for safety, good technique  Ambulation/Gait Ambulation/Gait assistance: Min guard Gait Distance (Feet): 200 Feet Assistive device: Rolling walker (2 wheeled) Gait Pattern/deviations: Trunk flexed;Drifts right/left;Step-through pattern Gait velocity: Decreased   General Gait Details: pt with mild instability with use of RW, but no overt LOB or need for physical assistance. Close min guard for safety and frequent cueing to maintain proximity to [employer]    Modified Rankin (Stroke Patients Only)       Balance Overall balance assessment: Needs assistance Sitting-balance support: No upper extremity supported;Feet supported Sitting balance-Leahy Scale: Good     Standing balance support: Bilateral upper extremity supported;During functional activity Standing balance-Leahy Scale: Poor Standing balance comment: Reliant on BUE support                             Cognition Arousal/Alertness: Awake/alert Behavior During Therapy: WFL for tasks assessed/performed Overall Cognitive Status: History of cognitive impairments - at baseline                                 General Comments: Dementia and TBI at baseline       Exercises      General Comments General comments (skin integrity, edema, etc.): no family present at this time. Spoke with Rn regarding need to educate wife on clean linens and washing around incision site      Pertinent Vitals/Pain Pain Assessment: Faces Faces Pain Scale: Hurts a little bit Pain Location: abdomen R lateral side Pain Descriptors / Indicators: Sore Pain Intervention(s): Monitored during session;Repositioned    Home Living Family/patient expects to be discharged to:: Private residence Living Arrangements: Spouse/significant other Available Help at Discharge: Family;Available 24 hours/day Type of Home: House Home Access: Ramped entrance   Home Layout: One level Home Equipment: Cane - single point;Walker - 2 wheels      Prior Function  Level of Independence: Independent with assistive device(s)      Comments: Has been using RW at home for stability. Reports independence with ADLs.    PT Goals (current goals can now be found in the care plan section) Acute Rehab PT Goals Patient Stated Goal: to go home PT Goal Formulation: With patient Time For Goal Achievement: 04/22/20 Potential to Achieve Goals: Good Progress towards PT goals:  Progressing toward goals    Frequency    Min 5X/week      PT Plan Current plan remains appropriate    Co-evaluation              AM-PAC PT "6 Clicks" Mobility   Outcome Measure  Help needed turning from your back to your side while in a flat bed without using bedrails?: A Little Help needed moving from lying on your back to sitting on the side of a flat bed without using bedrails?: A Little Help needed moving to and from a bed to a chair (including a wheelchair)?: None Help needed standing up from a chair using your arms (e.g., wheelchair or bedside chair)?: None Help needed to walk in hospital room?: None Help needed climbing 3-5 steps with a railing? : A Lot 6 Click Score: 20    End of Session   Activity Tolerance: Patient tolerated treatment well Patient left: in chair;with call bell/phone within reach;with family/visitor present Nurse Communication: Mobility status PT Visit Diagnosis: Unsteadiness on feet (R26.81);Muscle weakness (generalized) (M62.81)     Time: 0950-1005 PT Time Calculation (min) (ACUTE ONLY): 15 min  Charges:  $Gait Training: 8-22 mins                     Anastasio Champion, DPT  Acute Rehabilitation Services Pager [phone] Office [place] 04/09/2020, 12:20 PM

## 2020-04-09 NOTE — Discharge Instructions (Addendum)
Wound Care Keep incision covered and dry for two days.   Do not put any creams, lotions, or ointments on incision. Leave steri-strips on HEAD  They will fall off by themselves. Activity Walk each and every day, increasing distance each day. No lifting greater than 5 lbs.  Avoid excessive neck motion.  Diet Resume your normal diet.  . Call Your Doctor If Any of These Occur Redness, drainage, or swelling at the wound.  Temperature greater than 101 degrees. Severe pain not relieved by pain medication. Incision starts to come apart. Follow Up Appt Call  [phone])  for problems.  If you have any hardware placed in your spine, you will need an x-ray before your appointment.

## 2020-04-09 NOTE — Discharge Summary (Signed)
Physician Discharge Summary  Patient ID: Robert Gross MRN: [id number] DOB/AGE: [birth year] 86 y.o.  Admit date: 04/08/2020 Discharge date: 04/09/2020  Admission Diagnoses:  Discharge Diagnoses:  Active Problems:   Communicating hydrocephalus [facility])   Discharged Condition: good  Hospital Course: Patient admitted to the hospital where he underwent uncomplicated right-sided VP shunt placement for treatment of communicating hydrocephalus.  Postop with the patient is significantly improved.  He is much brighter and more interactive.  He remains a little bit confused but this is also significantly better.  He is standing and walking better.  His urinary incontinence is unchanged.  He is tolerating a regular diet.  His pain level is minimal.  He is ready for discharge home with family.  Consults:   Significant Diagnostic Studies:   Treatments:   Discharge Exam: Blood pressure (!) 149/68, pulse (!) 102, temperature 97.8 F (36.6 C), temperature source Oral, resp. rate 18, height 5\' 8"  (1.727 m), weight 59 kg, SpO2 94 %. Awake and alert.  Oriented to person and month but not to place.  Speech is fluent.  Cranial nerve function normal bilateral.  Motor 5/5 bilaterally.  Wounds clean and dry.  Abdomen soft.  Chest clear.    Disposition:   Discharge Instructions    Face-to-face encounter (required for Medicare/Medicaid patients)   Complete by: As directed    I Charlie Pitter certify that this patient is under my care and that I, or a nurse practitioner or physician's assistant working with me, had a face-to-face encounter that meets the physician face-to-face encounter requirements with this patient on 04/09/2020. The encounter with the patient was in whole, or in part for the following medical condition(s) which is the primary reason for home health care (List medical condition): Communicating hydrocephalus   The encounter with the patient was in whole, or in part, for the following medical  condition, which is the primary reason for home health care: Communicating hydrocephalus   I certify that, based on my findings, the following services are medically necessary home health services: Physical therapy   Reason for Medically Necessary Home Health Services: Therapy- Home Adaptation to Facilitate Safety   My clinical findings support the need for the above services: Cognitive impairments, dementia, or mental confusion  that make it unsafe to leave home   Further, I certify that my clinical findings support that this patient is homebound due to: Unable to leave home safely without assistance   Home Health   Complete by: As directed    To provide the following care/treatments:  PT OT       Allergies as of 04/09/2020   No Known Allergies     Medication List    TAKE these medications   atorvastatin 10 MG tablet Commonly known as: LIPITOR Take 5 mg by mouth daily.   cholecalciferol 1000 units tablet Commonly known as: VITAMIN D Take 5,000 Units by mouth See admin instructions. Taking daily except on Wednesday   lisinopril 40 MG tablet Commonly known as: ZESTRIL Take 40 mg by mouth daily.   metFORMIN 500 MG tablet Commonly known as: GLUCOPHAGE Take 500-1,000 mg by mouth See admin instructions. Take 1000 mg in the morning and 500 mg in the evening   pantoprazole 20 MG tablet Commonly known as: PROTONIX Take 20 mg 2 (two) times daily by mouth.   trospium 20 MG tablet Commonly known as: SANCTURA Take 20 mg by mouth at bedtime.   Vitamin D (Ergocalciferol) 1.25 MG (50000 UNIT) Caps capsule  Commonly known as: DRISDOL Take 50,000 Units by mouth every 7 (seven) days. Wednesday        Signed: Mallie Mussel A Mirna Sutcliffe 04/09/2020, 8:06 AM

## 2020-04-09 NOTE — TOC Initial Note (Signed)
Transition of Care [facility]) - Initial/Assessment Note    Patient Details  Name: Robert Gross MRN: [id number] Date of Birth: 1929-09-20  Transition of Care [facility]) CM/SW Contact:    Benard Halsted, LCSW Phone Number: 04/09/2020, 8:38 AM  Clinical Narrative:                 CSW received consult for possible home health services at time of discharge. CSW spoke with patient's spouse regarding PT recommendation of Home Health PT at time of discharge. Patient's spouse confirmed that patient has been receiving services through [facility] and would like to resume. CSW contacted [facility] to make them aware of new orders. CSW confirmed PCP and address with patient. Patient's wife aware of discharge plan today and will come pick patient up soon. No further questions reported at this time.    Expected Discharge Plan: [place] Barriers to Discharge: No Barriers Identified   Patient Goals and CMS Choice Patient states their goals for this hospitalization and ongoing recovery are:: continued therapy CMS Medicare.gov Compare Post Acute Care list provided to:: Patient Represenative (must comment)(Spouse) Choice offered to / list presented to : Spouse  Expected Discharge Plan and Services Expected Discharge Plan: [place]   Discharge Planning Services: CM Consult Post Acute Care Choice: [place] arrangements for the past 2 months: Single Family Home                           HH Arranged: PT, OT [place] Agency: [facility] (formerly [employer]) Date [place]: 04/09/20 Time [place]: [phone] Representative spoke with at [place]: [place] Arrangements/Services Living arrangements for the past 2 months: [place] Lives with:: Spouse Patient language and need for interpreter reviewed:: Yes Do you feel safe going back to the place where you live?: Yes      Need for Family Participation in  Patient Care: Yes (Comment) Care giver support system in place?: Yes (comment) Current home services: Home PT, Home OT Criminal Activity/Legal Involvement Pertinent to Current Situation/Hospitalization: No - Comment as needed  Activities of Daily Living Home Assistive Devices/Equipment: Walker (specify type), CBG Meter, Dentures (specify type) ADL Screening (condition at time of admission) Patient's cognitive ability adequate to safely complete daily activities?: Yes Is the patient deaf or have difficulty hearing?: No Does the patient have difficulty seeing, even when wearing glasses/contacts?: No Does the patient have difficulty concentrating, remembering, or making decisions?: Yes Patient able to express need for assistance with ADLs?: Yes Does the patient have difficulty dressing or bathing?: Yes Independently performs ADLs?: No Communication: Independent Dressing (OT): Needs assistance Is this a change from baseline?: Pre-admission baseline Grooming: Independent Feeding: Independent Bathing: Needs assistance Is this a change from baseline?: Pre-admission baseline Toileting: Independent In/Out Bed: Needs assistance Is this a change from baseline?: Pre-admission baseline Walks in Home: Independent with device (comment) Does the patient have difficulty walking or climbing stairs?: Yes Weakness of Legs: Both Weakness of Arms/Hands: Both  Permission Sought/Granted Permission sought to share information with : Facility Sport and exercise psychologist, Family Supports Permission granted to share information with : Yes, Verbal Permission Granted  Share Information with NAME: [name]  Permission granted to share info w AGENCY: [facility] [employer] granted to share info w Relationship: Spouse  Permission granted to share info w Contact Information: [phone]  Emotional Assessment Appearance:: Appears stated  age Attitude/Demeanor/Rapport: Unable to Assess Affect (typically observed):  Unable to Assess Orientation: : Oriented to Self, Oriented to Place Alcohol / Substance Use: Not Applicable Psych Involvement: No (comment)  Admission diagnosis:  Communicating hydrocephalus ([place]) [G91.0] Patient Active Problem List   Diagnosis Date Noted  . Communicating hydrocephalus ([place]) 04/08/2020  . Gait abnormality 03/06/2020  . Memory loss 02/12/2020  . Candiduria   . Memory difficulties   . Fall   . Acute metabolic encephalopathy 0000000  . Lower urinary tract infectious disease   . Urinary retention due to benign prostatic hyperplasia 09/28/2017  . Acute urinary retention 09/12/2017  . Sepsis ([place]) 09/12/2017  . Abdominal pain 09/12/2017  . Essential hypertension   . Diabetes mellitus without complication ([place])   . GERD (gastroesophageal reflux disease)    PCP:  [name], MD Pharmacy:   [place], [place] [address] Phone: [phone] Fax: [phone]     Social Determinants of Health (SDOH) Interventions    Readmission Risk Interventions No flowsheet data found.

## 2020-04-09 NOTE — Evaluation (Signed)
Occupational Therapy Evaluation Patient Details Name: Robert Gross MRN: [id number] DOB: 1929/06/13 Today's Date: 04/09/2020    History of Present Illness 86 y/o male s/p R occipital shunt placement secondary to communicating hydrocephalus. PMH includes dementia, TBI, and DM.    Clinical Impression   Patient is s/p R occipital VP shunt surgery resulting in functional limitations due to the deficits listed below (see OT problem list). Pt currently requires min (A) for transfer and min (A) to don shoes. Pt is able to figure 4 to don socks.  Patient will benefit from skilled OT acutely to increase independence and safety with ADLS to allow discharge hhot.     Follow Up Recommendations  Home health OT    Equipment Recommendations  None recommended by OT    Recommendations for Other Services       Precautions / Restrictions Precautions Precautions: Fall Restrictions Weight Bearing Restrictions: No      Mobility Bed Mobility Overal bed mobility: Needs Assistance Bed Mobility: Supine to Sit;Sit to Supine     Supine to sit: Supervision     General bed mobility comments: pt needs increased time. but able to proceed with HOB 30 degrees  Transfers Overall transfer level: Needs assistance Equipment used: Rolling walker (2 wheeled) Transfers: Sit to/from Stand Sit to Stand: Min assist         General transfer comment: pt furniture walking in the room and needs stedy (A). pt with R lateral pain    Balance Overall balance assessment: Needs assistance Sitting-balance support: No upper extremity supported;Feet supported Sitting balance-Leahy Scale: Fair     Standing balance support: Bilateral upper extremity supported;During functional activity Standing balance-Leahy Scale: Poor Standing balance comment: Reliant on BUE support                            ADL either performed or assessed with clinical judgement   ADL Overall ADL's : Needs  assistance/impaired Eating/Feeding: Independent   Grooming: Brushing hair;Modified independent           Upper Body Dressing : Supervision/safety   Lower Body Dressing: Minimal assistance Lower Body Dressing Details (indicate cue type and reason): pt able to figure 4 cross and but needs (A) with tying shoes Toilet Transfer: Minimal assistance Toilet Transfer Details (indicate cue type and reason): pt pushing RW away states " i dont need that thing"         Functional mobility during ADLs: Minimal assistance       Vision Baseline Vision/History: No visual deficits       Perception     Praxis      Pertinent Vitals/Pain Pain Assessment: Faces Faces Pain Scale: Hurts even more Pain Location: abdomen R lateral side Pain Descriptors / Indicators: Aching;Operative site guarding Pain Intervention(s): Monitored during session;Premedicated before session;Repositioned     Hand Dominance Left   Extremity/Trunk Assessment Upper Extremity Assessment Upper Extremity Assessment: Overall WFL for tasks assessed   Lower Extremity Assessment Lower Extremity Assessment: Defer to PT evaluation   Cervical / Trunk Assessment Cervical / Trunk Assessment: Kyphotic   Communication Communication Communication: No difficulties   Cognition Arousal/Alertness: Awake/alert Behavior During Therapy: WFL for tasks assessed/performed Overall Cognitive Status: History of cognitive impairments - at baseline                                 General Comments: Dementia and TBI  at baseline    General Comments  no family present at this time. Spoke with Rn regarding need to educate wife on clean linens and washing around incision site    Exercises     Shoulder Instructions      Home Living Family/patient expects to be discharged to:: Private residence Living Arrangements: Spouse/significant other Available Help at Discharge: Family;Available 24 hours/day Type of Home:  House Home Access: Ramped entrance     Home Layout: One level     Bathroom Shower/Tub: Occupational psychologist: Handicapped height     Home Equipment: [place] - single point;Walker - 2 wheels          Prior Functioning/Environment Level of Independence: Independent with assistive device(s)        Comments: Has been using RW at home for stability. Reports independence with ADLs.         OT Problem List: Decreased strength;Decreased activity tolerance      OT Treatment/Interventions: Self-care/ADL training;Therapeutic exercise;DME and/or AE instruction;Energy conservation;Therapeutic activities;Cognitive remediation/compensation;Patient/family education;Balance training    OT Goals(Current goals can be found in the care plan section) Acute Rehab OT Goals Patient Stated Goal: to go home OT Goal Formulation: With patient Time For Goal Achievement: 04/23/20 Potential to Achieve Goals: Good  OT Frequency: Min 2X/week   Barriers to D/C:            Co-evaluation              AM-PAC OT "6 Clicks" Daily Activity     Outcome Measure Help from another person eating meals?: None Help from another person taking care of personal grooming?: None Help from another person toileting, which includes using toliet, bedpan, or urinal?: A Little Help from another person bathing (including washing, rinsing, drying)?: A Little Help from another person to put on and taking off regular upper body clothing?: None Help from another person to put on and taking off regular lower body clothing?: A Little 6 Click Score: 21   End of Session Nurse Communication: Mobility status;Precautions  Activity Tolerance: Patient tolerated treatment well Patient left: in chair;with call bell/phone within reach  OT Visit Diagnosis: Unsteadiness on feet (R26.81);Muscle weakness (generalized) (M62.81)                Time: [id number] OT Time Calculation (min): 18 min Charges:  OT General  Charges $OT Visit: 1 Visit OT Evaluation $OT Eval Moderate Complexity: 1 Mod   Brynn, OTR/L  Acute Rehabilitation Services Pager: [phone] Office: [phone] .   Jeri Modena 04/09/2020, 9:21 AM

## 2020-04-09 NOTE — Plan of Care (Signed)
Patient alert and oriented, mae's well, voiding adequate amount of urine, swallowing without difficulty, no c/o pain at time of discharge. Patient discharged home with family. Script and discharged instructions given to patient. Patient and family stated understanding of instructions given. Patient has an appointment with Dr. Pool  

## 2020-04-10 ENCOUNTER — Telehealth: Payer: Self-pay | Admitting: Neurology

## 2020-04-10 NOTE — Telephone Encounter (Signed)
Please call patient and his family, I have reviewed feedback from neurosurgeon, understands that he received VP shunt placement by Dr. Earnie Larsson  I am glad that he showed improvement with the treatment  Please give him a follow-up appointment in 3 months

## 2020-04-11 LAB — VDRL, CSF: VDRL Quant, CSF: NONREACTIVE

## 2020-04-11 LAB — CSF CELL COUNT WITH DIFFERENTIAL
RBC Count, CSF: 0 cells/uL
WBC, CSF: 1 cells/uL (ref 0–5)

## 2020-04-11 LAB — GRAM STAIN
GRAM STAIN:: NONE SEEN
MICRO NUMBER:: 10394286
SPECIMEN QUALITY:: ADEQUATE

## 2020-04-11 LAB — PROTEIN, CSF: Total Protein, CSF: 37 mg/dL (ref 15–60)

## 2020-04-11 LAB — FUNGUS CULTURE W SMEAR
CULTURE:: NO GROWTH
MICRO NUMBER:: 10394287
SMEAR:: NONE SEEN
SPECIMEN QUALITY:: ADEQUATE

## 2020-04-11 LAB — GLUCOSE, CSF: Glucose, CSF: 84 mg/dL — ABNORMAL HIGH (ref 40–80)

## 2020-04-14 ENCOUNTER — Telehealth: Payer: Self-pay | Admitting: Neurology

## 2020-04-14 DIAGNOSIS — R339 Retention of urine, unspecified: Secondary | ICD-10-CM | POA: Diagnosis not present

## 2020-04-14 NOTE — Telephone Encounter (Signed)
Returned call to [employer] and left message for okay to continue PT as outlined below. Provided our number to call back with any further needs.

## 2020-04-14 NOTE — Telephone Encounter (Signed)
I spoke to his daughter, [name]. She scheduled his follow up on 07/10/20.

## 2020-04-14 NOTE — Telephone Encounter (Signed)
Phone rep checked office voicemail's,@8 :16 PT Robert Gross w/ [facility] left message asking for verbal orders to resume pt's PT on 04-13-20 for 2 times a week for 2 weeks.  PT [name] stated his voicemail is secure and that the verbal order can be left on his voicemail

## 2020-04-17 ENCOUNTER — Ambulatory Visit: Payer: Medicare PPO | Admitting: Neurology

## 2020-04-22 ENCOUNTER — Telehealth: Payer: Self-pay | Admitting: Neurology

## 2020-04-22 DIAGNOSIS — R269 Unspecified abnormalities of gait and mobility: Secondary | ICD-10-CM

## 2020-04-22 NOTE — Telephone Encounter (Signed)
OT Robert Gross has called asking that Dr Krista Blue calls in a prescription to [facility] for a 3 in 1 commode fax# [phone].  Also OT Robert Gala is asking for Verbal orders order for once a week for 2 weeks

## 2020-04-22 NOTE — Addendum Note (Signed)
Addended by: Noberto Retort C on: 04/22/2020 10:36 AM   Modules accepted: Orders

## 2020-04-22 NOTE — Telephone Encounter (Signed)
I returned the call to [place] [facility]. I left a message letting her know the order for the requested commode has been signed by Dr. Krista Blue and faxed back to the number below. Also, provided the verbal okay to proceed with OT as outlined.

## 2020-04-23 ENCOUNTER — Emergency Department (HOSPITAL_COMMUNITY): Payer: Medicare PPO

## 2020-04-23 ENCOUNTER — Other Ambulatory Visit: Payer: Self-pay

## 2020-04-23 ENCOUNTER — Emergency Department (HOSPITAL_COMMUNITY)
Admission: EM | Admit: 2020-04-23 | Discharge: 2020-04-23 | Disposition: A | Discharge status: Home / Self Care | Payer: Medicare PPO | Attending: Emergency Medicine | Admitting: Emergency Medicine

## 2020-04-23 ENCOUNTER — Encounter (HOSPITAL_COMMUNITY): Payer: Self-pay | Admitting: Emergency Medicine

## 2020-04-23 DIAGNOSIS — I10 Essential (primary) hypertension: Secondary | ICD-10-CM | POA: Insufficient documentation

## 2020-04-23 DIAGNOSIS — E119 Type 2 diabetes mellitus without complications: Secondary | ICD-10-CM | POA: Insufficient documentation

## 2020-04-23 DIAGNOSIS — N3091 Cystitis, unspecified with hematuria: Secondary | ICD-10-CM | POA: Insufficient documentation

## 2020-04-23 DIAGNOSIS — R410 Disorientation, unspecified: Secondary | ICD-10-CM | POA: Diagnosis not present

## 2020-04-23 DIAGNOSIS — N3001 Acute cystitis with hematuria: Secondary | ICD-10-CM

## 2020-04-23 DIAGNOSIS — R270 Ataxia, unspecified: Secondary | ICD-10-CM | POA: Diagnosis not present

## 2020-04-23 DIAGNOSIS — R531 Weakness: Secondary | ICD-10-CM | POA: Insufficient documentation

## 2020-04-23 DIAGNOSIS — Z87891 Personal history of nicotine dependence: Secondary | ICD-10-CM | POA: Insufficient documentation

## 2020-04-23 DIAGNOSIS — Z7984 Long term (current) use of oral hypoglycemic drugs: Secondary | ICD-10-CM | POA: Insufficient documentation

## 2020-04-23 DIAGNOSIS — Z85828 Personal history of other malignant neoplasm of skin: Secondary | ICD-10-CM | POA: Diagnosis not present

## 2020-04-23 DIAGNOSIS — Z79899 Other long term (current) drug therapy: Secondary | ICD-10-CM | POA: Insufficient documentation

## 2020-04-23 DIAGNOSIS — Z982 Presence of cerebrospinal fluid drainage device: Secondary | ICD-10-CM | POA: Diagnosis not present

## 2020-04-23 DIAGNOSIS — T8509XA Other mechanical complication of ventricular intracranial (communicating) shunt, initial encounter: Secondary | ICD-10-CM | POA: Diagnosis not present

## 2020-04-23 LAB — URINALYSIS, ROUTINE W REFLEX MICROSCOPIC
Bilirubin Urine: NEGATIVE
Glucose, UA: 50 mg/dL — AB
Ketones, ur: NEGATIVE mg/dL
Nitrite: NEGATIVE
Protein, ur: 30 mg/dL — AB
RBC / HPF: >50 RBC/hpf — ABNORMAL HIGH (ref 0–5)
Specific Gravity, Urine: 1.023 (ref 1.005–1.030)
WBC, UA: >50 WBC/hpf — ABNORMAL HIGH (ref 0–5)
pH: 5 (ref 5.0–8.0)

## 2020-04-23 LAB — CBC
HCT: 36.9 % — ABNORMAL LOW (ref 39.0–52.0)
Hemoglobin: 12.3 g/dL — ABNORMAL LOW (ref 13.0–17.0)
MCH: 33.2 pg (ref 26.0–34.0)
MCHC: 33.3 g/dL (ref 30.0–36.0)
MCV: 99.7 fL (ref 80.0–100.0)
Platelets: 251 10*3/uL (ref 150–400)
RBC: 3.7 MIL/uL — ABNORMAL LOW (ref 4.22–5.81)
RDW: 13.4 % (ref 11.5–15.5)
WBC: 10.6 10*3/uL — ABNORMAL HIGH (ref 4.0–10.5)
nRBC: 0 % (ref 0.0–0.2)

## 2020-04-23 LAB — BASIC METABOLIC PANEL
Anion gap: 12 (ref 5–15)
BUN: 19 mg/dL (ref 8–23)
CO2: 23 mmol/L (ref 22–32)
Calcium: 9.8 mg/dL (ref 8.9–10.3)
Chloride: 106 mmol/L (ref 98–111)
Creatinine, Ser: 0.94 mg/dL (ref 0.61–1.24)
GFR calc Af Amer: >60 mL/min (ref >60)
GFR calc non Af Amer: >60 mL/min (ref >60)
Glucose, Bld: 131 mg/dL — ABNORMAL HIGH (ref 70–99)
Potassium: 3.7 mmol/L (ref 3.5–5.1)
Sodium: 141 mmol/L (ref 135–145)

## 2020-04-23 LAB — CBG MONITORING, ED: Glucose-Capillary: 99 mg/dL (ref 70–99)

## 2020-04-23 MED ORDER — SODIUM CHLORIDE 0.9% FLUSH: 3.0000 mL | Freq: Once | INTRAVENOUS | Status: DC

## 2020-04-23 MED ORDER — CEPHALEXIN 500 MG PO CAPS
500.0000 mg | ORAL_CAPSULE | Freq: Four times a day (QID) | ORAL | 0 refills | Status: DC
Start: 2020-04-23 — End: 2020-06-12

## 2020-04-23 NOTE — ED Notes (Signed)
Pt back from CT

## 2020-04-23 NOTE — ED Provider Notes (Signed)
[facility] EMERGENCY DEPARTMENT Provider Note   CSN: [id number] Arrival date & time: 04/23/20  1051     History Chief Complaint  Patient presents with  . Weakness    Robert Gross is a 86 y.o. male with a history of T2DM, hypertension, hyperlipidemia, and hydrocephalus status post VP shunt by Dr. Annette Stable in 05/18 who presents to the emergency department with his daughter for evaluation of decline over the past 2 weeks.  Per patient's daughter he seemed to be doing well after the surgical procedure, however about 2 weeks ago he seemed to have progressive decline with generalized weakness, difficulty controlling stools, and somewhat increased confusion.  No alleviating or aggravating factors to the symptoms.  He has been eating and drinking appropriately.  Patient's daughter states they called the neurosurgery office today and spoke with APP with neurosurgery who recommended they come to the emergency department for a CT scan to ensure no shunt dysfunction.  Patient has had issues with urinary incontinence and frequent urinary tract infections for several months.  They have not noted any fever or chills.  Patient denies headache, dizziness, balance problems, chest pain, abdominal pain, vomiting, or diarrhea.  Patient's daughter relays he has had a degree of confusion for several months now.  HPI     Past Medical History:  Diagnosis Date  . Arthritis   . BPH (benign prostatic hyperplasia)   . BPH with urinary obstruction   . Cataracts, bilateral   . Dizzy spells    residual from concussion 09-05-2017 intermittantly when turns head but stated as of 09-26-2017 no issues for past 2-3 days  . Essential hypertension   . Gait abnormality   . GERD (gastroesophageal reflux disease)   . History of concussion    09-05-2017  w/ loc --- per pt residual intermittant dizziness when he turns his head either way  . History of prostatitis   . History of sepsis    09-12-2017  urosepsis   . History of urinary retention   . Hydrocephalus ([place])   . Hypercalcemia   . Hyperlipemia   . Memory loss   . Metabolic encephalopathy   . Osteopenia   . Squamous cell carcinoma in situ    multiple spots  . Type 2 diabetes mellitus ([place])   . Urinary retention    requires intermittent caths  . Vitamin D deficiency   . Vocal cord paralysis   . Wears partial dentures    upper    Patient Active Problem List   Diagnosis Date Noted  . Communicating hydrocephalus ([place]) 04/08/2020  . Gait abnormality 03/06/2020  . Memory loss 02/12/2020  . Candiduria   . Memory difficulties   . Fall   . Acute metabolic encephalopathy 0000000  . Lower urinary tract infectious disease   . Urinary retention due to benign prostatic hyperplasia 09/28/2017  . Acute urinary retention 09/12/2017  . Sepsis ([place]) 09/12/2017  . Abdominal pain 09/12/2017  . Essential hypertension   . Diabetes mellitus without complication ([place])   . GERD (gastroesophageal reflux disease)     Past Surgical History:  Procedure Laterality Date  . CATARACT EXTRACTION W/ INTRAOCULAR LENS  IMPLANT, BILATERAL  03/2017  . EXICISION EPIDERMAL CYST LEFT THUMB  09-12-2001   dr sypher  [facility]  . INGUINAL HERNIA REPAIR Right 11-07-2007   dr Dalbert [facility]  . KNEE ARTHROSCOPY Right 1990s  . MIDDLE EAR SURGERY     cyst removal  . SQUAMOUS CELL CARCINOMA EXCISION    .  TRANSURETHRAL RESECTION OF PROSTATE  07-26-2000  dr Amalia [facility]  . TRANSURETHRAL RESECTION OF PROSTATE N/A 09/28/2017   Procedure: BIPOLAR TRANSURETHRAL RESECTION OF THE PROSTATE (TURP);  Surgeon: Lucas Mallow, MD;  Location: [facility];  Service: Urology;  Laterality: N/A;  . VENTRICULOPERITONEAL SHUNT Right 04/08/2020   Procedure: Shunt Placment - right occipital;  Surgeon: Earnie Larsson, MD;  Location: [place];  Service: Neurosurgery;  Laterality: Right;       Family History  Problem Relation Age of Onset  . Pancreatic cancer Mother   . Melanoma  Sister   . Lung cancer Brother   . Cancer Father        unsure of type    Social History   Tobacco Use  . Smoking status: Former Smoker    Years: 1.00    Types: Cigarettes    Quit date: 09/27/1967    Years since quitting: 52.6  . Smokeless tobacco: Never Used  Substance Use Topics  . Alcohol use: Not Currently  . Drug use: No    Home Medications Prior to Admission medications   Medication Sig Start Date End Date Taking? Authorizing Provider  atorvastatin (LIPITOR) 10 MG tablet Take 5 mg by mouth daily.     [provider]  cholecalciferol (VITAMIN D) 1000 units tablet Take 5,000 Units by mouth See admin instructions. Taking daily except on Wednesday    [provider]  lisinopril (PRINIVIL,ZESTRIL) 40 MG tablet Take 40 mg by mouth daily.     [provider]  metFORMIN (GLUCOPHAGE) 500 MG tablet Take 500-1,000 mg by mouth See admin instructions. Take 1000 mg in the morning and 500 mg in the evening    [provider]  pantoprazole (PROTONIX) 20 MG tablet Take 20 mg 2 (two) times daily by mouth.     [provider]  trospium (SANCTURA) 20 MG tablet Take 20 mg by mouth at bedtime.  02/07/20   [provider]  Vitamin D, Ergocalciferol, (DRISDOL) 1.25 MG (50000 UNIT) CAPS capsule Take 50,000 Units by mouth every 7 (seven) days. Wednesday    [provider]    Allergies    Patient has no known allergies.  Review of Systems   Review of Systems  Constitutional: Negative for chills and fever.  Respiratory: Negative for shortness of breath.   Cardiovascular: Negative for chest pain.  Gastrointestinal: Negative for abdominal pain and vomiting.       Positive for difficulty controlling stools.  Neurological: Positive for weakness (Generalized). Negative for dizziness and headaches.       Positive for incontinence- urinary, chronic.   Psychiatric/Behavioral: Positive for confusion.  All other systems reviewed and are  negative.   Physical Exam Updated Vital Signs BP (!) 160/64 (BP Location: Left Arm)   Pulse 90   Temp 98.1 F (36.7 C) (Oral)   Resp 15   SpO2 97%   Physical Exam Vitals and nursing note reviewed. Exam conducted with a chaperone present.  Constitutional:      General: He is not in acute distress.    Appearance: He is well-developed. He is not toxic-appearing.  HENT:     Head: Normocephalic and atraumatic.     Comments: Clean healing incision without erythema or drainage with staples in place to the right scalp. Eyes:     Extraocular Movements: Extraocular movements intact.     Conjunctiva/sclera: Conjunctivae normal.     Pupils: Pupils are equal, round, and reactive to light.  Cardiovascular:  Rate and Rhythm: Normal rate and regular rhythm.  Pulmonary:     Effort: Pulmonary effort is normal. No respiratory distress.     Breath sounds: Normal breath sounds. No wheezing, rhonchi or rales.  Abdominal:     General: There is no distension.     Palpations: Abdomen is soft.     Tenderness: There is no abdominal tenderness. There is no guarding or rebound.  Genitourinary:    Comments: Intact rectal tone.  Musculoskeletal:     Cervical back: Neck supple.  Skin:    General: Skin is warm and dry.     Findings: No rash.  Neurological:     Mental Status: He is alert.     Comments: Clear speech. CN [name]-XII grossly intact.  Sensation gross intact bilateral upper and lower extremities.  5 out of 5 symmetric grip strength.  5 out of 5 strength with ankle plantar dorsiflexion bilaterally.  Patient is able to lift bilateral lower extremities off of the stretcher and hold them up without difficulty.  Negative pronator drift.  Normal finger-to-nose.  Able to ambulate with assistance.   Psychiatric:        Behavior: Behavior normal.    ED Results / Procedures / Treatments   Labs (all labs ordered are listed, but only abnormal results are displayed) Labs Reviewed  BASIC METABOLIC  PANEL - Abnormal; Notable for the following components:      Result Value   Glucose, Bld 131 (*)    All other components within normal limits  CBC - Abnormal; Notable for the following components:   WBC 10.6 (*)    RBC 3.70 (*)    Hemoglobin 12.3 (*)    HCT 36.9 (*)    All other components within normal limits  URINALYSIS, ROUTINE W REFLEX MICROSCOPIC - Abnormal; Notable for the following components:   APPearance CLOUDY (*)    Glucose, UA 50 (*)    Hgb urine dipstick SMALL (*)    Protein, ur 30 (*)    Leukocytes,Ua LARGE (*)    RBC / HPF >50 (*)    WBC, UA >50 (*)    Bacteria, UA FEW (*)    All other components within normal limits  CBG MONITORING, ED    EKG None  Radiology DG Skull 1-3 Views  Result Date: 04/23/2020 CLINICAL DATA:  Ventriculoperitoneal shunt malfunction. EXAM: SKULL - 1-3 VIEW COMPARISON:  January 11, 2020. FINDINGS: The visualized portion of the right-sided ventriculoperitoneal shunt appears to be intact. It enters the right posterior skull. No other significant abnormality seen involving the skull. IMPRESSION: Visualized portion of right-sided ventriculoperitoneal shunt appears to be intact. Electronically Signed   By: Marijo Conception M.D.   On: 04/23/2020 14:55   DG Chest 1 View  Result Date: 04/23/2020 CLINICAL DATA:  Ventriculoperitoneal shunt malfunction. EXAM: CHEST  1 VIEW COMPARISON:  None. FINDINGS: The heart size and mediastinal contours are within normal limits. Both lungs are clear. The visualized skeletal structures are unremarkable. Visualized portion of right-sided ventriculoperitoneal shunt appears intact. IMPRESSION: Visualized portion of right-sided ventriculoperitoneal shunt appears intact. Electronically Signed   By: Marijo Conception M.D.   On: 04/23/2020 14:57   DG Abd 1 View  Result Date: 04/23/2020 CLINICAL DATA:  Ventriculoperitoneal shunt malfunction. EXAM: ABDOMEN - 1 VIEW COMPARISON:  None. FINDINGS: The bowel gas pattern is normal. No  radio-opaque calculi or other significant radiographic abnormality are seen. Distal tip of right-sided ventriculoperitoneal shunt is seen in the right side of  the abdomen. The catheter appears to be intact. IMPRESSION: Visualized portion of right-sided ventriculoperitoneal shunt appears to be intact. Electronically Signed   By: Marijo Conception M.D.   On: 04/23/2020 14:58   CT HEAD WO CONTRAST  Result Date: 04/23/2020 CLINICAL DATA:  Ataxia EXAM: CT HEAD WITHOUT CONTRAST TECHNIQUE: Contiguous axial images were obtained from the base of the skull through the vertex without intravenous contrast. COMPARISON:  January 11, 2020 FINDINGS: Brain: Diffuse atrophy is stable. There is no a shunt catheter with its tip in the midline in the lateral ventricular region slightly superior to the third ventricle. No change in ventricular size and contour. There is no intracranial mass, hemorrhage, extra-axial fluid collection, or midline shift. There is patchy small vessel disease in the centra semiovale bilaterally. There is mild decreased attenuation along the course of the shunt catheter, likely localized encephalomalacia along the shunt catheter tract. No acute infarct is appreciable. Vascular: No hyperdense vessels. There is calcification in each carotid siphon and distal vertebral artery region. Skull: There is a bony defect for shunt catheter placement in the right parietal bone. Bony calvarium otherwise appears intact. Sinuses/Orbits: There is mucosal thickening in several ethmoid air cells. There are small retention cysts in the anterior ethmoid region on the right. Visualized orbits appear symmetric bilaterally. Other: There is opacification of multiple mastoid air cells bilaterally. IMPRESSION: 1. Persistent atrophy with no change in ventricular size and configuration. Note that there is now a shunt catheter present in the midline slightly superior to the third ventricle without change in ventricular size or  configuration. 2. Small vessel disease in the centra semiovale bilaterally. Mild encephalomalacia along the shunt tract in the right parietal lobe. No acute appearing infarct. No mass or hemorrhage. 3.  Foci of arterial vascular calcification noted. 4.  There are foci paranasal sinus disease. 5.  Opacification in multiple mastoid air cells bilaterally. Electronically Signed   By: Lowella Grip [name] M.D.   On: 04/23/2020 13:39    Procedures Procedures (including critical care time)  Medications Ordered in ED Medications  sodium chloride flush (NS) 0.9 % injection 3 mL (has no administration in time range)    ED Course  I have reviewed the triage vital signs and the nursing notes.  Pertinent labs & imaging results that were available during my care of the patient were reviewed by me and considered in my medical decision making (see chart for details).    MDM Rules/Calculators/A&P                     Patient presents to the ED with his daughter due to concern for progressive decline with generalized weakness, difficulty controlling stools, and somewhat increased confusion x 2 weeks.  Patient is nontoxic, resting comfortably, vitals without significant abnormality, BP somewhat elevated, low suspicion for hypertensive emergency.  Patient has no focal neurologic deficits.  Anal tone is intact.  He is ambulatory with assistance and does use a walker at home.  Additional history obtained:  Additional history obtained from patient's daughter. Previous records obtained and reviewed.   Lab Tests:  I Ordered, reviewed, and interpreted labs, which included:  CBC: Mild anemia similar to prior.  Mild leukocytosis. BMP: No significant electrolyte derangement.  Renal function preserved. Urinalysis: Consistent with infection, will send for culture. Imaging Studies ordered:  I ordered imaging studies which included CT head & shunt series x-rays, I independently visualized and interpreted imaging which  showed no acute shunt dysfunction.  ED  Course:  Work-up overall reassuring.  No evidence of shunt dysfunction.  Urinalysis does show urinary tract infection which could be contributing to patient's symptoms.  Patient and his daughter are requesting to go home which I feel is reasonable at this time.  Patient does not appear septic or toxic.  No vomiting, flank pain, or fever to raise concern for pyelonephritis.  He has no focal neurologic deficits, is ambulatory, and has intact anal tone, no signs of cord compression.  He has close follow-up scheduled with neurosurgery tomorrow.  At this time will discharge home on Keflex with urine culture pending. I discussed results, treatment plan, need for follow-up, and return precautions with the patient and his daughter at bedside.. Provided opportunity for questions, patient and his daughter confirmed understanding and are in agreement with plan.   This is a shared visit with supervising physician Dr. Sedonia Small who has independently evaluated patient & provided guidance in evaluation/management/disposition, in agreement with care   Portions of this note were generated with Dragon dictation software. Dictation errors may occur despite best attempts at proofreading.  Final Clinical Impression(s) / ED Diagnoses Final diagnoses:  Weakness  Acute cystitis with hematuria    Rx / DC Orders ED Discharge Orders         Ordered    cephALEXin (KEFLEX) 500 MG capsule  4 times daily     04/23/20 1513           Daylen Lipsky, [place], PA-C 04/23/20 1515    Maudie Flakes, MD 04/28/20 1304

## 2020-04-23 NOTE — ED Triage Notes (Signed)
Pt arrives with daughter to ER with c/o of requesting CT scan after being advised by MD Pooles office to come to ER due to steady decline in walking generalized weakness since having a shunt placed in may. Denies any Falls.

## 2020-04-23 NOTE — Discharge Instructions (Addendum)
You were seen in the emergency department today for weakness.  Your shunt appears to be functioning appropriately.  Your labs do show that you have a urinary tract infection, we are treating this with Keflex, an antibiotic.  Please take the antibiotic as prescribed.  Please take all of your antibiotics until finished. You may develop abdominal discomfort or diarrhea from the antibiotic.  You may help offset this with probiotics which you can buy at the store (ask your pharmacist if unable to find) or get probiotics in the form of eating yogurt. Do not eat or take the probiotics until 2 hours after your antibiotic. If you are unable to tolerate these side effects follow-up with your primary care provider or return to the emergency department.   If you begin to experience any blistering, rashes, swelling, or difficulty breathing seek medical care for evaluation of potentially more serious side effects.   Please be aware that this medication may interact with other medications you are taking, please be sure to discuss your medication list with your pharmacist.   Please follow-up with your neurosurgeon's office tomorrow as scheduled.  Please also follow-up with your primary care provider within 3 days for reevaluation of your general symptoms.  Return to the ER for new or worsening symptoms including not limited to fever, worsening weakness, weakness to certain arm or leg, numbness, passing out, chest pain, trouble breathing, pain in the back, inability to keep fluids down, or any other concerns.

## 2020-04-24 NOTE — Telephone Encounter (Signed)
OT Robert Gross has called to inform that she gave phone rep wrong fax# please send the order for the 3 in 1 commode to fax#[phone], OT Robert Gala has apologized for giving the wrong fax#

## 2020-04-24 NOTE — Telephone Encounter (Signed)
Order faxed to the corrected fax number.

## 2020-04-25 LAB — URINE CULTURE: Culture: 80000 — AB

## 2020-04-27 ENCOUNTER — Telehealth: Payer: Self-pay | Admitting: Emergency Medicine

## 2020-04-27 NOTE — Telephone Encounter (Signed)
Post ED Visit - Positive Culture Follow-up: Successful Patient Follow-Up  Culture assessed and recommendations reviewed by:  []  Elenor Quinones, Pharm.D. []  Heide Guile, Pharm.D., BCPS AQ-ID []  Parks Neptune, Pharm.D., BCPS []  Alycia Rossetti, Pharm.D., BCPS []  [place], [place].D., BCPS, AAHIVP []  Legrand Como, Pharm.D., BCPS, AAHIVP []  Salome Arnt, PharmD, BCPS []  Johnnette Gourd, PharmD, BCPS []  Hughes Better, PharmD, BCPS [x]  Duanne Limerick, PharmD  Positive urine culture  [x]  Patient discharged without antimicrobial prescription and treatment is now indicated []  Organism is resistant to prescribed ED discharge antimicrobial []  Patient with positive blood cultures  Changes discussed with ED provider: Emeterio Reeve, PA New antibiotic prescription Fluconazole 10 mg PO daily x 14 days Called to [facility] [phone])  Contacted patient & daughter, date 04/27/2020, time [place] 04/27/2020, 5:08 PM

## 2020-04-28 ENCOUNTER — Telehealth: Payer: Self-pay | Admitting: Neurology

## 2020-04-28 NOTE — Telephone Encounter (Signed)
Robert B. PT from [facility] called needing VO for 2 times for 6 weeks and 1 time for 2 weeks starting 04/27/20. Please call back and VM can be left if he does not pick up.

## 2020-04-28 NOTE — Telephone Encounter (Signed)
I returned to call [name], PT w/ [facility]. He will proceed with PT as outlined below.

## 2020-05-02 NOTE — Telephone Encounter (Signed)
Robert Gross OT from [facility] called stating that once again she was given the wrong fax# for 3 in 1 commode order.  She states this should be the right one this time. [place] was very apologetic.

## 2020-05-05 DIAGNOSIS — R269 Unspecified abnormalities of gait and mobility: Secondary | ICD-10-CM | POA: Diagnosis not present

## 2020-05-05 NOTE — Telephone Encounter (Addendum)
The orders for the 3 in 1 commode have been re-faxed to the number below. The transmission has failed after two attempts.

## 2020-05-05 NOTE — Telephone Encounter (Signed)
Our office phone lines were down. The problem has been resolved. The orders have been faxed and confirmed to the number below.

## 2020-05-22 DIAGNOSIS — N401 Enlarged prostate with lower urinary tract symptoms: Secondary | ICD-10-CM | POA: Diagnosis not present

## 2020-05-22 DIAGNOSIS — R5383 Other fatigue: Secondary | ICD-10-CM | POA: Diagnosis not present

## 2020-05-22 DIAGNOSIS — E559 Vitamin D deficiency, unspecified: Secondary | ICD-10-CM | POA: Diagnosis not present

## 2020-05-22 DIAGNOSIS — E7849 Other hyperlipidemia: Secondary | ICD-10-CM | POA: Diagnosis not present

## 2020-05-22 DIAGNOSIS — E1169 Type 2 diabetes mellitus with other specified complication: Secondary | ICD-10-CM | POA: Diagnosis not present

## 2020-05-22 DIAGNOSIS — R413 Other amnesia: Secondary | ICD-10-CM | POA: Diagnosis not present

## 2020-05-22 DIAGNOSIS — R82998 Other abnormal findings in urine: Secondary | ICD-10-CM | POA: Diagnosis not present

## 2020-05-22 DIAGNOSIS — R2689 Other abnormalities of gait and mobility: Secondary | ICD-10-CM | POA: Diagnosis not present

## 2020-06-12 ENCOUNTER — Encounter (HOSPITAL_COMMUNITY): Payer: Self-pay | Admitting: Emergency Medicine

## 2020-06-12 ENCOUNTER — Emergency Department (HOSPITAL_COMMUNITY): Payer: Medicare PPO

## 2020-06-12 ENCOUNTER — Inpatient Hospital Stay (HOSPITAL_COMMUNITY)
Admission: EM | Admit: 2020-06-12 | Discharge: 2020-06-17 | DRG: 690 | Disposition: A | Discharge status: Home / Self Care | Payer: Medicare PPO | Attending: Family Medicine | Admitting: Family Medicine

## 2020-06-12 ENCOUNTER — Other Ambulatory Visit: Payer: Self-pay

## 2020-06-12 DIAGNOSIS — R4182 Altered mental status, unspecified: Secondary | ICD-10-CM

## 2020-06-12 DIAGNOSIS — Z20822 Contact with and (suspected) exposure to covid-19: Secondary | ICD-10-CM | POA: Diagnosis present

## 2020-06-12 DIAGNOSIS — R338 Other retention of urine: Secondary | ICD-10-CM | POA: Diagnosis present

## 2020-06-12 DIAGNOSIS — M1990 Unspecified osteoarthritis, unspecified site: Secondary | ICD-10-CM | POA: Diagnosis present

## 2020-06-12 DIAGNOSIS — G910 Communicating hydrocephalus: Secondary | ICD-10-CM | POA: Diagnosis present

## 2020-06-12 DIAGNOSIS — R531 Weakness: Secondary | ICD-10-CM | POA: Diagnosis not present

## 2020-06-12 DIAGNOSIS — Z7984 Long term (current) use of oral hypoglycemic drugs: Secondary | ICD-10-CM

## 2020-06-12 DIAGNOSIS — F0391 Unspecified dementia with behavioral disturbance: Secondary | ICD-10-CM | POA: Diagnosis not present

## 2020-06-12 DIAGNOSIS — K219 Gastro-esophageal reflux disease without esophagitis: Secondary | ICD-10-CM | POA: Diagnosis present

## 2020-06-12 DIAGNOSIS — Z808 Family history of malignant neoplasm of other organs or systems: Secondary | ICD-10-CM

## 2020-06-12 DIAGNOSIS — S060X9S Concussion with loss of consciousness of unspecified duration, sequela: Secondary | ICD-10-CM

## 2020-06-12 DIAGNOSIS — G319 Degenerative disease of nervous system, unspecified: Secondary | ICD-10-CM | POA: Diagnosis not present

## 2020-06-12 DIAGNOSIS — R0689 Other abnormalities of breathing: Secondary | ICD-10-CM | POA: Diagnosis not present

## 2020-06-12 DIAGNOSIS — N401 Enlarged prostate with lower urinary tract symptoms: Secondary | ICD-10-CM | POA: Diagnosis present

## 2020-06-12 DIAGNOSIS — Z982 Presence of cerebrospinal fluid drainage device: Secondary | ICD-10-CM | POA: Diagnosis not present

## 2020-06-12 DIAGNOSIS — Z86008 Personal history of in-situ neoplasm of other site: Secondary | ICD-10-CM

## 2020-06-12 DIAGNOSIS — D7589 Other specified diseases of blood and blood-forming organs: Secondary | ICD-10-CM | POA: Diagnosis present

## 2020-06-12 DIAGNOSIS — I10 Essential (primary) hypertension: Secondary | ICD-10-CM | POA: Diagnosis not present

## 2020-06-12 DIAGNOSIS — Z87440 Personal history of urinary (tract) infections: Secondary | ICD-10-CM | POA: Diagnosis not present

## 2020-06-12 DIAGNOSIS — X58XXXS Exposure to other specified factors, sequela: Secondary | ICD-10-CM | POA: Diagnosis present

## 2020-06-12 DIAGNOSIS — R2689 Other abnormalities of gait and mobility: Secondary | ICD-10-CM | POA: Diagnosis present

## 2020-06-12 DIAGNOSIS — R0902 Hypoxemia: Secondary | ICD-10-CM | POA: Diagnosis not present

## 2020-06-12 DIAGNOSIS — E8770 Fluid overload, unspecified: Secondary | ICD-10-CM | POA: Diagnosis not present

## 2020-06-12 DIAGNOSIS — E876 Hypokalemia: Secondary | ICD-10-CM | POA: Diagnosis not present

## 2020-06-12 DIAGNOSIS — N3941 Urge incontinence: Secondary | ICD-10-CM | POA: Diagnosis present

## 2020-06-12 DIAGNOSIS — E785 Hyperlipidemia, unspecified: Secondary | ICD-10-CM | POA: Diagnosis not present

## 2020-06-12 DIAGNOSIS — M8580 Other specified disorders of bone density and structure, unspecified site: Secondary | ICD-10-CM | POA: Diagnosis present

## 2020-06-12 DIAGNOSIS — I6782 Cerebral ischemia: Secondary | ICD-10-CM | POA: Diagnosis not present

## 2020-06-12 DIAGNOSIS — Z9079 Acquired absence of other genital organ(s): Secondary | ICD-10-CM

## 2020-06-12 DIAGNOSIS — Z800 Family history of malignant neoplasm of digestive organs: Secondary | ICD-10-CM

## 2020-06-12 DIAGNOSIS — R000 Tachycardia, unspecified: Secondary | ICD-10-CM | POA: Diagnosis not present

## 2020-06-12 DIAGNOSIS — Z79899 Other long term (current) drug therapy: Secondary | ICD-10-CM

## 2020-06-12 DIAGNOSIS — R0602 Shortness of breath: Secondary | ICD-10-CM | POA: Diagnosis not present

## 2020-06-12 DIAGNOSIS — E860 Dehydration: Secondary | ICD-10-CM | POA: Diagnosis present

## 2020-06-12 DIAGNOSIS — R9082 White matter disease, unspecified: Secondary | ICD-10-CM | POA: Diagnosis not present

## 2020-06-12 DIAGNOSIS — N390 Urinary tract infection, site not specified: Principal | ICD-10-CM | POA: Diagnosis present

## 2020-06-12 DIAGNOSIS — R404 Transient alteration of awareness: Secondary | ICD-10-CM | POA: Diagnosis not present

## 2020-06-12 DIAGNOSIS — E119 Type 2 diabetes mellitus without complications: Secondary | ICD-10-CM

## 2020-06-12 DIAGNOSIS — Z801 Family history of malignant neoplasm of trachea, bronchus and lung: Secondary | ICD-10-CM

## 2020-06-12 LAB — CBC
HCT: 32.3 % — ABNORMAL LOW (ref 39.0–52.0)
Hemoglobin: 10.4 g/dL — ABNORMAL LOW (ref 13.0–17.0)
MCH: 32.3 pg (ref 26.0–34.0)
MCHC: 32.2 g/dL (ref 30.0–36.0)
MCV: 100.3 fL — ABNORMAL HIGH (ref 80.0–100.0)
Platelets: 191 10*3/uL (ref 150–400)
RBC: 3.22 MIL/uL — ABNORMAL LOW (ref 4.22–5.81)
RDW: 13 % (ref 11.5–15.5)
WBC: 8.3 10*3/uL (ref 4.0–10.5)
nRBC: 0 % (ref 0.0–0.2)

## 2020-06-12 LAB — FOLATE: Folate: 5.2 ng/mL — ABNORMAL LOW (ref >5.9)

## 2020-06-12 LAB — CBC WITH DIFFERENTIAL/PLATELET
Abs Immature Granulocytes: 0.05 10*3/uL (ref 0.00–0.07)
Basophils Absolute: 0.1 10*3/uL (ref 0.0–0.1)
Basophils Relative: 1 %
Eosinophils Absolute: 0.4 10*3/uL (ref 0.0–0.5)
Eosinophils Relative: 4 %
HCT: 39.7 % (ref 39.0–52.0)
Hemoglobin: 13.1 g/dL (ref 13.0–17.0)
Immature Granulocytes: 1 %
Lymphocytes Relative: 11 %
Lymphs Abs: 1 10*3/uL (ref 0.7–4.0)
MCH: 33.2 pg (ref 26.0–34.0)
MCHC: 33 g/dL (ref 30.0–36.0)
MCV: 100.5 fL — ABNORMAL HIGH (ref 80.0–100.0)
Monocytes Absolute: 0.8 10*3/uL (ref 0.1–1.0)
Monocytes Relative: 9 %
Neutro Abs: 6.8 10*3/uL (ref 1.7–7.7)
Neutrophils Relative %: 74 %
Platelets: 203 10*3/uL (ref 150–400)
RBC: 3.95 MIL/uL — ABNORMAL LOW (ref 4.22–5.81)
RDW: 12.9 % (ref 11.5–15.5)
WBC: 9.1 10*3/uL (ref 4.0–10.5)
nRBC: 0 % (ref 0.0–0.2)

## 2020-06-12 LAB — URINALYSIS, ROUTINE W REFLEX MICROSCOPIC
Bilirubin Urine: NEGATIVE
Glucose, UA: NEGATIVE mg/dL
Hgb urine dipstick: NEGATIVE
Ketones, ur: 5 mg/dL — AB
Nitrite: NEGATIVE
Protein, ur: NEGATIVE mg/dL
Specific Gravity, Urine: 1.013 (ref 1.005–1.030)
pH: 6 (ref 5.0–8.0)

## 2020-06-12 LAB — COMPREHENSIVE METABOLIC PANEL
ALT: 15 U/L (ref 0–44)
AST: 12 U/L — ABNORMAL LOW (ref 15–41)
Albumin: 2.7 g/dL — ABNORMAL LOW (ref 3.5–5.0)
Alkaline Phosphatase: 87 U/L (ref 38–126)
Anion gap: 9 (ref 5–15)
BUN: 20 mg/dL (ref 8–23)
CO2: 24 mmol/L (ref 22–32)
Calcium: 10.7 mg/dL — ABNORMAL HIGH (ref 8.9–10.3)
Chloride: 105 mmol/L (ref 98–111)
Creatinine, Ser: 1.09 mg/dL (ref 0.61–1.24)
GFR calc Af Amer: >60 mL/min (ref >60)
GFR calc non Af Amer: 60 mL/min — ABNORMAL LOW (ref >60)
Glucose, Bld: 119 mg/dL — ABNORMAL HIGH (ref 70–99)
Potassium: 3.9 mmol/L (ref 3.5–5.1)
Sodium: 138 mmol/L (ref 135–145)
Total Bilirubin: 0.8 mg/dL (ref 0.3–1.2)
Total Protein: 5.6 g/dL — ABNORMAL LOW (ref 6.5–8.1)

## 2020-06-12 LAB — SARS CORONAVIRUS 2 BY RT PCR (HOSPITAL ORDER, PERFORMED IN ~~LOC~~ HOSPITAL LAB): SARS Coronavirus 2: NEGATIVE

## 2020-06-12 LAB — VITAMIN B12: Vitamin B-12: 219 pg/mL (ref 180–914)

## 2020-06-12 LAB — CREATININE, SERUM
Creatinine, Ser: 0.93 mg/dL (ref 0.61–1.24)
GFR calc Af Amer: >60 mL/min (ref >60)
GFR calc non Af Amer: >60 mL/min (ref >60)

## 2020-06-12 LAB — MAGNESIUM: Magnesium: 0.9 mg/dL — CL (ref 1.7–2.4)

## 2020-06-12 LAB — HEMOGLOBIN A1C
Hgb A1c MFr Bld: 5.6 % (ref 4.8–5.6)
Mean Plasma Glucose: 114.02 mg/dL

## 2020-06-12 LAB — CBG MONITORING, ED: Glucose-Capillary: 148 mg/dL — ABNORMAL HIGH (ref 70–99)

## 2020-06-12 LAB — LACTIC ACID, PLASMA
Lactic Acid, Venous: 1.5 mmol/L (ref 0.5–1.9)
Lactic Acid, Venous: 2 mmol/L (ref 0.5–1.9)

## 2020-06-12 MED ORDER — SODIUM CHLORIDE 0.9 % IV SOLN: INTRAVENOUS | Status: DC

## 2020-06-12 MED ORDER — SODIUM CHLORIDE 0.9 % IV SOLN
1.0000 g | INTRAVENOUS | Status: DC
  Administered 2020-06-13: 1 g via INTRAVENOUS
  Filled 2020-06-12: qty 10

## 2020-06-12 MED ORDER — ACETAMINOPHEN 650 MG RE SUPP: 650.0000 mg | Freq: Four times a day (QID) | RECTAL | Status: DC | PRN

## 2020-06-12 MED ORDER — INSULIN ASPART 100 UNIT/ML ~~LOC~~ SOLN: 0.0000 [IU] | Freq: Every day | SUBCUTANEOUS | Status: DC

## 2020-06-12 MED ORDER — SODIUM CHLORIDE 0.9 % IV BOLUS
1000.0000 mL | Freq: Once | INTRAVENOUS | Status: AC
  Administered 2020-06-12: 1000 mL via INTRAVENOUS

## 2020-06-12 MED ORDER — ACETAMINOPHEN 325 MG PO TABS: 650.0000 mg | ORAL_TABLET | Freq: Four times a day (QID) | ORAL | Status: DC | PRN

## 2020-06-12 MED ORDER — SODIUM CHLORIDE 0.9 % IV SOLN
1.0000 g | Freq: Once | INTRAVENOUS | Status: AC
  Administered 2020-06-12: 1 g via INTRAVENOUS
  Filled 2020-06-12: qty 10

## 2020-06-12 MED ORDER — PANTOPRAZOLE SODIUM 20 MG PO TBEC
20.0000 mg | DELAYED_RELEASE_TABLET | Freq: Two times a day (BID) | ORAL | Status: DC
  Administered 2020-06-12 – 2020-06-17 (×10): 20 mg via ORAL
  Filled 2020-06-12 (×12): qty 1

## 2020-06-12 MED ORDER — ONDANSETRON HCL 4 MG PO TABS: 4.0000 mg | ORAL_TABLET | Freq: Four times a day (QID) | ORAL | Status: DC | PRN

## 2020-06-12 MED ORDER — ONDANSETRON HCL 4 MG/2ML IJ SOLN: 4.0000 mg | Freq: Four times a day (QID) | INTRAMUSCULAR | Status: DC | PRN

## 2020-06-12 MED ORDER — LISINOPRIL 20 MG PO TABS
40.0000 mg | ORAL_TABLET | Freq: Every day | ORAL | Status: DC
  Administered 2020-06-12 – 2020-06-17 (×6): 40 mg via ORAL
  Filled 2020-06-12 (×6): qty 2

## 2020-06-12 MED ORDER — DARIFENACIN HYDROBROMIDE ER 7.5 MG PO TB24
7.5000 mg | ORAL_TABLET | Freq: Every day | ORAL | Status: DC
  Administered 2020-06-12 – 2020-06-17 (×6): 7.5 mg via ORAL
  Filled 2020-06-12 (×6): qty 1

## 2020-06-12 MED ORDER — ENOXAPARIN SODIUM 40 MG/0.4ML ~~LOC~~ SOLN
40.0000 mg | SUBCUTANEOUS | Status: DC
  Administered 2020-06-12 – 2020-06-16 (×5): 40 mg via SUBCUTANEOUS
  Filled 2020-06-12 (×5): qty 0.4

## 2020-06-12 MED ORDER — INSULIN ASPART 100 UNIT/ML ~~LOC~~ SOLN
0.0000 [IU] | Freq: Three times a day (TID) | SUBCUTANEOUS | Status: DC
  Administered 2020-06-13 – 2020-06-14 (×2): 2 [IU] via SUBCUTANEOUS
  Administered 2020-06-14 – 2020-06-15 (×2): 3 [IU] via SUBCUTANEOUS
  Administered 2020-06-15 – 2020-06-16 (×3): 2 [IU] via SUBCUTANEOUS
  Administered 2020-06-16: 1 [IU] via SUBCUTANEOUS
  Administered 2020-06-17: 2 [IU] via SUBCUTANEOUS

## 2020-06-12 NOTE — ED Provider Notes (Signed)
Medical screening examination/treatment/procedure(s) were conducted as a shared visit with non-physician practitioner(s) and myself.  I personally evaluated the patient during the encounter.  Clinical Impression:   Final diagnoses:  Altered mental status, unspecified altered mental status type    This patient is an elderly 86 year old male, living at home with his wife.  He has had some progressive weakness over recent months but this has become accelerated over the last couple of days.  Yesterday she was able to get him out of bed with significant difficulty and required a neighbor to come help him get back into bed but today they were unable to do anything with him at home because of the severe weakness.  She also reports increased amounts of confusion over the last 24 hours with some hallucinations.  He denies headache, there is been no fever or vomiting.  On my exam he has clear lung sounds, clear heart sounds, he is tachycardic to 105 and sinus tachycardia, he has been able to produce some urine which does have some signs of urinary tract infection.  He has known hydrocephalus with a diverting shunt.  The CT scan today shows mild chronic ischemic white matter disease and mild diffuse atrophy, stable position of the ventriculostomy catheter.  Unchanged lateral ventricles.  The patient is followed by [facility], he has been given Rocephin, he will likely need physical therapy and placement, will treat urinary tract infection.  D/w Dr. Doristine Bosworth - will admit   Noemi Chapel, MD 06/12/20 Carollee Massed

## 2020-06-12 NOTE — ED Provider Notes (Signed)
[facility]ERGENCY DEPARTMENT Provider Note   CSN: [id number] Arrival date & time: 06/12/20  1159     History Chief Complaint  Patient presents with  . Altered Mental Status    Robert Gross is a 86 y.o. male.  HPI      Shunt placed in May Unable to stand up and walk Had to call neighbors last night to help get him into bed Walks with walker normally Generally weak, began about 2 days ago, progressively worsening Spoke with Dr. Annette Stable who recommended he come to ED No nausea/vomiting No focal numbness/weakness. Doesn't speak much at baseline, sleeps a lot. No change. No change in vision per wife No pain, no headache No fever/cough/dysuria  Recent UTI 3 wk ago Has not had much to eat today   Past Medical History:  Diagnosis Date  . Arthritis   . BPH (benign prostatic hyperplasia)   . BPH with urinary obstruction   . Cataracts, bilateral   . Dizzy spells    residual from concussion 09-05-2017 intermittantly when turns head but stated as of 09-26-2017 no issues for past 2-3 days  . Essential hypertension   . Gait abnormality   . GERD (gastroesophageal reflux disease)   . History of concussion    09-05-2017  w/ loc --- per pt residual intermittant dizziness when he turns his head either way  . History of prostatitis   . History of sepsis    09-12-2017  urosepsis  . History of urinary retention   . Hydrocephalus ([place])   . Hypercalcemia   . Hyperlipemia   . Memory loss   . Metabolic encephalopathy   . Osteopenia   . Squamous cell carcinoma in situ    multiple spots  . Type 2 diabetes mellitus ([place])   . Urinary retention    requires intermittent caths  . Vitamin D deficiency   . Vocal cord paralysis   . Wears partial dentures    upper    Patient Active Problem List   Diagnosis Date Noted  . Communicating hydrocephalus ([place]) 04/08/2020  . Gait abnormality 03/06/2020  . Memory loss 02/12/2020  . Candiduria   . Memory difficulties     . Fall   . Acute metabolic encephalopathy 17/40/8144  . Lower urinary tract infectious disease   . Urinary retention due to benign prostatic hyperplasia 09/28/2017  . Acute urinary retention 09/12/2017  . Sepsis ([place]) 09/12/2017  . Abdominal pain 09/12/2017  . Essential hypertension   . Diabetes mellitus without complication ([place])   . GERD (gastroesophageal reflux disease)     Past Surgical History:  Procedure Laterality Date  . CATARACT EXTRACTION W/ INTRAOCULAR LENS  IMPLANT, BILATERAL  03/2017  . EXICISION EPIDERMAL CYST LEFT THUMB  09-12-2001   dr sypher  [facility]  . INGUINAL HERNIA REPAIR Right 11-07-2007   dr Dalbert [facility]  . KNEE ARTHROSCOPY Right 1990s  . MIDDLE EAR SURGERY     cyst removal  . SQUAMOUS CELL CARCINOMA EXCISION    . TRANSURETHRAL RESECTION OF PROSTATE  07-26-2000  dr Amalia Ha[facility]  . TRANSURETHRAL RESECTION OF PROSTATE N/A 09/28/2017   Procedure: BIPOLAR TRANSURETHRAL RESECTION OF THE PROSTATE (TURP);  Surgeon: Lucas Mallow, MD;  Location: [facility];  Service: Urology;  Laterality: N/A;  . VENTRICULOPERITONEAL SHUNT Right 04/08/2020   Procedure: Shunt Placment - right occipital;  Surgeon: Earnie Larsson, MD;  Location: [facility];  Service: Neurosurgery;  Laterality: Right;  Family History  Problem Relation Age of Onset  . Pancreatic cancer Mother   . Melanoma Sister   . Lung cancer Brother   . Cancer Father        unsure of type    Social History   Tobacco Use  . Smoking status: Former Smoker    Years: 1.00    Types: Cigarettes    Quit date: 09/27/1967    Years since quitting: 52.7  . Smokeless tobacco: Never Used  Vaping Use  . Vaping Use: Never used  Substance Use Topics  . Alcohol use: Not Currently  . Drug use: No    Home Medications Prior to Admission medications   Medication Sig Start Date End Date Taking? Authorizing Provider  lisinopril (PRINIVIL,ZESTRIL) 40 MG tablet Take 40 mg by mouth daily.    Yes [provider]  metFORMIN (GLUCOPHAGE) 500 MG tablet Take 500-1,000 mg by mouth See admin instructions. Take 1000 mg in the morning and 500 mg in the evening   Yes [provider]  pantoprazole (PROTONIX) 20 MG tablet Take 20 mg 2 (two) times daily by mouth.    Yes [provider]  trospium (SANCTURA) 20 MG tablet Take 20 mg by mouth at bedtime.  02/07/20  Yes [provider]  Vitamin D, Ergocalciferol, (DRISDOL) 1.25 MG (50000 UNIT) CAPS capsule Take 50,000 Units by mouth every 7 (seven) days. Wednesday   Yes [provider]    Allergies    Patient has no known allergies.  Review of Systems   Review of Systems  Constitutional: Positive for fatigue. Negative for fever.  HENT: Negative for sore throat.   Eyes: Negative for visual disturbance.  Respiratory: Negative for cough and shortness of breath.   Cardiovascular: Negative for chest pain.  Gastrointestinal: Negative for abdominal pain.  Genitourinary: Negative for difficulty urinating.  Musculoskeletal: Negative for back pain and neck stiffness.  Skin: Negative for rash.  Neurological: Positive for weakness (generalized). Negative for syncope and headaches.    Physical Exam Updated Vital Signs BP (!) 195/87   Pulse 100   Temp 97.9 F (36.6 C) (Oral)   Resp 16   Ht 5\' 8"  (1.727 m)   Wt 59 kg   SpO2 95%   BMI 19.78 kg/m   Physical Exam Vitals and nursing note reviewed.  Constitutional:      General: He is not in acute distress.    Appearance: He is well-developed. He is not diaphoretic.  HENT:     Head: Normocephalic and atraumatic.  Eyes:     Conjunctiva/sclera: Conjunctivae normal.  Cardiovascular:     Rate and Rhythm: Normal rate and regular rhythm.     Heart sounds: Normal heart sounds. No murmur heard.  No friction rub. No gallop.   Pulmonary:     Effort: Pulmonary effort is normal. No respiratory distress.     Breath sounds: Normal breath sounds. No wheezing or rales.    Abdominal:     General: There is no distension.     Palpations: Abdomen is soft.     Tenderness: There is no abdominal tenderness. There is no guarding.  Musculoskeletal:     Cervical back: Normal range of motion.  Skin:    General: Skin is warm and dry.  Neurological:     Mental Status: He is alert.     GCS: GCS eye subscore is 4. GCS verbal subscore is 5. GCS motor subscore is 6.     Comments: Unable to  ambulate, leaning significantly on staff just to stand up Strength appears equal bilaterally upper and lower extremities, no facial droop Does not have gaze preference. Reports sensation same bilaterally, does have difficulty with L vs R     ED Results / Procedures / Treatments   Labs (all labs ordered are listed, but only abnormal results are displayed) Labs Reviewed  CBC WITH DIFFERENTIAL/PLATELET - Abnormal; Notable for the following components:      Result Value   RBC 3.95 (*)    MCV 100.5 (*)    All other components within normal limits  COMPREHENSIVE METABOLIC PANEL - Abnormal; Notable for the following components:   Glucose, Bld 119 (*)    Calcium 10.7 (*)    Total Protein 5.6 (*)    Albumin 2.7 (*)    AST 12 (*)    GFR calc non Af Amer 60 (*)    All other components within normal limits  LACTIC ACID, PLASMA - Abnormal; Notable for the following components:   Lactic Acid, Venous 2.0 (*)    All other components within normal limits  CULTURE, BLOOD (ROUTINE X 2)  CULTURE, BLOOD (ROUTINE X 2)  URINE CULTURE  SARS CORONAVIRUS 2 BY RT PCR (HOSPITAL ORDER, [place] [facility])  LACTIC ACID, PLASMA  URINALYSIS, ROUTINE W REFLEX MICROSCOPIC    EKG EKG Interpretation  Date/Time:  Thursday June 12 2020 12:57:13 EDT Ventricular Rate:  103 PR Interval:    QRS Duration: 88 QT Interval:  341 QTC Calculation: 447 R Axis:   -56 Text Interpretation: Sinus tachycardia Ventricular bigeminy RSR' in V1 or V2, probably normal variant Inferior infarct, old  Since prior ECG< rate has increased slightly, no other significant changes Confirmed by Gareth Morgan [phone]) on 06/12/2020 3:24:41 PM   Radiology CT Head Wo Contrast  Result Date: 06/12/2020 CLINICAL DATA:  Altered mental status. EXAM: CT HEAD WITHOUT CONTRAST TECHNIQUE: Contiguous axial images were obtained from the base of the skull through the vertex without intravenous contrast. COMPARISON:  April 23, 2020. FINDINGS: Brain: Mild diffuse cortical atrophy is noted. Mild chronic ischemic white matter disease is noted. Stable position of right ventriculostomy catheter tip seen just to the right of midline and right lateral ventricle. Lateral ventricles are unchanged in size, as they appear slightly dilated, but this most likely is due to surrounding atrophy. No midline shift is noted. No hemorrhage, mass lesion or acute infarction is noted. Vascular: No hyperdense vessel or unexpected calcification. Skull: Right posterior parietal ventriculostomy is noted. No acute abnormality is seen in the skull. Sinuses/Orbits: No acute finding. Other: None. IMPRESSION: Mild diffuse cortical atrophy. Mild chronic ischemic white matter disease. Stable position of right ventriculostomy catheter. Lateral ventricles are unchanged in size compared to prior exam, as they appear slightly dilated, but this most likely is due to surrounding atrophy. No acute intracranial abnormality seen. Electronically Signed   By: Marijo Conception M.D.   On: 06/12/2020 14:45   DG Chest Portable 1 View  Result Date: 06/12/2020 CLINICAL DATA:  Shortness of breath EXAM: PORTABLE CHEST 1 VIEW COMPARISON:  04/23/2020 FINDINGS: Probable mild atelectasis/scarring at the lung bases. No significant pleural effusion. No pneumothorax. Heart size is normal. IMPRESSION: Mild atelectasis/scarring at the lung bases. Electronically Signed   By: Macy Mis M.D.   On: 06/12/2020 14:13    Procedures Procedures (including critical care  time)  Medications Ordered in ED Medications  sodium chloride 0.9 % bolus 1,000 mL (0 mLs Intravenous Stopped 06/12/20  1600)    ED Course  I have reviewed the triage vital signs and the nursing notes.  Pertinent labs & imaging results that were available during my care of the patient were reviewed by me and considered in my medical decision making (see chart for details).    MDM Rules/Calculators/A&P                          86yo male with history of hydrocephalus status post right-sided VP shunt placement Apr 08, 2020 with Dr. Annette Stable, type 2 diabetes, hypertension, hyperlipidemia, presents with concern for generalized weakness developing over the last 2 days.    Differential diagnosis includes anemia, electrolyte abnormality, cardiac abnormality, infection, hypothyroidism, other toxic/metabolic abnormalities, CVA, shunt malfunction.  CT head without acute findings.   Pt hemodynamically stable, afebrile, without history of infectious symptoms. Doubt shunt infection. No sign of pneumonia. Does have tachycardia which is mild, possible dehydration but consider possible infectious etiology such as UTI. Urine pending at this time.   CBC showed no sign of anemia.  Electrolytes without significant cahgnes.  EKG was not changed from prior, patient does not have chest pain  and have low suspicion for cardiac etiology of symptoms. Consider PE given mild tachycardia, however given no dyspnea or CP will evaluate UA first as etiology.    No signs of focal weakness or numbness on exam and lower suspicion overall for CVA although it remains on differential.  Anticipate likely admission given acute change from baseline, inability to ambulate on my evaluation.         Final Clinical Impression(s) / ED Diagnoses Final diagnoses:  Altered mental status, unspecified altered mental status type    Rx / DC Orders ED Discharge Orders    None       Gareth Morgan, MD 06/12/20 1610

## 2020-06-12 NOTE — H&P (Addendum)
History and Physical    HARM JOU BPZ:[id number] DOB: 08-04-1929 DOA: 06/12/2020  PCP: [name], MD  Patient coming from: Home I have personally briefly reviewed patient's old medical records in [facility]  Chief Complaint: Generalized weakness  HPI: Robert Gross is a 86 y.o. male with medical history significant of hypertension, type 2 diabetes mellitus, GERD, urinary retention, urge incontinence, BPH, arthritis, dementia, gait instability, recurrent urinary tract infection, communicating hydrocephalus status post VP shunt placement brought by EMS to emergency department due to generalized weakness since 2 days.  Patient is poor historian due to underlying dementia.  Patient's wife at bedside is the historian.  She tells me that patient has been extremely weak since 2 days, could not stand up, sleepy and has increased urinary frequency/incontinence since 2 days.  He has history of recurrent urinary tract infection and his mental status always changes especially when he has underlying UTI.  As per wife: at baseline- Patient talks but does not make any sense.  No headache, blurry vision, seizure, loss of consciousness, head trauma, dysuria, foul-smelling urine, fever, chills, nausea, vomiting, diarrhea, chest pain, shortness of breath, palpitation, leg swelling, sleep or bowel changes.  He lives with his wife at home.  She uses cane/walker for ambulation.  Denies history of smoking, alcohol, illicit drug use.  ED Course: Upon arrival to ED: Patient tachycardic, blood pressure elevated, afebrile with no leukocytosis, CMP shows calcium of 10.7, lactic acid: 2.0, COVID-19 negative, UA positive for bacteria and leukocyte.  Blood culture: No growth.  Chest x-ray shows mild atelectasis/scarring at the lung bases.  CT head shows mild diffuse cortical atrophy.  Chronic ischemic white matter disease.  Stable position of right ventriculostomy catheter.  Lateral ventricles are unchanged  in size compared to prior exam.  No acute intracranial abnormality seen.  Patient received IV fluids and Rocephin in ED.  [facility] hospitalist consulted for admission for generalized weakness secondary to underlying UTI.  Review of Systems: As per HPI otherwise negative.    Past Medical History:  Diagnosis Date  . Arthritis   . BPH (benign prostatic hyperplasia)   . BPH with urinary obstruction   . Cataracts, bilateral   . Dizzy spells    residual from concussion 09-05-2017 intermittantly when turns head but stated as of 09-26-2017 no issues for past 2-3 days  . Essential hypertension   . Gait abnormality   . GERD (gastroesophageal reflux disease)   . History of concussion    09-05-2017  w/ loc --- per pt residual intermittant dizziness when he turns his head either way  . History of prostatitis   . History of sepsis    09-12-2017  urosepsis  . History of urinary retention   . Hydrocephalus ([place])   . Hypercalcemia   . Hyperlipemia   . Memory loss   . Metabolic encephalopathy   . Osteopenia   . Squamous cell carcinoma in situ    multiple spots  . Type 2 diabetes mellitus ([place])   . Urinary retention    requires intermittent caths  . Vitamin D deficiency   . Vocal cord paralysis   . Wears partial dentures    upper    Past Surgical History:  Procedure Laterality Date  . CATARACT EXTRACTION W/ INTRAOCULAR LENS  IMPLANT, BILATERAL  03/2017  . EXICISION EPIDERMAL CYST LEFT THUMB  09-12-2001   dr sypher  [facility]  . INGUINAL HERNIA REPAIR Right 11-07-2007   dr Dalbert Batman [facility]  . KNEE ARTHROSCOPY  Right 1990s  . MIDDLE EAR SURGERY     cyst removal  . SQUAMOUS CELL CARCINOMA EXCISION    . TRANSURETHRAL RESECTION OF PROSTATE  07-26-2000  dr Amalia Hailey [facility]  . TRANSURETHRAL RESECTION OF PROSTATE N/A 09/28/2017   Procedure: BIPOLAR TRANSURETHRAL RESECTION OF THE PROSTATE (TURP);  Surgeon: Lucas Mallow, MD;  Location: [facility];  Service: Urology;  Laterality: N/A;  .  VENTRICULOPERITONEAL SHUNT Right 04/08/2020   Procedure: Shunt Placment - right occipital;  Surgeon: Earnie Larsson, MD;  Location: [facility];  Service: Neurosurgery;  Laterality: Right;     reports that he quit smoking about 52 years ago. His smoking use included cigarettes. He quit after 1.00 year of use. He has never used smokeless tobacco. He reports previous alcohol use. He reports that he does not use drugs.  No Known Allergies  Family History  Problem Relation Age of Onset  . Pancreatic cancer Mother   . Melanoma Sister   . Lung cancer Brother   . Cancer Father        unsure of type    Prior to Admission medications   Medication Sig Start Date End Date Taking? Authorizing Provider  lisinopril (PRINIVIL,ZESTRIL) 40 MG tablet Take 40 mg by mouth daily.    Yes [provider]  metFORMIN (GLUCOPHAGE) 500 MG tablet Take 500-1,000 mg by mouth See admin instructions. Take 1000 mg in the morning and 500 mg in the evening   Yes [provider]  pantoprazole (PROTONIX) 20 MG tablet Take 20 mg 2 (two) times daily by mouth.    Yes [provider]  trospium (SANCTURA) 20 MG tablet Take 20 mg by mouth at bedtime.  02/07/20  Yes [provider]  Vitamin D, Ergocalciferol, (DRISDOL) 1.25 MG (50000 UNIT) CAPS capsule Take 50,000 Units by mouth every 7 (seven) days. Wednesday   Yes [provider]    Physical Exam: Vitals:   06/12/20 1300 06/12/20 1330 06/12/20 1400 06/12/20 1519  BP: (!) 157/76 (!) 153/81 (!) 168/82 (!) 195/87  Pulse: 100 98 102 100  Resp: 20 22 20 16   Temp:      TempSrc:      SpO2: 94% 94% 94% 95%  Weight:      Height:        Constitutional: NAD, calm, comfortable, on room air, appears dehydrated, alert however not oriented to time place and person. Talks but does not make any sense. Eyes: PERRL, lids and conjunctivae normal ENMT: Mucous membranes are dry. Posterior pharynx clear of any exudate or lesions.Normal dentition.  Neck:  normal, supple, no masses, no thyromegaly Respiratory: clear to auscultation bilaterally, no wheezing, no crackles. Normal respiratory effort. No accessory muscle use.  Cardiovascular: Tachycardic, soft systolic murmur/ rubs / gallops. No extremity edema. 2+ pedal pulses. No carotid bruits.  Abdomen: no tenderness, no masses palpated. No hepatosplenomegaly. Bowel sounds positive.  Musculoskeletal: no clubbing / cyanosis. No joint deformity upper and lower extremities. Good ROM, no contractures. Normal muscle tone.  Skin: no rashes, lesions, ulcers. No induration Neurologic: Alert, following commands, moving all extremities.   Labs on Admission: I have personally reviewed following labs and imaging studies  CBC: Recent Labs  Lab 06/12/20 1309  WBC 9.1  NEUTROABS 6.8  HGB 13.1  HCT 39.7  MCV 100.5*  PLT 254   Basic Metabolic Panel: Recent Labs  Lab 06/12/20 1309  NA 138  K 3.9  CL 105  CO2 24  GLUCOSE 119*  BUN  20  CREATININE 1.09  CALCIUM 10.7*   GFR: Estimated Creatinine Clearance: 38.3 mL/min (by C-G formula based on SCr of 1.09 mg/dL). Liver Function Tests: Recent Labs  Lab 06/12/20 1309  AST 12*  ALT 15  ALKPHOS 87  BILITOT 0.8  PROT 5.6*  ALBUMIN 2.7*   No results for input(s): LIPASE, AMYLASE in the last 168 hours. No results for input(s): AMMONIA in the last 168 hours. Coagulation Profile: No results for input(s): INR, PROTIME in the last 168 hours. Cardiac Enzymes: No results for input(s): CKTOTAL, CKMB, CKMBINDEX, TROPONINI in the last 168 hours. BNP (last 3 results) No results for input(s): PROBNP in the last 8760 hours. HbA1C: No results for input(s): HGBA1C in the last 72 hours. CBG: No results for input(s): GLUCAP in the last 168 hours. Lipid Profile: No results for input(s): CHOL, HDL, LDLCALC, TRIG, CHOLHDL, LDLDIRECT in the last 72 hours. Thyroid Function Tests: No results for input(s): TSH, T4TOTAL, FREET4, T3FREE, THYROIDAB in the last  72 hours. Anemia Panel: No results for input(s): VITAMINB12, FOLATE, FERRITIN, TIBC, IRON, RETICCTPCT in the last 72 hours. Urine analysis:    Component Value Date/Time   COLORURINE YELLOW 06/12/2020 1644   APPEARANCEUR HAZY (A) 06/12/2020 1644   LABSPEC 1.013 06/12/2020 1644   PHURINE 6.0 06/12/2020 1644   GLUCOSEU NEGATIVE 06/12/2020 1644   HGBUR NEGATIVE 06/12/2020 1644   BILIRUBINUR NEGATIVE 06/12/2020 1644   KETONESUR 5 (A) 06/12/2020 1644   PROTEINUR NEGATIVE 06/12/2020 1644   UROBILINOGEN 0.2 11/03/2007 2123   NITRITE NEGATIVE 06/12/2020 1644   LEUKOCYTESUR LARGE (A) 06/12/2020 1644    Radiological Exams on Admission: CT Head Wo Contrast  Result Date: 06/12/2020 CLINICAL DATA:  Altered mental status. EXAM: CT HEAD WITHOUT CONTRAST TECHNIQUE: Contiguous axial images were obtained from the base of the skull through the vertex without intravenous contrast. COMPARISON:  April 23, 2020. FINDINGS: Brain: Mild diffuse cortical atrophy is noted. Mild chronic ischemic white matter disease is noted. Stable position of right ventriculostomy catheter tip seen just to the right of midline and right lateral ventricle. Lateral ventricles are unchanged in size, as they appear slightly dilated, but this most likely is due to surrounding atrophy. No midline shift is noted. No hemorrhage, mass lesion or acute infarction is noted. Vascular: No hyperdense vessel or unexpected calcification. Skull: Right posterior parietal ventriculostomy is noted. No acute abnormality is seen in the skull. Sinuses/Orbits: No acute finding. Other: None. IMPRESSION: Mild diffuse cortical atrophy. Mild chronic ischemic white matter disease. Stable position of right ventriculostomy catheter. Lateral ventricles are unchanged in size compared to prior exam, as they appear slightly dilated, but this most likely is due to surrounding atrophy. No acute intracranial abnormality seen. Electronically Signed   By: Marijo Conception M.D.    On: 06/12/2020 14:45   DG Chest Portable 1 View  Result Date: 06/12/2020 CLINICAL DATA:  Shortness of breath EXAM: PORTABLE CHEST 1 VIEW COMPARISON:  04/23/2020 FINDINGS: Probable mild atelectasis/scarring at the lung bases. No significant pleural effusion. No pneumothorax. Heart size is normal. IMPRESSION: Mild atelectasis/scarring at the lung bases. Electronically Signed   By: Macy Mis M.D.   On: 06/12/2020 14:13    EKG: Independently reviewed.  Sinus tachycardia, ventricular bigeminy, no ST elevation or depression noted.  Assessment/Plan Principal Problem:   Generalized weakness Active Problems:   Essential hypertension   Diabetes mellitus without complication (HCC)   GERD (gastroesophageal reflux disease)   UTI (urinary tract infection)   Communicating hydrocephalus ([place])  Hyperlipemia   Hypercalcemia   Generalized weakness likely secondary to underlying UTI: -Patient has history of recurrent urinary tract infection.  Reports increased urinary frequency and incontinence.  UA is positive for leukocytes, WBC, bacteria.  Patient is tachycardic however afebrile with no leukocytosis.  Lactic acid: 2.0. -Reviewed chest x-ray and CT head.  COVID-19 negative.  Urine culture and blood culture: Pending. -Admit patient on the floor. -Continue IV fluids and IV Rocephin-de-escalate antibiotics based on urine culture result. -Consult PT/OT -On fall precautions  Dehydration: UA is positive for ketones.  Patient appears dehydrated on exam.  Lactic acid: 2.0.  Continue IV fluids  Communicating hydrocephalus: Status post VP shunt placement.  Reviewed CT head-no acute findings.  Hypertension: Blood pressure elevated upon arrival -Continue home lisinopril.  Monitor blood pressure closely  Diabetes mellitus: Type II: Check A1c -Hold Metformin and start patient on sliding scale insulin.  Macrocytosis: MCV: 100.5 -Check B12, folate level.  Hypercalcemia: Calcium of 10.7 -Corrected  calcium for albumin is 11.7. -Continue IV fluids.  Hold home vitamin D supplements  GERD: Continue PPI  DVT prophylaxis: Lovenox/SCD Code Status: Full code-confirmed with patient's wife Family Communication: Patient's wife present at bedside.  Plan of care discussed with patient in length and he verbalized understanding and agreed with it. Disposition Plan: Likely home in 1 to 2 days  consults called: None Admission status: Inpatient   Mckinley Jewel MD [facility]  If 7PM-7AM, please contact night-coverage [email or web address] Password [facility]  06/12/2020, 5:52 PM

## 2020-06-12 NOTE — ED Notes (Signed)
Patient transported to CT 

## 2020-06-12 NOTE — ED Notes (Signed)
This NT and MD attempted to walk patient in the room. Patient was unsteady on their feet and unable to take a step upon standing.

## 2020-06-12 NOTE — ED Triage Notes (Addendum)
Pt BIB EMS from home. x3 days pt has had increasing AMS. EMS arrival GCS 11, pt unable to speak. On arrival to ED pt alert, oriented to self. Pt reports frequent UTIs.

## 2020-06-13 DIAGNOSIS — E119 Type 2 diabetes mellitus without complications: Secondary | ICD-10-CM

## 2020-06-13 DIAGNOSIS — N390 Urinary tract infection, site not specified: Principal | ICD-10-CM

## 2020-06-13 DIAGNOSIS — I10 Essential (primary) hypertension: Secondary | ICD-10-CM

## 2020-06-13 DIAGNOSIS — G910 Communicating hydrocephalus: Secondary | ICD-10-CM

## 2020-06-13 LAB — CBG MONITORING, ED
Glucose-Capillary: 118 mg/dL — ABNORMAL HIGH (ref 70–99)
Glucose-Capillary: 137 mg/dL — ABNORMAL HIGH (ref 70–99)

## 2020-06-13 LAB — CBC
HCT: 40.2 % (ref 39.0–52.0)
Hemoglobin: 13.2 g/dL (ref 13.0–17.0)
MCH: 32.4 pg (ref 26.0–34.0)
MCHC: 32.8 g/dL (ref 30.0–36.0)
MCV: 98.8 fL (ref 80.0–100.0)
Platelets: 184 10*3/uL (ref 150–400)
RBC: 4.07 MIL/uL — ABNORMAL LOW (ref 4.22–5.81)
RDW: 12.9 % (ref 11.5–15.5)
WBC: 8.4 10*3/uL (ref 4.0–10.5)
nRBC: 0 % (ref 0.0–0.2)

## 2020-06-13 LAB — COMPREHENSIVE METABOLIC PANEL
ALT: 13 U/L (ref 0–44)
AST: 17 U/L (ref 15–41)
Albumin: 2.7 g/dL — ABNORMAL LOW (ref 3.5–5.0)
Alkaline Phosphatase: 93 U/L (ref 38–126)
Anion gap: 11 (ref 5–15)
BUN: 14 mg/dL (ref 8–23)
CO2: 23 mmol/L (ref 22–32)
Calcium: 10.7 mg/dL — ABNORMAL HIGH (ref 8.9–10.3)
Chloride: 105 mmol/L (ref 98–111)
Creatinine, Ser: 0.83 mg/dL (ref 0.61–1.24)
GFR calc Af Amer: >60 mL/min (ref >60)
GFR calc non Af Amer: >60 mL/min (ref >60)
Glucose, Bld: 117 mg/dL — ABNORMAL HIGH (ref 70–99)
Potassium: 3.5 mmol/L (ref 3.5–5.1)
Sodium: 139 mmol/L (ref 135–145)
Total Bilirubin: 1.1 mg/dL (ref 0.3–1.2)
Total Protein: 5.7 g/dL — ABNORMAL LOW (ref 6.5–8.1)

## 2020-06-13 LAB — URINE CULTURE

## 2020-06-13 LAB — GLUCOSE, CAPILLARY: Glucose-Capillary: 116 mg/dL — ABNORMAL HIGH (ref 70–99)

## 2020-06-13 MED ORDER — MAGNESIUM SULFATE 4 GM/100ML IV SOLN
4.0000 g | Freq: Once | INTRAVENOUS | Status: AC
  Administered 2020-06-13: 4 g via INTRAVENOUS
  Filled 2020-06-13: qty 100

## 2020-06-13 MED ORDER — HYDRALAZINE HCL 25 MG PO TABS
25.0000 mg | ORAL_TABLET | Freq: Four times a day (QID) | ORAL | Status: DC | PRN
  Administered 2020-06-13 – 2020-06-16 (×4): 25 mg via ORAL
  Filled 2020-06-13 (×4): qty 1

## 2020-06-13 MED ORDER — LORAZEPAM 2 MG/ML IJ SOLN
0.5000 mg | Freq: Once | INTRAMUSCULAR | Status: AC
  Administered 2020-06-13: 0.5 mg via INTRAVENOUS
  Filled 2020-06-13: qty 1

## 2020-06-13 MED ORDER — POTASSIUM CHLORIDE CRYS ER 20 MEQ PO TBCR
40.0000 meq | EXTENDED_RELEASE_TABLET | Freq: Once | ORAL | Status: AC
  Administered 2020-06-13: 40 meq via ORAL
  Filled 2020-06-13: qty 2

## 2020-06-13 NOTE — Progress Notes (Signed)
Called by RN. Pt is agitated and trying to get out of bed and pulling on IV lines. RN placed mittens and requests something to help decrease agitation.  Ordered 0.5 mg ativan IV one time now. Advised RN that pt needs a Air cabin crew in room to prevent him from getting out of bed and falling/injuring himself.

## 2020-06-13 NOTE — Progress Notes (Signed)
[facility]  PROGRESS NOTE  Robert Gross ZTI:[id number] DOB: 1929/08/10 DOA: 06/12/2020 PCP: [name], MD   Brief HPI:   86 year old male with medical history of hypertension, diabetes mellitus type 2, GERD, urine retention, urge incontinence, BPH, arthritis, dementia, gait instability, recurrent UTIs, communicating hydrocephalus status post VP shunt placement was brought by EMS for generalized weakness for past 2 days. In the ED was found to have abnormal UA, started on IV ceftriaxone.   Subjective   This morning patient is alert, denies any chest pain or shortness of breath. Magnesium was 0.9 yesterday.   Assessment/Plan:     1. Generalized weakness-multifactorial from electrolyte abnormalities and UTI. Patient's magnesium is 0.9, will replace magnesium 4 g mag sulfate IV x1. Patient has been started on IV ceftriaxone for UTI. Follow urine culture results. Will obtain PT/OT evaluation. 2. Communicating hydrocephalus-patient has VP shunt in place in May 2021, CT head showed no acute findings. 3. Hypertension-continue lisinopril, blood pressure mildly related. Start hydralazine 25 mg p.o. every 6 hours as needed for blood pressure greater than 160/100. 4. Diabetes mellitus type 2-continue sliding scale insulin with NovoLog. CBG well controlled. 5. Hypercalcemia-patient is on home vitamin D supplements which is currently on hold. Corrected calcium is 11.7. Patient started on IV fluids. Follow calcium level in a.m.   Scheduled medications:    darifenacin  7.5 mg Oral Daily   enoxaparin (LOVENOX) injection  40 mg Subcutaneous Q24H   insulin aspart  0-15 Units Subcutaneous TID WC   insulin aspart  0-5 Units Subcutaneous QHS   lisinopril  40 mg Oral Daily   pantoprazole  20 mg Oral BID   potassium chloride  40 mEq Oral Once      CBG: Recent Labs  Lab 06/12/20 2118 06/13/20 0749  GLUCAP 148* 118*    SpO2: 98 % O2 Flow Rate (L/min): (S) 2 L/min     CBC: Recent Labs  Lab 06/12/20 1309 06/12/20 1844 06/13/20 0207  WBC 9.1 8.3 8.4  NEUTROABS 6.8  --   --   HGB 13.1 10.4* 13.2  HCT 39.7 32.3* 40.2  MCV 100.5* 100.3* 98.8  PLT 203 191 825    Basic Metabolic Panel: Recent Labs  Lab 06/12/20 1309 06/12/20 1844 06/13/20 0207  NA 138  --  139  K 3.9  --  3.5  CL 105  --  105  CO2 24  --  23  GLUCOSE 119*  --  117*  BUN 20  --  14  CREATININE 1.09 0.93 0.83  CALCIUM 10.7*  --  10.7*  MG  --  0.9*  --      Liver Function Tests: Recent Labs  Lab 06/12/20 1309 06/13/20 0207  AST 12* 17  ALT 15 13  ALKPHOS 87 93  BILITOT 0.8 1.1  PROT 5.6* 5.7*  ALBUMIN 2.7* 2.7*     Antibiotics: Anti-infectives (From admission, onward)   Start     Dose/Rate Route Frequency Ordered Stop   06/13/20 1700  cefTRIAXone (ROCEPHIN) 1 g in sodium chloride 0.9 % 100 mL IVPB     Discontinue     1 g 200 mL/hr over 30 Minutes Intravenous Every 24 hours 06/12/20 1823     06/12/20 1745  cefTRIAXone (ROCEPHIN) 1 g in sodium chloride 0.9 % 100 mL IVPB        1 g 200 mL/hr over 30 Minutes Intravenous  Once 06/12/20 1733 06/12/20 1810       DVT prophylaxis: Lovenox  Code Status: Full code  Family Communication: Spoke with patient's daughter on phone    Status is: Inpatient  Dispo: The patient is from: Home              Anticipated d/c is to: Home versus skilled nursing facility              Anticipated d/c date is: 06/16/2020              Patient currently not medically stable for discharge. Getting IV antibiotics, electrolytes being replaced.  Barrier to discharge-IV antibiotics for UTI, electrolyte replacement          Consultants:    Procedures:     Objective   Vitals:   06/13/20 0430 06/13/20 0500 06/13/20 0530 06/13/20 0600  BP: (!) 149/83 (!) 123/100 (!) 140/119 (!) 149/70  Pulse: 88 92 102 90  Resp: 22 18 22 17   Temp:      TempSrc:      SpO2: 100% 100% 100% 98%  Weight:      Height:         Intake/Output Summary (Last 24 hours) at 06/13/2020 1210 Last data filed at 06/12/2020 1810 Gross per 24 hour  Intake 1100 ml  Output --  Net 1100 ml    07/21 1901 - 07/23 0700 In: 1100  Out: -   Filed Weights   06/12/20 1208  Weight: 59 kg    Physical Examination:    General: Appears in no acute distress  Cardiovascular: S1-S2, regular, no murmur auscultated  Respiratory: Clear to auscultation bilaterally, no wheezing or crackles auscultated  Abdomen: Abdomen is soft, nontender, no organomegaly  Extremities: No edema in the lower extremities  Neurologic: Alert, oriented to self, follows commands    Data Reviewed:   Recent Results (from the past 240 hour(s))  Blood culture (routine x 2)     Status: None (Preliminary result)   Collection Time: 06/12/20  3:10 PM   Specimen: BLOOD  Result Value Ref Range Status   Specimen Description BLOOD RIGHT ANTECUBITAL  Final   Special Requests   Final    BOTTLES DRAWN AEROBIC AND ANAEROBIC Blood Culture adequate volume   Culture   Final    NO GROWTH < 24 HOURS Performed at [facility], [address] [place], [place] [address]    Report Status PENDING  Incomplete  Blood culture (routine x 2)     Status: None (Preliminary result)   Collection Time: 06/12/20  3:10 PM   Specimen: BLOOD  Result Value Ref Range Status   Specimen Description BLOOD RIGHT ANTECUBITAL  Final   Special Requests   Final    BOTTLES DRAWN AEROBIC AND ANAEROBIC Blood Culture adequate volume   Culture   Final    NO GROWTH < 24 HOURS Performed at [facility], [place] [address] [place], [place] [address]    Report Status PENDING  Incomplete  SARS Coronavirus 2 by RT PCR (hospital order, performed in [facility] hospital lab) Nasopharyngeal Nasopharyngeal Swab     Status: None   Collection Time: 06/12/20  3:58 PM   Specimen: Nasopharyngeal Swab  Result Value Ref Range Status   SARS Coronavirus 2 NEGATIVE NEGATIVE Final    Comment:  (NOTE) SARS-CoV-2 target nucleic acids are NOT DETECTED.  The SARS-CoV-2 RNA is generally detectable in upper and lower respiratory specimens during the acute phase of infection. The lowest concentration of SARS-CoV-2 viral copies this assay can detect is 250 copies /  mL. A negative result does not preclude SARS-CoV-2 infection and should not be used as the sole basis for treatment or other patient management decisions.  A negative result may occur with improper specimen collection / handling, submission of specimen other than nasopharyngeal swab, presence of viral mutation(s) within the areas targeted by this assay, and inadequate number of viral copies (<250 copies / mL). A negative result must be combined with clinical observations, patient history, and epidemiological information.  Fact Sheet for Patients:   StrictlyIdeas.no  Fact Sheet for Healthcare Providers: BankingDealers.co.za  This test is not yet approved or  cleared by the [place] FDA and has been authorized for detection and/or diagnosis of SARS-CoV-2 by FDA under an Emergency Use Authorization (EUA).  This EUA will remain in effect (meaning this test can be used) for the duration of the COVID-19 declaration under Section 564(b)(1) of the Act, 21 U.S.C. section 360bbb-3(b)(1), unless the authorization is terminated or revoked sooner.  Performed at [facility], [place] [address], [place], [place] [address]      Studies:  CT Head Wo Contrast  Result Date: 06/12/2020 CLINICAL DATA:  Altered mental status. EXAM: CT HEAD WITHOUT CONTRAST TECHNIQUE: Contiguous axial images were obtained from the base of the skull through the vertex without intravenous contrast. COMPARISON:  April 23, 2020. FINDINGS: Brain: Mild diffuse cortical atrophy is noted. Mild chronic ischemic white matter disease is noted. Stable position of right ventriculostomy catheter tip seen just to the  right of midline and right lateral ventricle. Lateral ventricles are unchanged in size, as they appear slightly dilated, but this most likely is due to surrounding atrophy. No midline shift is noted. No hemorrhage, mass lesion or acute infarction is noted. Vascular: No hyperdense vessel or unexpected calcification. Skull: Right posterior parietal ventriculostomy is noted. No acute abnormality is seen in the skull. Sinuses/Orbits: No acute finding. Other: None. IMPRESSION: Mild diffuse cortical atrophy. Mild chronic ischemic white matter disease. Stable position of right ventriculostomy catheter. Lateral ventricles are unchanged in size compared to prior exam, as they appear slightly dilated, but this most likely is due to surrounding atrophy. No acute intracranial abnormality seen. Electronically Signed   By: Marijo Conception M.D.   On: 06/12/2020 14:45   DG Chest Portable 1 View  Result Date: 06/12/2020 CLINICAL DATA:  Shortness of breath EXAM: PORTABLE CHEST 1 VIEW COMPARISON:  04/23/2020 FINDINGS: Probable mild atelectasis/scarring at the lung bases. No significant pleural effusion. No pneumothorax. Heart size is normal. IMPRESSION: Mild atelectasis/scarring at the lung bases. Electronically Signed   By: Macy Mis M.D.   On: 06/12/2020 14:13       [place]   [facility] If 7PM-7AM, please contact night-coverage at [email or web address], Office  [phone]   06/13/2020, 12:10 PM  LOS: 1 day

## 2020-06-13 NOTE — ED Notes (Signed)
Pt observed trying to get out of bed. When asked what he was doing, pt stated, "Going home I guess". Pt informed that he is being admitted and we are waiting on a bed, offered to contact daughter for him. Pt assisted to get back in bed. Changed bed linens, brief, and reconnected to monitoring. Warm blanket provided and pt is resting comfortably.

## 2020-06-13 NOTE — Evaluation (Signed)
Physical Therapy Evaluation Patient Details Name: Robert Gross MRN: [id number] DOB: Jul 25, 1929 Today's Date: 06/13/2020   History of Present Illness  86yo male with increased weakness of 2 days found to have UTI and electrolyte imbalances. PMH PBH, B cataracts, hx concussion, HTN, gait abnormality, hydrocephalus s/p VP shunt, HLD, memory loss/dementia, metabolic encephalopathy, DM, vocal cord paralysis, R knee arthroscopy  Clinical Impression   Patient received in bed, pleasantly confused and A&O to self only; spouse present and provided all prior history and PLOF information. Needed heavy levels of physical assistance for all mobility today due to poor safety awareness, poor sequencing, and heavy posterior lean. Very weak and often drifting posteriorly when sitting at EOB but able to somewhat correct when cued, otherwise needed Min-ModA for sitting balance and MaxAx2 for standing balance today. Left positioned to comfort on ED stretcher with all needs met, spouse present. Will definitely need skilled PT follow-up in post acute rehab prior to return home.     Follow Up Recommendations SNF;Supervision/Assistance - 24 hour    Equipment Recommendations  Other (comment) (defer to next venue)    Recommendations for Other Services       Precautions / Restrictions Precautions Precautions: Fall Restrictions Weight Bearing Restrictions: No      Mobility  Bed Mobility Overal bed mobility: Needs Assistance Bed Mobility: Supine to Sit;Sit to Supine     Supine to sit: Mod assist;+2 for physical assistance Sit to supine: Max assist;+2 for physical assistance   General bed mobility comments: ModAx2 to get to EOB, Min-ModA to maitnain sitting balance, then MaxAx2 to return to supine due to issues with sequencing  Transfers Overall transfer level: Needs assistance Equipment used: 2 person hand held assist Transfers: Sit to/from Stand Sit to Stand: Max assist;+2 physical assistance          General transfer comment: MaxAx2 to come to standing then strong posterior lean requiring ongoing assist of 2 people to maintain upright  Ambulation/Gait             General Gait Details: deferred, safety  Stairs            Wheelchair Mobility    Modified Rankin (Stroke Patients Only)       Balance Overall balance assessment: Needs assistance;History of Falls Sitting-balance support: Bilateral upper extremity supported;Feet supported Sitting balance-Leahy Scale: Poor Sitting balance - Comments: MinA-ModA to maintain balance at EOB Postural control: Posterior lean Standing balance support: Bilateral upper extremity supported;During functional activity Standing balance-Leahy Scale: Poor Standing balance comment: MaxAx2 to maintain upright due to strong posterior lean                             Pertinent Vitals/Pain Pain Assessment: No/denies pain    Home Living Family/patient expects to be discharged to:: Private residence Living Arrangements: Spouse/significant other Available Help at Discharge: Family;Available 24 hours/day Type of Home: House Home Access: Ramped entrance     Home Layout: One level Home Equipment: Cane - single point;Walker - 2 wheels;Walker - 4 wheels;Shower seat Additional Comments: uses 2 wheeled walker in the house, uses 4WW outside    Prior Function Level of Independence: Independent with assistive device(s)         Comments: Has been using RW at home for stability. Wife helps him get dressed but he can bathe himself     Hand Dominance   Dominant Hand: Left    Extremity/Trunk Assessment   Upper Extremity  Assessment Upper Extremity Assessment: Defer to OT evaluation    Lower Extremity Assessment Lower Extremity Assessment: Generalized weakness    Cervical / Trunk Assessment Cervical / Trunk Assessment: Kyphotic  Communication   Communication: HOH  Cognition Arousal/Alertness: Awake/alert Behavior  During Therapy: Flat affect Overall Cognitive Status: Impaired/Different from baseline Area of Impairment: Orientation;Attention;Memory;Following commands;Safety/judgement;Awareness;Problem solving                 Orientation Level: Disoriented to;Place;Time;Situation (knew his name but not his birthday) Current Attention Level: Sustained Memory: Decreased recall of precautions;Decreased short-term memory Following Commands: Follows one step commands inconsistently;Follows one step commands with increased time Safety/Judgement: Decreased awareness of safety;Decreased awareness of deficits Awareness: Intellectual Problem Solving: Slow processing;Requires verbal cues;Decreased initiation;Difficulty sequencing;Requires tactile cues General Comments: A&Ox1, slow processing and mixed levels of command following- but pleasantly confused and laughing during session. States "you're crazy!" to therapist.      General Comments General comments (skin integrity, edema, etc.): spouse observed session, reports she is not able to physically lift patient    Exercises     Assessment/Plan    PT Assessment Patient needs continued PT services  PT Problem List Decreased strength;Decreased cognition;Decreased activity tolerance;Decreased safety awareness;Decreased balance;Decreased knowledge of precautions;Decreased mobility;Decreased coordination       PT Treatment Interventions DME instruction;Balance training;Gait training;Neuromuscular re-education;Cognitive remediation;Functional mobility training;Patient/family education;Therapeutic activities;Therapeutic exercise    PT Goals (Current goals can be found in the Care Plan section)  Acute Rehab PT Goals Patient Stated Goal: get him stronger PT Goal Formulation: With family Time For Goal Achievement: 06/27/20 Potential to Achieve Goals: Fair    Frequency Min 2X/week   Barriers to discharge Decreased caregiver support spouse cannot  physically lift him    Co-evaluation               AM-PAC PT "6 Clicks" Mobility  Outcome Measure Help needed turning from your back to your side while in a flat bed without using bedrails?: A Little Help needed moving from lying on your back to sitting on the side of a flat bed without using bedrails?: A Lot Help needed moving to and from a bed to a chair (including a wheelchair)?: Total Help needed standing up from a chair using your arms (e.g., wheelchair or bedside chair)?: Total Help needed to walk in hospital room?: Total Help needed climbing 3-5 steps with a railing? : Total 6 Click Score: 9    End of Session Equipment Utilized During Treatment: Gait belt Activity Tolerance: Patient tolerated treatment well Patient left: in bed;with call bell/phone within reach;with family/visitor present (ED stretcher)   PT Visit Diagnosis: Unsteadiness on feet (R26.81);Muscle weakness (generalized) (M62.81);History of falling (Z91.81);Difficulty in walking, not elsewhere classified (R26.2)    Time: 8502-7741 PT Time Calculation (min) (ACUTE ONLY): 21 min   Charges:   PT Evaluation $PT Eval Moderate Complexity: 1 Mod     Windell [place], DPT, PN1   Supplemental Physical Therapist [place]    Pager [phone] Acute Rehab Office [phone]

## 2020-06-13 NOTE — ED Notes (Signed)
Tele  Breakfast Ordered 

## 2020-06-14 DIAGNOSIS — R4182 Altered mental status, unspecified: Secondary | ICD-10-CM

## 2020-06-14 LAB — GLUCOSE, CAPILLARY
Glucose-Capillary: 132 mg/dL — ABNORMAL HIGH (ref 70–99)
Glucose-Capillary: 92 mg/dL (ref 70–99)
Glucose-Capillary: 156 mg/dL — ABNORMAL HIGH (ref 70–99)
Glucose-Capillary: 120 mg/dL — ABNORMAL HIGH (ref 70–99)

## 2020-06-14 LAB — COMPREHENSIVE METABOLIC PANEL
ALT: 15 U/L (ref 0–44)
AST: 16 U/L (ref 15–41)
Albumin: 2.5 g/dL — ABNORMAL LOW (ref 3.5–5.0)
Alkaline Phosphatase: 93 U/L (ref 38–126)
Anion gap: 8 (ref 5–15)
BUN: 11 mg/dL (ref 8–23)
CO2: 23 mmol/L (ref 22–32)
Calcium: 9.3 mg/dL (ref 8.9–10.3)
Chloride: 112 mmol/L — ABNORMAL HIGH (ref 98–111)
Creatinine, Ser: 0.83 mg/dL (ref 0.61–1.24)
GFR calc Af Amer: >60 mL/min (ref >60)
GFR calc non Af Amer: >60 mL/min (ref >60)
Glucose, Bld: 132 mg/dL — ABNORMAL HIGH (ref 70–99)
Potassium: 3.7 mmol/L (ref 3.5–5.1)
Sodium: 143 mmol/L (ref 135–145)
Total Bilirubin: 0.9 mg/dL (ref 0.3–1.2)
Total Protein: 5.4 g/dL — ABNORMAL LOW (ref 6.5–8.1)

## 2020-06-14 LAB — MAGNESIUM: Magnesium: 1.7 mg/dL (ref 1.7–2.4)

## 2020-06-14 MED ORDER — AMLODIPINE BESYLATE 5 MG PO TABS
5.0000 mg | ORAL_TABLET | Freq: Every day | ORAL | Status: DC
  Administered 2020-06-14 – 2020-06-17 (×4): 5 mg via ORAL
  Filled 2020-06-14 (×4): qty 1

## 2020-06-14 MED ORDER — LORAZEPAM 2 MG/ML IJ SOLN: 0.5000 mg | INTRAMUSCULAR | Status: DC | PRN

## 2020-06-14 MED ORDER — MAGNESIUM OXIDE 400 (241.3 MG) MG PO TABS
400.0000 mg | ORAL_TABLET | Freq: Every day | ORAL | Status: AC
  Administered 2020-06-14 – 2020-06-16 (×3): 400 mg via ORAL
  Filled 2020-06-14 (×3): qty 1

## 2020-06-14 NOTE — Progress Notes (Signed)
[facility]  PROGRESS NOTE  Robert Gross HQI:[id number] DOB: Jan 18, 1929 DOA: 06/12/2020 PCP: [name], MD   Brief HPI:   86 year old male with medical history of hypertension, diabetes mellitus type 2, GERD, urine retention, urge incontinence, BPH, arthritis, dementia, gait instability, recurrent UTIs, communicating hydrocephalus status post VP shunt placement was brought by EMS for generalized weakness for past 2 days. In the ED was found to have abnormal UA, started on IV ceftriaxone.   Subjective   Patient seen and examined, became agitated this morning.  Urine culture growing multiple species.   Assessment/Plan:     1. Generalized weakness-multifactorial from electrolyte abnormalities and UTI. Patient's magnesium was 0.9, magnesium was replaced and today magnesium is 1.7 .  We will start magnesium oxide 400 mg daily for 3 days.  Will discontinue IV antibiotics as urine culture growing multiple species.  PT recommends skilled nursing facility placement.  Will get [facility] consult for skilled nursing facility placement.   2. Communicating hydrocephalus-patient has VP shunt in place in May 2021, CT head showed no acute findings. 3. History of dementia-patient has behavior disturbance, received Ativan this morning.  We will continue with Ativan 0.5 mg every 4 hours as needed. 4. Hypertension-continue lisinopril, blood pressure mildly related. Start hydralazine 25 mg p.o. every 6 hours as needed for blood pressure greater than 160/100.  We will start amlodipine 5 mg daily. 5. Diabetes mellitus type 2-continue sliding scale insulin with NovoLog. CBG well controlled. 6. Hypercalcemia-patient is on home vitamin D supplements which is currently on hold. Corrected calcium is down to 10.5.  IV fluids have been discontinued due to mild fluid overload.   Scheduled medications:   . darifenacin  7.5 mg Oral Daily  . enoxaparin (LOVENOX) injection  40 mg Subcutaneous Q24H  . insulin  aspart  0-15 Units Subcutaneous TID WC  . insulin aspart  0-5 Units Subcutaneous QHS  . lisinopril  40 mg Oral Daily  . magnesium oxide  400 mg Oral Daily  . pantoprazole  20 mg Oral BID      CBG: Recent Labs  Lab 06/13/20 0749 06/13/20 1213 06/13/20 1711 06/14/20 0612 06/14/20 1228  GLUCAP 118* 137* 116* 132* 92    SpO2: 100 % O2 Flow Rate (L/min): (S) 2 L/min    CBC: Recent Labs  Lab 06/12/20 1309 06/12/20 1844 06/13/20 0207  WBC 9.1 8.3 8.4  NEUTROABS 6.8  --   --   HGB 13.1 10.4* 13.2  HCT 39.7 32.3* 40.2  MCV 100.5* 100.3* 98.8  PLT 203 191 132    Basic Metabolic Panel: Recent Labs  Lab 06/12/20 1309 06/12/20 1844 06/13/20 0207 06/14/20 0228  NA 138  --  139 143  K 3.9  --  3.5 3.7  CL 105  --  105 112*  CO2 24  --  23 23  GLUCOSE 119*  --  117* 132*  BUN 20  --  14 11  CREATININE 1.09 0.93 0.83 0.83  CALCIUM 10.7*  --  10.7* 9.3  MG  --  0.9*  --  1.7     Liver Function Tests: Recent Labs  Lab 06/12/20 1309 06/13/20 0207 06/14/20 0228  AST 12* 17 16  ALT 15 13 15   ALKPHOS 87 93 93  BILITOT 0.8 1.1 0.9  PROT 5.6* 5.7* 5.4*  ALBUMIN 2.7* 2.7* 2.5*     Antibiotics: Anti-infectives (From admission, onward)   Start     Dose/Rate Route Frequency Ordered Stop   06/13/20 1700  cefTRIAXone (ROCEPHIN) 1 g in sodium chloride 0.9 % 100 mL IVPB     Discontinue     1 g 200 mL/hr over 30 Minutes Intravenous Every 24 hours 06/12/20 1823     06/12/20 1745  cefTRIAXone (ROCEPHIN) 1 g in sodium chloride 0.9 % 100 mL IVPB        1 g 200 mL/hr over 30 Minutes Intravenous  Once 06/12/20 1733 06/12/20 1810       DVT prophylaxis: Lovenox  Code Status: Full code  Family Communication: Spoke with patient's daughter on phone    Status is: Inpatient  Dispo: The patient is from: Home              Anticipated d/c is to: Home versus skilled nursing facility              Anticipated d/c date is: 06/16/2020              Patient currently not  medically stable for discharge.  Barrier to discharge-       Objective   Vitals:   06/14/20 0332 06/14/20 0332 06/14/20 0736 06/14/20 1225  BP: (!) 167/90 (!) 167/90 (!) 177/82 (!) 158/80  Pulse: 92 92 96 83  Resp: 18 20 20 18   Temp: 98.1 F (36.7 C) 98.1 F (36.7 C) 99.6 F (37.6 C) 98.6 F (37 C)  TempSrc: Axillary Oral Oral Oral  SpO2: 92%  96% 100%  Weight:      Height:        Intake/Output Summary (Last 24 hours) at 06/14/2020 1440 Last data filed at 06/14/2020 0531 Gross per 24 hour  Intake 3105.52 ml  Output 476 ml  Net 2629.52 ml    07/22 1901 - 07/24 0700 In: 3105.5 [I.V.:3005.5] Out: 476 [Urine:476]  Filed Weights   06/12/20 1208  Weight: 59 kg    Physical Examination:    General: Appears in no acute distress  Cardiovascular: S1-S2, regular, no murmur auscultated  Respiratory: Clear to auscultation bilaterally, no wheezing or crackles auscultated  Abdomen: Abdomen is soft, nontender, no organomegaly  Extremities: No edema in the lower extremities  Neurologic: Alert, oriented to self, follows commands    Data Reviewed:   Recent Results (from the past 240 hour(s))  Blood culture (routine x 2)     Status: None (Preliminary result)   Collection Time: 06/12/20  3:10 PM   Specimen: BLOOD  Result Value Ref Range Status   Specimen Description BLOOD RIGHT ANTECUBITAL  Final   Special Requests   Final    BOTTLES DRAWN AEROBIC AND ANAEROBIC Blood Culture adequate volume   Culture   Final    NO GROWTH 2 DAYS Performed at [facility], [address], [place], [place] [address]    Report Status PENDING  Incomplete  Blood culture (routine x 2)     Status: None (Preliminary result)   Collection Time: 06/12/20  3:10 PM   Specimen: BLOOD  Result Value Ref Range Status   Specimen Description BLOOD RIGHT ANTECUBITAL  Final   Special Requests   Final    BOTTLES DRAWN AEROBIC AND ANAEROBIC Blood Culture adequate volume   Culture   Final     NO GROWTH 2 DAYS Performed at [facility], [place] [address], [place], [place] [address]    Report Status PENDING  Incomplete  SARS Coronavirus 2 by RT PCR (hospital order, performed in [facility] hospital lab) Nasopharyngeal Nasopharyngeal Swab     Status: None  Collection Time: 06/12/20  3:58 PM   Specimen: Nasopharyngeal Swab  Result Value Ref Range Status   SARS Coronavirus 2 NEGATIVE NEGATIVE Final    Comment: (NOTE) SARS-CoV-2 target nucleic acids are NOT DETECTED.  The SARS-CoV-2 RNA is generally detectable in upper and lower respiratory specimens during the acute phase of infection. The lowest concentration of SARS-CoV-2 viral copies this assay can detect is 250 copies / mL. A negative result does not preclude SARS-CoV-2 infection and should not be used as the sole basis for treatment or other patient management decisions.  A negative result may occur with improper specimen collection / handling, submission of specimen other than nasopharyngeal swab, presence of viral mutation(s) within the areas targeted by this assay, and inadequate number of viral copies (<250 copies / mL). A negative result must be combined with clinical observations, patient history, and epidemiological information.  Fact Sheet for Patients:   StrictlyIdeas.no  Fact Sheet for Healthcare Providers: BankingDealers.co.za  This test is not yet approved or  cleared by the [place] FDA and has been authorized for detection and/or diagnosis of SARS-CoV-2 by FDA under an Emergency Use Authorization (EUA).  This EUA will remain in effect (meaning this test can be used) for the duration of the COVID-19 declaration under Section 564(b)(1) of the Act, 21 U.S.C. section 360bbb-3(b)(1), unless the authorization is terminated or revoked sooner.  Performed at [facility], [place] [address] [place], [place] [address]   Urine culture     Status:  Abnormal   Collection Time: 06/12/20  4:42 PM   Specimen: Urine, Catheterized  Result Value Ref Range Status   Specimen Description URINE, CATHETERIZED  Final   Special Requests   Final    NONE Performed at [facility], [place] [address] [place], [place] [address]    Culture MULTIPLE SPECIES PRESENT, SUGGEST RECOLLECTION (A)  Final   Report Status 06/13/2020 FINAL  Final     Oswald Hillock   [facility] If 7PM-7AM, please contact night-coverage at [email or web address], Office  [phone]   06/14/2020, 2:40 PM  LOS: 2 days

## 2020-06-14 NOTE — Plan of Care (Signed)
  Problem: Activity: Goal: Risk for activity intolerance will decrease Outcome: Progressing   Problem: Nutrition: Goal: Adequate nutrition will be maintained Outcome: Progressing   Problem: Pain Managment: Goal: General experience of comfort will improve Outcome: Progressing   

## 2020-06-14 NOTE — Progress Notes (Signed)
Occupational Therapy Evaluation Patient Details Name: Robert Gross MRN: [id number] DOB: [birth year] Today's Date: 06/14/2020    History of Present Illness 86 yo male with increased weakness of 2 days found to have UTI and electrolyte imbalances. PMH PBH, B cataracts, hx concussion, HTN, gait abnormality, hydrocephalus s/p VP shunt, HLD, memory loss/dementia, metabolic encephalopathy, DM, vocal cord paralysis, R knee arthroscopy   Clinical Impression   Pt admitted for above and limited by decreased cognition, balance, activity tolerance, coordination, strength/ROM. According to wife at bedside, pt required supervision for functional mobility using RW/rollator, supervision for showering, mod-max assist for dressing, mod assist for toileting, and performed grooming/eating/feeding with Mod I. Pt is currently functioning below baseline in terms of ADL function and functional mobility. Today, pt received supine in bed, wife and CNA present, pt agreeable to OT eval. Pt requires max assist x2 to transition to sitting EOB. Once seated EOB, pt required min-mod assist for trunk support secondary to L lateral and posterior lean. Pt experiencing inconsistent R inattention with L gaze preference, requiring verbal cues for looking midline and R. BUEs equally weak. Pt requires max assist for self-care activities sitting EOB. Unable to assess functional transfers and OOB ADLs at this time secondary to fatigue, weakness, and inattention. Pt would benefit from continued acute skilled OT services to address ADLs and ADL mobility to improve independence. Wife unable to provide physical assist to pt at this time, recommending SNF with 24/7 before returning home. Will continue to follow acutely as able.     Follow Up Recommendations  SNF;Supervision/Assistance - 24 hour    Equipment Recommendations  Other (comment) (defer to next venue of care)    Recommendations for Other Services Speech consult     Precautions /  Restrictions Precautions Precautions: Fall Restrictions Weight Bearing Restrictions: No      Mobility Bed Mobility Overal bed mobility: Needs Assistance Bed Mobility: Supine to Sit;Sit to Supine     Supine to sit: Max assist;+2 for physical assistance;HOB elevated Sit to supine: Max assist;+2 for physical assistance   General bed mobility comments: max assist x2 to transition from supine>EOB and back to supine with verbal cues for using hand railings  Transfers   General transfer comment: did not attempt sit>stand today secondary to strong L lateral lean/posterior lean sitting EOB, lethargy, and inattention to 1-step commands    Balance Overall balance assessment: Needs assistance;History of Falls Sitting-balance support: Bilateral upper extremity supported;Feet supported Sitting balance-Leahy Scale: Poor Sitting balance - Comments: MinA-ModA to maintain balance at EOB Postural control: Posterior lean;Left lateral lean     ADL either performed or assessed with clinical judgement   ADL Overall ADL's : Needs assistance/impaired Eating/Feeding: Moderate assistance;Maximal assistance   Grooming: Wash/dry face;Oral care;Maximal assistance;Sitting Grooming Details (indicate cue type and reason): pt sat at EOB to brush teeth requiring max assist for manipulating grooming materials and min-mod assist for trunk support secondary to L lateral lean Upper Body Bathing: Maximal assistance;Bed level   Lower Body Bathing: Maximal assistance;Bed level   Upper Body Dressing : Maximal assistance   Lower Body Dressing: Total assistance   Toilet Transfer:  (not assessed secondary to deficits in sitting balance at EOB)   Toileting- Clothing Manipulation and Hygiene: Total assistance;Bed level   Tub/ Shower Transfer:  (not assessed)     General ADL Comments: max assist at [facility] using v/c's for bed mobility and transitioning to EOB; while sitting EOB to brush teeth required min-mod  assist for trunk support secondary to  L lateral and posterior lean     Vision Baseline Vision/History: No visual deficits Patient Visual Report: No change from baseline Vision Assessment?: Yes Eye Alignment: Within Functional Limits (able to follow up and down but difficulty L and R ) Ocular Range of Motion: Within Functional Limits Alignment/Gaze Preference: Gaze left Tracking/Visual Pursuits: Impaired - to be further tested in functional context (pt able to track pen up and down but not L and R laterally ) Additional Comments: pt had difficulty understanding 1-step commands (e.g. follow my pen with just your eyes); pt either gazed L or fixated forward and didn't respond to directions            Pertinent Vitals/Pain Pain Assessment: No/denies pain     Hand Dominance Right   Extremity/Trunk Assessment Upper Extremity Assessment Upper Extremity Assessment: Generalized weakness   Lower Extremity Assessment Lower Extremity Assessment: Defer to PT evaluation   Cervical / Trunk Assessment Cervical / Trunk Assessment: Kyphotic   Communication Communication Communication: HOH   Cognition Arousal/Alertness: Awake/alert;Lethargic Behavior During Therapy: Flat affect Overall Cognitive Status: Impaired/Different from baseline Area of Impairment: Orientation;Attention;Memory;Following commands    Orientation Level: Time;Situation (unable to state president, month, year; able to state city) Current Attention Level: Selective Memory: Decreased short-term memory Following Commands: Follows one step commands inconsistently Safety/Judgement: Decreased awareness of safety;Decreased awareness of deficits   Problem Solving: Slow processing;Requires verbal cues;Decreased initiation;Difficulty sequencing;Requires tactile cues General Comments: A&Ox1, slow processing and mixed levels of command following- but pleasantly confused and laughing during session. Unable to recognize wife this  morning which is not typical.   General Comments  wife present at bedside- reports that pt has needed increased assistance with functional mobility and self-care tasks over the last few months; reports that pt does not speak much anymore which is unusual; reports having to call neighbors multiple times to lift pt off floor if slides off bed or chair      Home Living Family/patient expects to be discharged to:: Private residence Living Arrangements: Spouse/significant other Available Help at Discharge: Family;Available 24 hours/day Type of Home: House Home Access: Ramped entrance     Home Layout: One level     Bathroom Shower/Tub: Walk-in shower;Door   Bathroom Toilet: Handicapped height (BSC placed over toilet) Bathroom Accessibility: Yes How Accessible: Accessible via walker Home Equipment: [place] - 2 wheels;Walker - 4 wheels;Cane - single point;Shower seat   Additional Comments: uses 2 wheeled walker in the house, uses 4WW outside      Prior Functioning/Environment Level of Independence: Independent with assistive device(s);Needs assistance  Gait / Transfers Assistance Needed: last few months requires supervision for functional mobility using RW/rollator ADL's / Homemaking Assistance Needed: last few months requires supervison for bathing, mod-max for dressing, mod for toileting hygiene, and Mod I for feeding/eating/grooming Communication / Swallowing Assistance Needed: minimal- responds most to yes/no and simple questions Comments: wife reports that he has required increased assistance with mobility and self-care over the last few months; pt does not verbalize much anymore; pt reports decrease in function whenever pt experiences UTI        OT Problem List: Decreased strength;Decreased range of motion;Decreased activity tolerance;Impaired balance (sitting and/or standing);Decreased coordination;Decreased cognition;Decreased safety awareness      OT Treatment/Interventions:  Self-care/ADL training;Therapeutic exercise;Neuromuscular education;Energy conservation;DME and/or AE instruction;Therapeutic activities;Cognitive remediation/compensation;Visual/perceptual remediation/compensation;Patient/family education;Balance training    OT Goals(Current goals can be found in the care plan section) Acute Rehab OT Goals Patient Stated Goal: get him stronger OT Goal Formulation: With patient/family  Time For Goal Achievement: 06/28/20 Potential to Achieve Goals: Fair  OT Frequency: Min 2X/week   Barriers to D/C: Decreased caregiver support  wife is unable to physically assist pt    AM-PAC OT "6 Clicks" Daily Activity     Outcome Measure Help from another person eating meals?: A Lot Help from another person taking care of personal grooming?: A Lot Help from another person toileting, which includes using toliet, bedpan, or urinal?: Total Help from another person bathing (including washing, rinsing, drying)?: A Lot Help from another person to put on and taking off regular upper body clothing?: A Lot Help from another person to put on and taking off regular lower body clothing?: Total 6 Click Score: 10   End of Session Nurse Communication: Mobility status  Activity Tolerance: Patient limited by lethargy;Patient limited by fatigue Patient left: in bed;with call bell/phone within reach;with bed alarm set;with nursing/sitter in room;with family/visitor present  OT Visit Diagnosis: Muscle weakness (generalized) (M62.81);History of falling (Z91.81);Unsteadiness on feet (R26.81)                Time: 1010-1108 OT Time Calculation (min): 58 min Charges:  OT General Charges $OT Visit: 1 Visit OT Evaluation $OT Eval Moderate Complexity: 1 Mod OT Treatments $Self Care/Home Management : 23-37 mins  Michel Bickers, OTR/L [facility] [phone]   Francesca Jewett 06/14/2020, 4:23 PM

## 2020-06-15 LAB — COMPREHENSIVE METABOLIC PANEL
ALT: 17 U/L (ref 0–44)
AST: 16 U/L (ref 15–41)
Albumin: 2.4 g/dL — ABNORMAL LOW (ref 3.5–5.0)
Alkaline Phosphatase: 93 U/L (ref 38–126)
Anion gap: 7 (ref 5–15)
BUN: 11 mg/dL (ref 8–23)
CO2: 20 mmol/L — ABNORMAL LOW (ref 22–32)
Calcium: 8.9 mg/dL (ref 8.9–10.3)
Chloride: 111 mmol/L (ref 98–111)
Creatinine, Ser: 0.75 mg/dL (ref 0.61–1.24)
GFR calc Af Amer: >60 mL/min (ref >60)
GFR calc non Af Amer: >60 mL/min (ref >60)
Glucose, Bld: 137 mg/dL — ABNORMAL HIGH (ref 70–99)
Potassium: 3.4 mmol/L — ABNORMAL LOW (ref 3.5–5.1)
Sodium: 138 mmol/L (ref 135–145)
Total Bilirubin: 1.1 mg/dL (ref 0.3–1.2)
Total Protein: 5 g/dL — ABNORMAL LOW (ref 6.5–8.1)

## 2020-06-15 LAB — GLUCOSE, CAPILLARY
Glucose-Capillary: 133 mg/dL — ABNORMAL HIGH (ref 70–99)
Glucose-Capillary: 163 mg/dL — ABNORMAL HIGH (ref 70–99)
Glucose-Capillary: 97 mg/dL (ref 70–99)
Glucose-Capillary: 134 mg/dL — ABNORMAL HIGH (ref 70–99)

## 2020-06-15 LAB — URINE CULTURE: Culture: 20000 — AB

## 2020-06-15 MED ORDER — POTASSIUM CHLORIDE CRYS ER 20 MEQ PO TBCR
40.0000 meq | EXTENDED_RELEASE_TABLET | Freq: Once | ORAL | Status: AC
  Administered 2020-06-15: 40 meq via ORAL
  Filled 2020-06-15: qty 2

## 2020-06-15 NOTE — NC FL2 (Signed)
[place] MEDICAID FL2 LEVEL OF CARE SCREENING TOOL     IDENTIFICATION  Patient Name: Robert Gross Birthdate: 06-27-1929 Sex: male Admission Date (Current Location): 06/12/2020  [facility] and [place] Number:  [employer] and Address:  [facility] [place], [place] [address] [place], [place] [address]      Provider Number: [id number]  Attending Physician Name and Address:  Oswald Hillock, MD  Relative Name and Phone Number:  Donat Humble    Current Level of Care: Hospital Recommended Level of Care: [place] Prior Approval Number:    Date Approved/Denied:   PASRR Number: [id number]  Discharge Plan: SNF    Current Diagnoses: Patient Active Problem List   Diagnosis Date Noted  . Hyperlipemia   . Generalized weakness   . Hypercalcemia   . Communicating hydrocephalus ([place]) 04/08/2020  . Gait abnormality 03/06/2020  . Memory loss 02/12/2020  . Candiduria   . Memory difficulties   . Fall   . Acute metabolic encephalopathy 29/51/8841  . UTI (urinary tract infection)   . Urinary retention due to benign prostatic hyperplasia 09/28/2017  . Acute urinary retention 09/12/2017  . Sepsis ([place]) 09/12/2017  . Abdominal pain 09/12/2017  . Essential hypertension   . Diabetes mellitus without complication ([place])   . GERD (gastroesophageal reflux disease)     Orientation RESPIRATION BLADDER Height & Weight     Self  Normal Incontinent, External catheter Weight: 149 lb (67.6 kg) Height:  5\' 8"  (172.7 cm)  BEHAVIORAL SYMPTOMS/MOOD NEUROLOGICAL BOWEL NUTRITION STATUS      Continent Diet (See discharge summary)  AMBULATORY STATUS COMMUNICATION OF NEEDS Skin   Extensive Assist Verbally Normal                       Personal Care Assistance Level of Assistance  Bathing, Feeding, Dressing Bathing Assistance: Maximum assistance   Dressing Assistance: Maximum assistance     Functional Limitations Info  Sight, Hearing, Speech Sight  Info: Adequate Hearing Info: Adequate Speech Info: Adequate    SPECIAL CARE FACTORS FREQUENCY  PT (By licensed PT), OT (By licensed OT)     PT Frequency: 5x a week OT Frequency: 5x a week            Contractures Contractures Info: Not present    Additional Factors Info  Code Status, Allergies Code Status Info: Full Allergies Info: NKA           Current Medications (06/15/2020):  This is the current hospital active medication list Current Facility-Administered Medications  Medication Dose Route Frequency Provider Last Rate Last Admin  . 0.9 %  sodium chloride infusion   Intravenous Continuous Oswald Hillock, MD 10 mL/hr at 06/15/20 0659 Rate Verify at 06/15/20 0659  . acetaminophen (TYLENOL) tablet 650 mg  650 mg Oral Q6H PRN Pahwani, Rinka R, MD       Or  . acetaminophen (TYLENOL) suppository 650 mg  650 mg Rectal Q6H PRN Pahwani, Rinka R, MD      . amLODipine (NORVASC) tablet 5 mg  5 mg Oral Daily Oswald Hillock, MD   5 mg at 06/14/20 1604  . darifenacin (ENABLEX) 24 hr tablet 7.5 mg  7.5 mg Oral Daily Pahwani, Rinka R, MD   7.5 mg at 06/14/20 0905  . enoxaparin (LOVENOX) injection 40 mg  40 mg Subcutaneous Q24H Pahwani, Rinka R, MD   40 mg at 06/14/20 1738  . hydrALAZINE (APRESOLINE) tablet 25 mg  25 mg Oral Q6H PRN Oswald Hillock, MD   25 mg at 06/14/20 2054  . insulin aspart (novoLOG) injection 0-15 Units  0-15 Units Subcutaneous TID WC Pahwani, Rinka R, MD   2 Units at 06/15/20 0732  . insulin aspart (novoLOG) injection 0-5 Units  0-5 Units Subcutaneous QHS Pahwani, Rinka R, MD      . lisinopril (ZESTRIL) tablet 40 mg  40 mg Oral Daily Pahwani, Rinka R, MD   40 mg at 06/14/20 0905  . LORazepam (ATIVAN) injection 0.5 mg  0.5 mg Intravenous Q4H PRN Oswald Hillock, MD      . magnesium oxide (MAG-OX) tablet 400 mg  400 mg Oral Daily Oswald Hillock, MD   400 mg at 06/14/20 1603  . ondansetron (ZOFRAN) tablet 4 mg  4 mg Oral Q6H PRN Pahwani, Rinka R, MD       Or  . ondansetron  (ZOFRAN) injection 4 mg  4 mg Intravenous Q6H PRN Pahwani, Rinka R, MD      . pantoprazole (PROTONIX) EC tablet 20 mg  20 mg Oral BID Pahwani, Rinka R, MD   20 mg at 06/14/20 2054     Discharge Medications: Please see discharge summary for a list of discharge medications.  Relevant Imaging Results:  Relevant Lab Results:   Additional Information SSN [id number]  Neysa Hotter [place], [place]

## 2020-06-15 NOTE — Progress Notes (Signed)
[facility]  PROGRESS NOTE  MICKAL MENO BWG:[id number] DOB: 05-09-1929 DOA: 06/12/2020 PCP: [name], MD   Brief HPI:   86 year old male with medical history of hypertension, diabetes mellitus type 2, GERD, urine retention, urge incontinence, BPH, arthritis, dementia, gait instability, recurrent UTIs, communicating hydrocephalus status post VP shunt placement was brought by EMS for generalized weakness for past 2 days. In the ED was found to have abnormal UA, started on IV ceftriaxone.   Subjective   Patient seen and examined, looks better than yesterday.  Denies any complaints.  Antibiotics were discontinued yesterday as urine culture grew multiple species.  Daughter and wife at bedside.   Assessment/Plan:     1. Generalized weakness-multifactorial from electrolyte abnormalities and UTI. Patient's magnesium was 0.9, magnesium was replaced and repeat magnesium is 1.7 .  Started on magnesium oxide 400 mg daily for 3 days.  IV antibiotics have been discontinued.   PT recommends skilled nursing facility placement.   TOC consulted for skilled nursing facility placement.   2. Communicating hydrocephalus-patient has VP shunt in place in May 2021, CT head showed no acute findings. 3. History of dementia-patient has behavior disturbance, received Ativan this morning.  We will continue with Ativan 0.5 mg every 4 hours as needed. 4. Hypertension-continue lisinopril, was started on 5 mg amlodipine yesterday.  Continue hydralazine 25 mg p.o. every 6 hours as needed for blood pressure greater than 160/100.  5. Diabetes mellitus type 2-continue sliding scale insulin with NovoLog. CBG well controlled. 6. Hypercalcemia-patient is on home vitamin D supplements which is currently on hold. Corrected calcium is down to 10.5.  IV fluids have been discontinued due to mild fluid overload. 7. Hypokalemia-potassium 3.4, will replace potassium and follow BMP in a.m.   Scheduled medications:     amLODipine  5 mg Oral Daily   darifenacin  7.5 mg Oral Daily   enoxaparin (LOVENOX) injection  40 mg Subcutaneous Q24H   insulin aspart  0-15 Units Subcutaneous TID WC   insulin aspart  0-5 Units Subcutaneous QHS   lisinopril  40 mg Oral Daily   magnesium oxide  400 mg Oral Daily   pantoprazole  20 mg Oral BID      CBG: Recent Labs  Lab 06/14/20 1228 06/14/20 1732 06/14/20 2130 06/15/20 0609 06/15/20 1214  GLUCAP 92 156* 120* 133* 163*    SpO2: 94 % O2 Flow Rate (L/min): (S) 2 L/min    CBC: Recent Labs  Lab 06/12/20 1309 06/12/20 1844 06/13/20 0207  WBC 9.1 8.3 8.4  NEUTROABS 6.8  --   --   HGB 13.1 10.4* 13.2  HCT 39.7 32.3* 40.2  MCV 100.5* 100.3* 98.8  PLT 203 191 177    Basic Metabolic Panel: Recent Labs  Lab 06/12/20 1309 06/12/20 1844 06/13/20 0207 06/14/20 0228 06/15/20 0422  NA 138  --  139 143 138  K 3.9  --  3.5 3.7 3.4*  CL 105  --  105 112* 111  CO2 24  --  23 23 20*  GLUCOSE 119*  --  117* 132* 137*  BUN 20  --  14 11 11   CREATININE 1.09 0.93 0.83 0.83 0.75  CALCIUM 10.7*  --  10.7* 9.3 8.9  MG  --  0.9*  --  1.7  --      Liver Function Tests: Recent Labs  Lab 06/12/20 1309 06/13/20 0207 06/14/20 0228 06/15/20 0422  AST 12* 17 16 16   ALT 15 13 15 17   ALKPHOS 87  93 93 93  BILITOT 0.8 1.1 0.9 1.1  PROT 5.6* 5.7* 5.4* 5.0*  ALBUMIN 2.7* 2.7* 2.5* 2.4*     Antibiotics: Anti-infectives (From admission, onward)   Start     Dose/Rate Route Frequency Ordered Stop   06/13/20 1700  cefTRIAXone (ROCEPHIN) 1 g in sodium chloride 0.9 % 100 mL IVPB  Status:  Discontinued        1 g 200 mL/hr over 30 Minutes Intravenous Every 24 hours 06/12/20 1823 06/14/20 1446   06/12/20 1745  cefTRIAXone (ROCEPHIN) 1 g in sodium chloride 0.9 % 100 mL IVPB        1 g 200 mL/hr over 30 Minutes Intravenous  Once 06/12/20 1733 06/12/20 1810       DVT prophylaxis: Lovenox  Code Status: Full code  Family Communication: Discussed with  patient's wife and daughter at bedside.    Status is: Inpatient  Dispo: The patient is from: Home              Anticipated d/c is to: Home versus skilled nursing facility              Anticipated d/c date is: 06/16/2020              Patient currently medically stable for discharge  Barrier to discharge-awaiting placement in skilled nursing facility.      Objective   Vitals:   06/15/20 0300 06/15/20 0500 06/15/20 0736 06/15/20 1218  BP: (!) 160/88  (!) 138/60 (!) 151/68  Pulse: 88  (!) 107 94  Resp: 20  18 18   Temp: 98.2 F (36.8 C)  99.1 F (37.3 C) 98.4 F (36.9 C)  TempSrc: Oral  Oral Oral  SpO2: 94%  95% 94%  Weight:  67.6 kg    Height:        Intake/Output Summary (Last 24 hours) at 06/15/2020 1408 Last data filed at 06/15/2020 1308 Gross per 24 hour  Intake 953.22 ml  Output 400 ml  Net 553.22 ml    07/23 1901 - 07/25 0700 In: 3698.7 [I.V.:3598.7] Out: 811 [Urine:875]  Filed Weights   06/12/20 1208 06/15/20 0500  Weight: 59 kg 67.6 kg    Physical Examination:    General-appears in no acute distress  Heart-S1-S2, regular, no murmur auscultated  Lungs-clear to auscultation bilaterally, no wheezing or crackles auscultated  Abdomen-soft, nontender, no organomegaly  Extremities-no edema in the lower extremities  Neuro-alert, oriented x3, no focal deficit noted    Data Reviewed:   Recent Results (from the past 240 hour(s))  Blood culture (routine x 2)     Status: None (Preliminary result)   Collection Time: 06/12/20  3:10 PM   Specimen: BLOOD  Result Value Ref Range Status   Specimen Description BLOOD RIGHT ANTECUBITAL  Final   Special Requests   Final    BOTTLES DRAWN AEROBIC AND ANAEROBIC Blood Culture adequate volume   Culture   Final    NO GROWTH 3 DAYS Performed at [facility], [address] [place],  [address]    Report Status PENDING  Incomplete  Blood culture (routine x 2)     Status: None (Preliminary result)    Collection Time: 06/12/20  3:10 PM   Specimen: BLOOD  Result Value Ref Range Status   Specimen Description BLOOD RIGHT ANTECUBITAL  Final   Special Requests   Final    BOTTLES DRAWN AEROBIC AND ANAEROBIC Blood Culture adequate volume   Culture   Final  NO GROWTH 3 DAYS Performed at [facility], [place] [address], [place], [place] [address]    Report Status PENDING  Incomplete  SARS Coronavirus 2 by RT PCR (hospital order, performed in [facility] hospital lab) Nasopharyngeal Nasopharyngeal Swab     Status: None   Collection Time: 06/12/20  3:58 PM   Specimen: Nasopharyngeal Swab  Result Value Ref Range Status   SARS Coronavirus 2 NEGATIVE NEGATIVE Final    Comment: (NOTE) SARS-CoV-2 target nucleic acids are NOT DETECTED.  The SARS-CoV-2 RNA is generally detectable in upper and lower respiratory specimens during the acute phase of infection. The lowest concentration of SARS-CoV-2 viral copies this assay can detect is 250 copies / mL. A negative result does not preclude SARS-CoV-2 infection and should not be used as the sole basis for treatment or other patient management decisions.  A negative result may occur with improper specimen collection / handling, submission of specimen other than nasopharyngeal swab, presence of viral mutation(s) within the areas targeted by this assay, and inadequate number of viral copies (<250 copies / mL). A negative result must be combined with clinical observations, patient history, and epidemiological information.  Fact Sheet for Patients:   StrictlyIdeas.no  Fact Sheet for Healthcare Providers: BankingDealers.co.za  This test is not yet approved or  cleared by the [place] FDA and has been authorized for detection and/or diagnosis of SARS-CoV-2 by FDA under an Emergency Use Authorization (EUA).  This EUA will remain in effect (meaning this test can be used) for the duration of  the COVID-19 declaration under Section 564(b)(1) of the Act, 21 U.S.C. section 360bbb-3(b)(1), unless the authorization is terminated or revoked sooner.  Performed at [facility], [place] [address], [place], [place] [address]   Urine culture     Status: Abnormal   Collection Time: 06/12/20  4:42 PM   Specimen: Urine, Catheterized  Result Value Ref Range Status   Specimen Description URINE, CATHETERIZED  Final   Special Requests   Final    NONE Performed at [facility], [place] [address] [place], [place] [address]    Culture MULTIPLE SPECIES PRESENT, SUGGEST RECOLLECTION (A)  Final   Report Status 06/13/2020 FINAL  Final     Oswald Hillock   [facility] If 7PM-7AM, please contact night-coverage at [email or web address], Office  [phone]   06/15/2020, 2:08 PM  LOS: 3 days

## 2020-06-15 NOTE — Plan of Care (Signed)
  Problem: Education: Goal: Knowledge of General Education information will improve Description Including pain rating scale, medication(s)/side effects and non-pharmacologic comfort measures Outcome: Progressing   Problem: Nutrition: Goal: Adequate nutrition will be maintained Outcome: Progressing   Problem: Pain Managment: Goal: General experience of comfort will improve Outcome: Progressing   

## 2020-06-15 NOTE — TOC Initial Note (Addendum)
Transition of Care [facility]) - Initial/Assessment Note    Patient Details  Name: Robert Gross MRN: [id number] Date of Birth: 05-Apr-1929  Transition of Care [facility]) CM/SW Contact:    Arvella Merles, [place] Phone Number: 06/15/2020, 7:47 AM  Clinical Narrative:                 CSW met bedside with patient's wife [name] and daughter. CSW spoke with family regarding PT recommendation for short-term SNF placement. Spouse expressed understanding and is in agreement with SNF placement at discharge. Family lives in [place] and would prefer the [facility] area, but provided permission to also fax out in [place]. CSW explained insurance authorization process and provided information for StartupExp[employer]. Patient is fully vaccinated.  Expected Discharge Plan: Skilled Nursing Facility Barriers to Discharge: Ship broker, Continued Medical Work up   Patient Goals and CMS Choice   CMS Medicare.gov Compare Post Acute Care list provided to:: Patient Represenative (must comment) G[facility] Crown) Choice offered to / list presented to : Spouse  Expected Discharge Plan and Services Expected Discharge Plan: [place] arrangements for the past 2 months: Single Family Home                                      Prior Living Arrangements/Services Living arrangements for the past 2 months: Single Family Home Lives with:: Spouse Patient language and need for interpreter reviewed:: Yes Do you feel safe going back to the place where you live?: Yes      Need for Family Participation in Patient Care: Yes (Comment) Care giver support system in place?: Yes (comment)   Criminal Activity/Legal Involvement Pertinent to Current Situation/Hospitalization: No - Comment as needed  Activities of Daily Living      Permission Sought/Granted Permission sought to share information with : Facility Sport and exercise psychologist, Family Supports Permission granted to share  information with : Yes, Verbal Permission Granted  Share Information with NAME: Miran Kautzman  Permission granted to share info w AGENCY: SNFs  Permission granted to share info w Relationship: Spouse  Permission granted to share info w Contact Information: [phone]  Emotional Assessment   Attitude/Demeanor/Rapport: Unable to Assess Affect (typically observed): Unable to Assess Orientation: : Oriented to Self Alcohol / Substance Use: Not Applicable Psych Involvement: No (comment)  Admission diagnosis:  Generalized weakness [R53.1] Urinary tract infection without hematuria, site unspecified [N39.0] Altered mental status, unspecified altered mental status type [R41.82] Patient Active Problem List   Diagnosis Date Noted  . Hyperlipemia   . Generalized weakness   . Hypercalcemia   . Communicating hydrocephalus ([place]) 04/08/2020  . Gait abnormality 03/06/2020  . Memory loss 02/12/2020  . Candiduria   . Memory difficulties   . Fall   . Acute metabolic encephalopathy 82/95/6213  . UTI (urinary tract infection)   . Urinary retention due to benign prostatic hyperplasia 09/28/2017  . Acute urinary retention 09/12/2017  . Sepsis ([place]) 09/12/2017  . Abdominal pain 09/12/2017  . Essential hypertension   . Diabetes mellitus without complication ([place])   . GERD (gastroesophageal reflux disease)    PCP:  [name], MD Pharmacy:   [facility], [place] [address] Phone: [phone] Fax: [phone]     Social Determinants of Health (SDOH) Interventions    Readmission Risk Interventions No flowsheet data  found.  

## 2020-06-15 NOTE — TOC Progression Note (Signed)
Transition of Care [facility]) - Progression Note    Patient Details  Name: Robert Gross MRN: [id number] Date of Birth: December 20, 1928  Transition of Care [facility]) CM/SW [place], [place] Phone Number: 06/15/2020, 3:41 PM  Clinical Narrative:    Insurance authorization started, reference [id number].   Expected Discharge Plan: Skilled Nursing Facility Barriers to Discharge: Ship broker, Continued Medical Work up  Expected Discharge Plan and Services Expected Discharge Plan: [place] arrangements for the past 2 months: Single Family Home                                       Social Determinants of Health (SDOH) Interventions    Readmission Risk Interventions No flowsheet data found.

## 2020-06-15 NOTE — Evaluation (Signed)
Clinical/Bedside Swallow Evaluation Patient Details  Name: Robert Gross MRN: [id number] Date of Birth: Sep 25, 1929  Today's Date: 06/15/2020 Time: SLP Start Time (ACUTE ONLY): 1125 SLP Stop Time (ACUTE ONLY): 1138 SLP Time Calculation (min) (ACUTE ONLY): 13 min  Past Medical History:  Past Medical History:  Diagnosis Date  . Arthritis   . BPH (benign prostatic hyperplasia)   . BPH with urinary obstruction   . Cataracts, bilateral   . Dizzy spells    residual from concussion 09-05-2017 intermittantly when turns head but stated as of 09-26-2017 no issues for past 2-3 days  . Essential hypertension   . Gait abnormality   . GERD (gastroesophageal reflux disease)   . History of concussion    09-05-2017  w/ loc --- per pt residual intermittant dizziness when he turns his head either way  . History of prostatitis   . History of sepsis    09-12-2017  urosepsis  . History of urinary retention   . Hydrocephalus ([place])   . Hypercalcemia   . Hyperlipemia   . Memory loss   . Metabolic encephalopathy   . Osteopenia   . Squamous cell carcinoma in situ    multiple spots  . Type 2 diabetes mellitus ([place])   . Urinary retention    requires intermittent caths  . Vitamin D deficiency   . Vocal cord paralysis   . Wears partial dentures    upper   Past Surgical History:  Past Surgical History:  Procedure Laterality Date  . CATARACT EXTRACTION W/ INTRAOCULAR LENS  IMPLANT, BILATERAL  03/2017  . EXICISION EPIDERMAL CYST LEFT THUMB  09-12-2001   dr sypher  [facility]  . INGUINAL HERNIA REPAIR Right 11-07-2007   dr Dalbert [facility]  . KNEE ARTHROSCOPY Right 1990s  . MIDDLE EAR SURGERY     cyst removal  . SQUAMOUS CELL CARCINOMA EXCISION    . TRANSURETHRAL RESECTION OF PROSTATE  07-26-2000  dr Amalia [facility]  . TRANSURETHRAL RESECTION OF PROSTATE N/A 09/28/2017   Procedure: BIPOLAR TRANSURETHRAL RESECTION OF THE PROSTATE (TURP);  Surgeon: Lucas Mallow, MD;  [facility];   Service: Urology;  Laterality: N/A;  . VENTRICULOPERITONEAL SHUNT Right 04/08/2020   Procedure: Shunt Placment - right occipital;  Surgeon: Earnie Larsson, MD;  Location: [facility];  Service: Neurosurgery;  Laterality: Right;   HPI:  Pt is an 86 y.o. male with medical history significant of hypertension, type 2 diabetes mellitus, GERD, urinary retention, urge incontinence, BPH, arthritis, dementia, gait instability, recurrent urinary tract infection, communicating hydrocephalus status post VP shunt placement who was brought by EMS to emergency department due to generalized weakness for two days. CT head 7/22: Mild diffuse cortical atrophy. Mild chronic ischemic white matter disease. Stable position of right ventriculostomy catheter. No acute changes. CXR: Mild atelectasis/scarring at the lung bases.   Assessment / Plan / Recommendation Clinical Impression  Pt was seen for bedside swallow evaluation. Pt's daughter reported that the pt "gets strangled" less than once per day and has been eating slowly since the diagnosis of hydrocephalus. Oral mechanism exam was limited due to pt's difficulty following some commands; however, oral motor strength and ROM appeared grossly WFL. He demonstrated coughing at baseline and coughing was noted with the initial bolus of ice chips. However, no other signs or symptoms of aspiration were noted with additional boluses of ice or with other consistencies. Mastication time was mildly increased but functional and is at baseline per the pt's daughter. It is recommended  that the pt's current diet of regular texture solids with thin liquids be continued and further skilled SLP services are not clinically indicated for swallowing.  SLP Visit Diagnosis: Dysphagia, unspecified (R13.10)    Aspiration Risk  Mild aspiration risk    Diet Recommendation Regular;Thin liquid   Liquid Administration via: Cup;Straw Medication Administration: Whole meds with liquid (crush larger pills/give  with puree) Supervision: Staff to assist with self feeding Compensations: Minimize environmental distractions;Small sips/bites Postural Changes: Seated upright at 90 degrees    Other  Recommendations Oral Care Recommendations: Oral care BID   Follow up Recommendations None      Frequency and Duration            Prognosis        Swallow Study   General Date of Onset: 06/14/20 HPI: Pt is an 86 y.o. male with medical history significant of hypertension, type 2 diabetes mellitus, GERD, urinary retention, urge incontinence, BPH, arthritis, dementia, gait instability, recurrent urinary tract infection, communicating hydrocephalus status post VP shunt placement who was brought by EMS to emergency department due to generalized weakness for two days. CT head 7/22: Mild diffuse cortical atrophy. Mild chronic ischemic white matter disease. Stable position of right ventriculostomy catheter. No acute changes. CXR: Mild atelectasis/scarring at the lung bases. Type of Study: Bedside Swallow Evaluation Previous Swallow Assessment: none Diet Prior to this Study: Regular;Thin liquids Temperature Spikes Noted: No Respiratory Status: Room air History of Recent Intubation: No Behavior/Cognition: Alert;Cooperative;Pleasant mood Oral Cavity Assessment: Within Functional Limits Oral Care Completed by SLP: No Oral Cavity - Dentition: Adequate natural dentition Vision: Functional for self-feeding Self-Feeding Abilities: Able to feed self Patient Positioning: Upright in bed;Postural control adequate for testing Baseline Vocal Quality: Normal Volitional Cough: Strong Volitional Swallow: Able to elicit    Oral/Motor/Sensory Function Overall Oral Motor/Sensory Function: Within functional limits   Ice Chips Ice chips: Impaired Presentation: Spoon Pharyngeal Phase Impairments: Throat Clearing - Immediate (with initial bolus only and coughing noted at baseline. )   Thin Liquid Thin Liquid: Within  functional limits Presentation: Straw    Nectar Thick Nectar Thick Liquid: Not tested   Honey Thick Honey Thick Liquid: Not tested   Puree Puree: Within functional limits Presentation: Spoon   Solid     Solid: Within functional limits Presentation: Self Fed Other Comments: Mastication mildly prolonged but functional and at baseline per family.      Lorielle Boehning I. Hardin Negus, [place], [place] Office number [phone] Pager [phone]  Horton Marshall 06/15/2020,11:46 AM

## 2020-06-16 LAB — BASIC METABOLIC PANEL
Anion gap: 6 (ref 5–15)
BUN: 15 mg/dL (ref 8–23)
CO2: 22 mmol/L (ref 22–32)
Calcium: 9.1 mg/dL (ref 8.9–10.3)
Chloride: 111 mmol/L (ref 98–111)
Creatinine, Ser: 0.93 mg/dL (ref 0.61–1.24)
GFR calc Af Amer: >60 mL/min (ref >60)
GFR calc non Af Amer: >60 mL/min (ref >60)
Glucose, Bld: 131 mg/dL — ABNORMAL HIGH (ref 70–99)
Potassium: 3.9 mmol/L (ref 3.5–5.1)
Sodium: 139 mmol/L (ref 135–145)

## 2020-06-16 LAB — CBC
HCT: 35.1 % — ABNORMAL LOW (ref 39.0–52.0)
Hemoglobin: 12 g/dL — ABNORMAL LOW (ref 13.0–17.0)
MCH: 33.4 pg (ref 26.0–34.0)
MCHC: 34.2 g/dL (ref 30.0–36.0)
MCV: 97.8 fL (ref 80.0–100.0)
Platelets: 232 10*3/uL (ref 150–400)
RBC: 3.59 MIL/uL — ABNORMAL LOW (ref 4.22–5.81)
RDW: 13 % (ref 11.5–15.5)
WBC: 13.1 10*3/uL — ABNORMAL HIGH (ref 4.0–10.5)
nRBC: 0 % (ref 0.0–0.2)

## 2020-06-16 LAB — GLUCOSE, CAPILLARY
Glucose-Capillary: 123 mg/dL — ABNORMAL HIGH (ref 70–99)
Glucose-Capillary: 123 mg/dL — ABNORMAL HIGH (ref 70–99)
Glucose-Capillary: 121 mg/dL — ABNORMAL HIGH (ref 70–99)
Glucose-Capillary: 144 mg/dL — ABNORMAL HIGH (ref 70–99)
Glucose-Capillary: 126 mg/dL — ABNORMAL HIGH (ref 70–99)

## 2020-06-16 MED ORDER — FLUCONAZOLE 100MG IVPB: 100.0000 mg | INTRAVENOUS | Status: DC

## 2020-06-16 MED ORDER — FLUCONAZOLE IN SODIUM CHLORIDE 200-0.9 MG/100ML-% IV SOLN
200.0000 mg | INTRAVENOUS | Status: DC
  Administered 2020-06-16 – 2020-06-17 (×2): 200 mg via INTRAVENOUS
  Filled 2020-06-16 (×2): qty 100

## 2020-06-16 NOTE — Plan of Care (Signed)
  Problem: Education: Goal: Knowledge of General Education information will improve Description: Including pain rating scale, medication(s)/side effects and non-pharmacologic comfort measures Outcome: Progressing   Problem: Nutrition: Goal: Adequate nutrition will be maintained Outcome: Progressing   Problem: Activity: Goal: Risk for activity intolerance will decrease Outcome: Progressing   

## 2020-06-16 NOTE — Progress Notes (Addendum)
[facility]  PROGRESS NOTE  Robert Gross:[id number] DOB: 04-Dec-1928 DOA: 06/12/2020 PCP: [name], MD   Brief HPI:   86 year old male with medical history of hypertension, diabetes mellitus type 2, GERD, urine retention, urge incontinence, BPH, arthritis, dementia, gait instability, recurrent UTIs, communicating hydrocephalus status post VP shunt placement was brought by EMS for generalized weakness for past 2 days. In the ED was found to have abnormal UA, started on IV ceftriaxone.   Subjective   Patient seen and examined, pleasantly confused. He is not confues at baseline as per daughter.   Assessment/Plan:     1. Generalized weakness-multifactorial from electrolyte abnormalities and UTI. Patient's magnesium was 0.9, magnesium was replaced and repeat magnesium is 1.7 .  Started on magnesium oxide 400 mg daily for 3 days.  IV antibiotics have been discontinued.   PT recommends skilled nursing facility placement.   TOC consulted for skilled nursing facility placement.   2. Altered mental status- worse from baseline, likely from UTI, he has been started on Diflucan for UTI. Sitter at bedside. Also could be worsening dementia. 3. Communicating hydrocephalus-patient has VP shunt in place in May 2021, CT head showed no acute findings. 4. UTI-urine culture growing yeast, will start Diflucan for 7 days. 5. History of dementia-behavioral disturbance, improved.  Continue with Ativan 0.5 mg every 4 hours as needed. 6. Hypertension-blood pressure is fairly well controlled, continue lisinopril, was started on 5 mg amlodipine yesterday.  Continue hydralazine 25 mg p.o. every 6 hours as needed for blood pressure greater than 160/100.  7. Diabetes mellitus type 2-continue sliding scale insulin with NovoLog. CBG well controlled. 8. Hypercalcemia-patient is on home vitamin D supplements which is currently on hold. Corrected calcium is down to 10.5.  IV fluids have been discontinued due  to mild fluid overload. 9. Hypokalemia-replete   Scheduled medications:   . amLODipine  5 mg Oral Daily  . darifenacin  7.5 mg Oral Daily  . enoxaparin (LOVENOX) injection  40 mg Subcutaneous Q24H  . insulin aspart  0-15 Units Subcutaneous TID WC  . insulin aspart  0-5 Units Subcutaneous QHS  . lisinopril  40 mg Oral Daily  . pantoprazole  20 mg Oral BID      CBG: Recent Labs  Lab 06/15/20 1214 06/15/20 1636 06/15/20 2101 06/16/20 0603 06/16/20 0728  GLUCAP 163* 97 134* 123* 123*    SpO2: 96 % O2 Flow Rate (L/min): (S) 2 L/min    CBC: Recent Labs  Lab 06/12/20 1309 06/12/20 1844 06/13/20 0207 06/16/20 0324  WBC 9.1 8.3 8.4 13.1*  NEUTROABS 6.8  --   --   --   HGB 13.1 10.4* 13.2 12.0*  HCT 39.7 32.3* 40.2 35.1*  MCV 100.5* 100.3* 98.8 97.8  PLT 203 191 184 811    Basic Metabolic Panel: Recent Labs  Lab 06/12/20 1309 06/12/20 1309 06/12/20 1844 06/13/20 0207 06/14/20 0228 06/15/20 0422 06/16/20 0324  NA 138  --   --  139 143 138 139  K 3.9  --   --  3.5 3.7 3.4* 3.9  CL 105  --   --  105 112* 111 111  CO2 24  --   --  23 23 20* 22  GLUCOSE 119*  --   --  117* 132* 137* 131*  BUN 20  --   --  14 11 11 15   CREATININE 1.09   < > 0.93 0.83 0.83 0.75 0.93  CALCIUM 10.7*  --   --  10.7* 9.3 8.9 9.1  MG  --   --  0.9*  --  1.7  --   --    < > = values in this interval not displayed.     Liver Function Tests: Recent Labs  Lab 06/12/20 1309 06/13/20 0207 06/14/20 0228 06/15/20 0422  AST 12* 17 16 16   ALT 15 13 15 17   ALKPHOS 87 93 93 93  BILITOT 0.8 1.1 0.9 1.1  PROT 5.6* 5.7* 5.4* 5.0*  ALBUMIN 2.7* 2.7* 2.5* 2.4*     Antibiotics: Anti-infectives (From admission, onward)   Start     Dose/Rate Route Frequency Ordered Stop   06/13/20 1700  cefTRIAXone (ROCEPHIN) 1 g in sodium chloride 0.9 % 100 mL IVPB  Status:  Discontinued        1 g 200 mL/hr over 30 Minutes Intravenous Every 24 hours 06/12/20 1823 06/14/20 1446   06/12/20 1745   cefTRIAXone (ROCEPHIN) 1 g in sodium chloride 0.9 % 100 mL IVPB        1 g 200 mL/hr over 30 Minutes Intravenous  Once 06/12/20 1733 06/12/20 1810       DVT prophylaxis: Lovenox  Code Status: Full code  Family Communication: Discussed with patient's wife and daughter at bedside.    Status is: Inpatient  Dispo: The patient is from: Home              Anticipated d/c is to: Home versus skilled nursing facility              Anticipated d/c date is: 06/17/2020              Patient currently medically stable for discharge  Barrier to discharge-awaiting placement in skilled nursing facility.      Objective   Vitals:   06/16/20 0413 06/16/20 0415 06/16/20 0918 06/16/20 1123  BP:   (!) 150/77 (!) 151/70  Pulse:   103 82  Resp:   14 18  Temp:   98.3 F (36.8 C) 98 F (36.7 C)  TempSrc:   Oral Oral  SpO2:   97% 96%  Weight: 70 kg 70.3 kg    Height:        Intake/Output Summary (Last 24 hours) at 06/16/2020 1142 Last data filed at 06/16/2020 1124 Gross per 24 hour  Intake 427.75 ml  Output 1050 ml  Net -622.25 ml    07/24 1901 - 07/26 0700 In: 1091.6 [P.O.:360; I.V.:731.6] Out: 1000 [Urine:1000]  Filed Weights   06/15/20 0500 06/16/20 0413 06/16/20 0415  Weight: 67.6 kg 70 kg 70.3 kg    Physical Examination:    General-appears in no acute distress  Heart-S1-S2, regular, no murmur auscultated  Lungs-clear to auscultation bilaterally, no wheezing or crackles auscultated  Abdomen-soft, nontender, no organomegaly  Extremities-no edema in the lower extremities  Neuro-alert, not oriented x3, pleasantly confused    Data Reviewed:   Recent Results (from the past 240 hour(s))  Blood culture (routine x 2)     Status: None (Preliminary result)   Collection Time: 06/12/20  3:10 PM   Specimen: BLOOD  Result Value Ref Range Status   Specimen Description BLOOD RIGHT ANTECUBITAL  Final   Special Requests   Final    BOTTLES DRAWN AEROBIC AND ANAEROBIC Blood  Culture adequate volume   Culture   Final    NO GROWTH 4 DAYS Performed at [facility], [address] [place], [place] [address]    Report Status PENDING  Incomplete  Blood culture (routine x 2)     Status: None (Preliminary result)   Collection Time: 06/12/20  3:10 PM   Specimen: BLOOD  Result Value Ref Range Status   Specimen Description BLOOD RIGHT ANTECUBITAL  Final   Special Requests   Final    BOTTLES DRAWN AEROBIC AND ANAEROBIC Blood Culture adequate volume   Culture   Final    NO GROWTH 4 DAYS Performed at [facility], [place] [address] [place], [place] [address]    Report Status PENDING  Incomplete  SARS Coronavirus 2 by RT PCR (hospital order, performed in [facility] hospital lab) Nasopharyngeal Nasopharyngeal Swab     Status: None   Collection Time: 06/12/20  3:58 PM   Specimen: Nasopharyngeal Swab  Result Value Ref Range Status   SARS Coronavirus 2 NEGATIVE NEGATIVE Final    Comment: (NOTE) SARS-CoV-2 target nucleic acids are NOT DETECTED.  The SARS-CoV-2 RNA is generally detectable in upper and lower respiratory specimens during the acute phase of infection. The lowest concentration of SARS-CoV-2 viral copies this assay can detect is 250 copies / mL. A negative result does not preclude SARS-CoV-2 infection and should not be used as the sole basis for treatment or other patient management decisions.  A negative result may occur with improper specimen collection / handling, submission of specimen other than nasopharyngeal swab, presence of viral mutation(s) within the areas targeted by this assay, and inadequate number of viral copies (<250 copies / mL). A negative result must be combined with clinical observations, patient history, and epidemiological information.  Fact Sheet for Patients:   StrictlyIdeas.no  Fact Sheet for Healthcare Providers: BankingDealers.co.za  This test is not yet approved or   cleared by the [place] FDA and has been authorized for detection and/or diagnosis of SARS-CoV-2 by FDA under an Emergency Use Authorization (EUA).  This EUA will remain in effect (meaning this test can be used) for the duration of the COVID-19 declaration under Section 564(b)(1) of the Act, 21 U.S.C. section 360bbb-3(b)(1), unless the authorization is terminated or revoked sooner.  Performed at [facility], [place] [address], [place], [place] [address]   Urine culture     Status: Abnormal   Collection Time: 06/12/20  4:42 PM   Specimen: Urine, Catheterized  Result Value Ref Range Status   Specimen Description URINE, CATHETERIZED  Final   Special Requests   Final    NONE Performed at [facility], [place] [address], [place], [place] [address]    Culture MULTIPLE SPECIES PRESENT, SUGGEST RECOLLECTION (A)  Final   Report Status 06/13/2020 FINAL  Final  Culture, Urine     Status: Abnormal   Collection Time: 06/13/20  9:17 PM   Specimen: Urine, Clean Catch  Result Value Ref Range Status   Specimen Description URINE, CLEAN CATCH  Final   Special Requests   Final    NONE Performed at [facility], [place] [address], [place], [place] [address]    Culture 20,000 COLONIES/mL YEAST (A)  Final   Report Status 06/15/2020 FINAL  Final     Oswald Hillock   [facility] If 7PM-7AM, please contact night-coverage at [email or web address], Office  [phone]   06/16/2020, 11:42 AM  LOS: 4 days

## 2020-06-16 NOTE — TOC Progression Note (Signed)
Transition of Care [facility]) - Progression Note    Patient Details  Name: Robert Gross MRN: [id number] Date of Birth: 01-08-1929  [facility]) CM/SW Contact  Chantry Headen, Francetta Found, LCSW Phone Number: 06/16/2020, 4:32 PM  Clinical Narrative:    CSW provided patient wife and daughter available bed offers.  They have expressed interest in [place].  No official bed offer at this time.  CSW left a message for Olivia Mackie in admissions to express patient family interest.  CSW will follow up with patient family again to further address bed offers pending response from [employer].   Expected Discharge Plan: Skilled Nursing Facility Barriers to Discharge: Ship broker, Continued Medical Work up  Expected Discharge Plan and Services Expected Discharge Plan: [place] arrangements for the past 2 months: Single Family Home                                       Social Determinants of Health (SDOH) Interventions    Readmission Risk Interventions No flowsheet data found.

## 2020-06-17 LAB — CULTURE, BLOOD (ROUTINE X 2)
Culture: NO GROWTH
Culture: NO GROWTH
Special Requests: ADEQUATE
Special Requests: ADEQUATE

## 2020-06-17 LAB — CBC
HCT: 36.7 % — ABNORMAL LOW (ref 39.0–52.0)
Hemoglobin: 12.5 g/dL — ABNORMAL LOW (ref 13.0–17.0)
MCH: 33.2 pg (ref 26.0–34.0)
MCHC: 34.1 g/dL (ref 30.0–36.0)
MCV: 97.6 fL (ref 80.0–100.0)
Platelets: 242 10*3/uL (ref 150–400)
RBC: 3.76 MIL/uL — ABNORMAL LOW (ref 4.22–5.81)
RDW: 12.8 % (ref 11.5–15.5)
WBC: 10.6 10*3/uL — ABNORMAL HIGH (ref 4.0–10.5)
nRBC: 0 % (ref 0.0–0.2)

## 2020-06-17 LAB — GLUCOSE, CAPILLARY
Glucose-Capillary: 114 mg/dL — ABNORMAL HIGH (ref 70–99)
Glucose-Capillary: 167 mg/dL — ABNORMAL HIGH (ref 70–99)
Glucose-Capillary: 142 mg/dL — ABNORMAL HIGH (ref 70–99)

## 2020-06-17 MED ORDER — AMLODIPINE BESYLATE 5 MG PO TABS: 5.0000 mg | ORAL_TABLET | Freq: Every day | ORAL | 3 refills | Status: AC

## 2020-06-17 MED ORDER — FLUCONAZOLE 100 MG PO TABS
100.0000 mg | ORAL_TABLET | Freq: Every day | ORAL | 0 refills | Status: AC
Start: 2020-06-18 — End: 2020-06-23

## 2020-06-17 NOTE — Progress Notes (Signed)
Patient has been discharged from unit via wheel chair. All IV's and tele have been removed. Pt reports no pain. AVS documentation has been given and reviewed. Pt belongings have been sent with patient. Pt sent in good spirits

## 2020-06-17 NOTE — Progress Notes (Signed)
Physical Therapy Treatment Patient Details Name: Robert Gross MRN: [id number] DOB: 10-13-1929 Today's Date: 06/17/2020    History of Present Illness 86 yo male with increased weakness of 2 days found to have UTI and electrolyte imbalances. PMH PBH, B cataracts, hx concussion, HTN, gait abnormality, hydrocephalus s/p VP shunt, HLD, memory loss/dementia, metabolic encephalopathy, DM, vocal cord paralysis, R knee arthroscopy    PT Comments    Patient progressing well towards PT goals. Tolerated gait training today with Min A for balance/safety and cues for RW proximity. Pt required 2 seated rest breaks during mobility. Pt requires repetition to follow 1-2 step commands. Continues to be a high fall risk. Requires Mod A to stand from all surfaces. Daughter would like pt to d/c home with HHPT and an aide. CM made aware of updated recommendations. Encouraged OOB to chair for all meals and walking to bathroom with nursing with RW. Nurse tech made aware. Will continue to follow and progress.    Follow Up Recommendations  Home health PT;Supervision for mobility/OOB;Supervision/Assistance - 24 hour;Other (comment) ([facility] aide)     Equipment Recommendations  None recommended by PT    Recommendations for Other Services       Precautions / Restrictions Precautions Precautions: Fall Restrictions Weight Bearing Restrictions: No    Mobility  Bed Mobility Overal bed mobility: Needs Assistance Bed Mobility: Supine to Sit     Supine to sit: Mod assist;HOB elevated     General bed mobility comments: assist with trunk to get to EOB.  Transfers Overall transfer level: Needs assistance Equipment used: Rolling walker (2 wheeled) Transfers: Sit to/from Stand Sit to Stand: Mod assist;From elevated surface         General transfer comment: assist to power to standing with cues for hand placement/technique. Stood from [employer], from chair x2.  Ambulation/Gait Ambulation/Gait assistance: Min  assist;+2 safety/equipment Gait Distance (Feet): 15 Feet (+100' + 100') Assistive device: Rolling walker (2 wheeled) Gait Pattern/deviations: Step-through pattern;Decreased stride length;Narrow base of support;Trunk flexed Gait velocity: decreased   General Gait Details: Slow, mildly unsteady gait with cues for upright posture, RW proximity and balance. 2 seated rest breaks. HR up to 116 bpm.   Stairs             Wheelchair Mobility    Modified Rankin (Stroke Patients Only)       Balance Overall balance assessment: Needs assistance;History of Falls Sitting-balance support: Feet supported;No upper extremity supported Sitting balance-Leahy Scale: Fair     Standing balance support: During functional activity Standing balance-Leahy Scale: Poor Standing balance comment: Requires Ue support in standing.                            Cognition Arousal/Alertness: Awake/alert Behavior During Therapy: WFL for tasks assessed/performed Overall Cognitive Status: Impaired/Different from baseline Area of Impairment: Orientation;Attention;Memory;Following commands;Safety/judgement;Awareness;Problem solving                 Orientation Level: Disoriented to;Place;Situation Current Attention Level: Selective Memory: Decreased short-term memory Following Commands: Follows one step commands with increased time;Follows multi-step commands inconsistently Safety/Judgement: Decreased awareness of safety;Decreased awareness of deficits   Problem Solving: Slow processing;Requires verbal cues General Comments: Mixed level of following commands requiring repetition. Able to recognize and state wife and daughter in room. Oriented to self only.      Exercises      General Comments General comments (skin integrity, edema, etc.): WIfe and daughter present during session.  Daughter reports she would like to take pt home if possible with HHPT and an aide. CM made aware. Encouraged  having someone there at [place] initially to help with mobility/safety.      Pertinent Vitals/Pain Pain Assessment: No/denies pain    Home Living                      Prior Function            PT Goals (current goals can now be found in the care plan section) Progress towards PT goals: Progressing toward goals    Frequency    Min 3X/week      PT Plan Discharge plan needs to be updated;Frequency needs to be updated    Co-evaluation              AM-PAC PT "6 Clicks" Mobility   Outcome Measure  Help needed turning from your back to your side while in a flat bed without using bedrails?: None Help needed moving from lying on your back to sitting on the side of a flat bed without using bedrails?: A Little Help needed moving to and from a bed to a chair (including a wheelchair)?: A Lot Help needed standing up from a chair using your arms (e.g., wheelchair or bedside chair)?: A Lot Help needed to walk in hospital room?: A Little Help needed climbing 3-5 steps with a railing? : Total 6 Click Score: 15    End of Session Equipment Utilized During Treatment: Gait belt Activity Tolerance: Patient tolerated treatment well Patient left: in chair;with call bell/phone within reach;with chair alarm set;with family/visitor present Nurse Communication: Mobility status;Other (comment) (d/c rec updated) PT Visit Diagnosis: Unsteadiness on feet (R26.81);Muscle weakness (generalized) (M62.81);History of falling (Z91.81);Difficulty in walking, not elsewhere classified (R26.2)     Time: 1132-1202 PT Time Calculation (min) (ACUTE ONLY): 30 min  Charges:  $Gait Training: 23-37 mins                     Marisa Severin, PT, DPT Acute Rehabilitation Services Pager [phone] Office [phone]       [place] 06/17/2020, 12:58 PM

## 2020-06-17 NOTE — TOC Transition Note (Signed)
Transition of Care [facility]) - CM/SW Discharge Note   Patient Details  Name: Robert Gross MRN: [id number] Date of Birth: Mar 24, 1929  Transition of [facility]) CM/SW Contact:  Pollie Friar, RN Phone Number: 06/17/2020, 2:24 PM   Clinical Narrative:    PT saw patient and in agreement with pt d/cing home with 24 hour supervision. CM met with the pt, spouse and daughter and he will have needed supervision. Pt has all needed DME at the home.  Home health arranged through [facility] who was already active with the patient. Tiffany with [facility] is aware of resumption orders and d/c home today.  Pt has transportation home.    Final next level of care: Home w Home Health Services Barriers to Discharge: No Barriers Identified   Patient Goals and CMS Choice   CMS Medicare.gov Compare Post Acute Care list provided to:: Patient Represenative (must comment) Choice offered to / list presented to : Adult Children, Spouse  Discharge Placement                       Discharge Plan and Services                          HH Arranged: PT, OT, Nurse's Aide [place] Agency: [facility] (formerly [employer]) Date [place]: 06/17/20   Representative spoke with at [place]: [place] Interventions     Readmission Risk Interventions No flowsheet data found.

## 2020-06-17 NOTE — Discharge Summary (Signed)
Physician Discharge Summary  Robert Gross QVZ:[id number] DOB: 03-03-1929 DOA: 06/12/2020  PCP: [name], MD  Admit date: 06/12/2020 Discharge date: 06/17/2020  Time spent: 50 minutes  Recommendations for outpatient follow-up:  1. Follow-up PCP in 2 weeks   Discharge Diagnoses:  Principal Problem:   Generalized weakness Active Problems:   Essential hypertension   Diabetes mellitus without complication (HCC)   GERD (gastroesophageal reflux disease)   UTI (urinary tract infection)   Communicating hydrocephalus (HCC)   Hyperlipemia   Hypercalcemia   Discharge Condition: Stable  Diet recommendation: Heart healthy diet  Filed Weights   06/16/20 0413 06/16/20 0415 06/17/20 0430  Weight: 70 kg 70.3 kg 69.4 kg    History of present illness:  86 year old male with medical history of hypertension, diabetes mellitus type 2, GERD, urine retention, urge incontinence, BPH, arthritis, dementia, gait instability, recurrent UTIs, communicating hydrocephalus status post VP shunt placement was brought by EMS for generalized weakness for past 2 days. In the ED was found to have abnormal UA, started on IV ceftriaxone.  Hospital Course:   1. Generalized weakness-multifactorial from electrolyte abnormalities and UTI. Patient's magnesium was 0.9, magnesium was replaced and repeat magnesium is 1.7 .  Started on magnesium oxide 400 mg daily for 3 days.  IV antibiotics have been discontinued.   PT recommends skilled nursing facility placement.  Family wants to take patient home with home health.  Will discharge home with home health PT.   2. Altered mental status-significantly improved today.  He was started on Diflucan for UTI.  Will discharge Diflucan 100 and p.o. daily for 5 more days.   3. Communicating hydrocephalus-patient has VP shunt in place in May 2021, CT head showed no acute findings. 4. UTI-urine culture growing yeast, will start Diflucan for 7 days. 5. History of  dementia-behavioral disturbance, improved.   6. Hypertension-blood pressure is fairly well controlled, continue lisinopril, was started on 5 mg amlodipine yesterday.   7. Diabetes mellitus type 2-continue Metformin 8. Hypercalcemia-patient is on home vitamin D supplements which is currently on hold. Corrected calcium is down to 10.5.  IV fluids have been discontinued due to mild fluid overload. 9. Hypokalemia-replete  Procedures:    Consultations:    Discharge Exam: Vitals:   06/17/20 0753 06/17/20 1202  BP: (!) 135/95 (!) 151/82  Pulse: (!) 106 103  Resp: 18 18  Temp: 97.6 F (36.4 C) 97.6 F (36.4 C)  SpO2: 99% 97%    General: Appears in no acute distress Cardiovascular: S1-S2, regular Respiratory: Clear to auscultation bilaterally  Discharge Instructions    Allergies as of 06/17/2020   No Known Allergies     Medication List    TAKE these medications   amLODipine 5 MG tablet Commonly known as: NORVASC Take 1 tablet (5 mg total) by mouth daily. Start taking on: June 18, 2020   fluconazole 100 MG tablet Commonly known as: Diflucan Take 1 tablet (100 mg total) by mouth daily for 5 days. Start taking on: June 18, 2020   lisinopril 40 MG tablet Commonly known as: ZESTRIL Take 40 mg by mouth daily.   metFORMIN 500 MG tablet Commonly known as: GLUCOPHAGE Take 500-1,000 mg by mouth See admin instructions. Take 1000 mg in the morning and 500 mg in the evening   pantoprazole 20 MG tablet Commonly known as: PROTONIX Take 20 mg 2 (two) times daily by mouth.   trospium 20 MG tablet Commonly known as: SANCTURA Take 20 mg by mouth at bedtime.  Vitamin D (Ergocalciferol) 1.25 MG (50000 UNIT) Caps capsule Commonly known as: DRISDOL Take 50,000 Units by mouth every 7 (seven) days. Wednesday      No Known Allergies    The results of significant diagnostics from this hospitalization (including imaging, microbiology, ancillary and laboratory) are listed below  for reference.    Significant Diagnostic Studies: CT Head Wo Contrast  Result Date: 06/12/2020 CLINICAL DATA:  Altered mental status. EXAM: CT HEAD WITHOUT CONTRAST TECHNIQUE: Contiguous axial images were obtained from the base of the skull through the vertex without intravenous contrast. COMPARISON:  April 23, 2020. FINDINGS: Brain: Mild diffuse cortical atrophy is noted. Mild chronic ischemic white matter disease is noted. Stable position of right ventriculostomy catheter tip seen just to the right of midline and right lateral ventricle. Lateral ventricles are unchanged in size, as they appear slightly dilated, but this most likely is due to surrounding atrophy. No midline shift is noted. No hemorrhage, mass lesion or acute infarction is noted. Vascular: No hyperdense vessel or unexpected calcification. Skull: Right posterior parietal ventriculostomy is noted. No acute abnormality is seen in the skull. Sinuses/Orbits: No acute finding. Other: None. IMPRESSION: Mild diffuse cortical atrophy. Mild chronic ischemic white matter disease. Stable position of right ventriculostomy catheter. Lateral ventricles are unchanged in size compared to prior exam, as they appear slightly dilated, but this most likely is due to surrounding atrophy. No acute intracranial abnormality seen. Electronically Signed   By: Marijo Conception M.D.   On: 06/12/2020 14:45   DG Chest Portable 1 View  Result Date: 06/12/2020 CLINICAL DATA:  Shortness of breath EXAM: PORTABLE CHEST 1 VIEW COMPARISON:  04/23/2020 FINDINGS: Probable mild atelectasis/scarring at the lung bases. No significant pleural effusion. No pneumothorax. Heart size is normal. IMPRESSION: Mild atelectasis/scarring at the lung bases. Electronically Signed   By: Macy Mis M.D.   On: 06/12/2020 14:13    Microbiology: Recent Results (from the past 240 hour(s))  Blood culture (routine x 2)     Status: None   Collection Time: 06/12/20  3:10 PM   Specimen: BLOOD   Result Value Ref Range Status   Specimen Description BLOOD RIGHT ANTECUBITAL  Final   Special Requests   Final    BOTTLES DRAWN AEROBIC AND ANAEROBIC Blood Culture adequate volume   Culture   Final    NO GROWTH 5 DAYS Performed at [facility], [address], [place], [place] [address]    Report Status 06/17/2020 FINAL  Final  Blood culture (routine x 2)     Status: None   Collection Time: 06/12/20  3:10 PM   Specimen: BLOOD  Result Value Ref Range Status   Specimen Description BLOOD RIGHT ANTECUBITAL  Final   Special Requests   Final    BOTTLES DRAWN AEROBIC AND ANAEROBIC Blood Culture adequate volume   Culture   Final    NO GROWTH 5 DAYS Performed at [facility], [place] [address], [place], [place] [address]    Report Status 06/17/2020 FINAL  Final  SARS Coronavirus 2 by RT PCR (hospital order, performed in [facility] hospital lab) Nasopharyngeal Nasopharyngeal Swab     Status: None   Collection Time: 06/12/20  3:58 PM   Specimen: Nasopharyngeal Swab  Result Value Ref Range Status   SARS Coronavirus 2 NEGATIVE NEGATIVE Final    Comment: (NOTE) SARS-CoV-2 target nucleic acids are NOT DETECTED.  The SARS-CoV-2 RNA is generally detectable in upper and lower respiratory specimens during the acute  phase of infection. The lowest concentration of SARS-CoV-2 viral copies this assay can detect is 250 copies / mL. A negative result does not preclude SARS-CoV-2 infection and should not be used as the sole basis for treatment or other patient management decisions.  A negative result may occur with improper specimen collection / handling, submission of specimen other than nasopharyngeal swab, presence of viral mutation(s) within the areas targeted by this assay, and inadequate number of viral copies (<250 copies / mL). A negative result must be combined with clinical observations, patient history, and epidemiological information.  Fact Sheet for Patients:    StrictlyIdeas.no  Fact Sheet for Healthcare Providers: BankingDealers.co.za  This test is not yet approved or  cleared by the [place] FDA and has been authorized for detection and/or diagnosis of SARS-CoV-2 by FDA under an Emergency Use Authorization (EUA).  This EUA will remain in effect (meaning this test can be used) for the duration of the COVID-19 declaration under Section 564(b)(1) of the Act, 21 U.S.C. section 360bbb-3(b)(1), unless the authorization is terminated or revoked sooner.  Performed at [facility], [place] [address] [place], [place] [address]   Urine culture     Status: Abnormal   Collection Time: 06/12/20  4:42 PM   Specimen: Urine, Catheterized  Result Value Ref Range Status   Specimen Description URINE, CATHETERIZED  Final   Special Requests   Final    NONE Performed at [facility], [place] [address], [place], [place] [address]    Culture MULTIPLE SPECIES PRESENT, SUGGEST RECOLLECTION (A)  Final   Report Status 06/13/2020 FINAL  Final  Culture, Urine     Status: Abnormal   Collection Time: 06/13/20  9:17 PM   Specimen: Urine, Clean Catch  Result Value Ref Range Status   Specimen Description URINE, CLEAN CATCH  Final   Special Requests   Final    NONE Performed at [facility], [place] [address], [place], [place] [address]    Culture 20,000 COLONIES/mL YEAST (A)  Final   Report Status 06/15/2020 FINAL  Final     Labs: Basic Metabolic Panel: Recent Labs  Lab 06/12/20 1309 06/12/20 1309 06/12/20 1844 06/13/20 0207 06/14/20 0228 06/15/20 0422 06/16/20 0324  NA 138  --   --  139 143 138 139  K 3.9  --   --  3.5 3.7 3.4* 3.9  CL 105  --   --  105 112* 111 111  CO2 24  --   --  23 23 20* 22  GLUCOSE 119*  --   --  117* 132* 137* 131*  BUN 20  --   --  14 11 11 15   CREATININE 1.09   < > 0.93 0.83 0.83 0.75 0.93  CALCIUM 10.7*  --   --  10.7* 9.3 8.9 9.1  MG  --   --  0.9*  --  1.7   --   --    < > = values in this interval not displayed.   Liver Function Tests: Recent Labs  Lab 06/12/20 1309 06/13/20 0207 06/14/20 0228 06/15/20 0422  AST 12* 17 16 16   ALT 15 13 15 17   ALKPHOS 87 93 93 93  BILITOT 0.8 1.1 0.9 1.1  PROT 5.6* 5.7* 5.4* 5.0*  ALBUMIN 2.7* 2.7* 2.5* 2.4*   No results for input(s): LIPASE, AMYLASE in the last 168 hours. No results for input(s): AMMONIA in the last 168 hours. CBC: Recent Labs  Lab  06/12/20 1309 06/12/20 1844 06/13/20 0207 06/16/20 0324 06/17/20 0340  WBC 9.1 8.3 8.4 13.1* 10.6*  NEUTROABS 6.8  --   --   --   --   HGB 13.1 10.4* 13.2 12.0* 12.5*  HCT 39.7 32.3* 40.2 35.1* 36.7*  MCV 100.5* 100.3* 98.8 97.8 97.6  PLT 203 191 184 232 242    CBG: Recent Labs  Lab 06/16/20 1636 06/16/20 2107 06/17/20 0625 06/17/20 0809 06/17/20 1206  GLUCAP 144* 126* 114* 167* 142*       Signed:  Oswald Hillock MD.  [facility] 06/17/2020, 12:57 PM

## 2020-06-23 DIAGNOSIS — N3000 Acute cystitis without hematuria: Secondary | ICD-10-CM | POA: Diagnosis not present

## 2020-06-23 NOTE — Telephone Encounter (Signed)
Mark, PT called office and left a VM asking for orders beginning 06/22/2020 for physical therapy. Also needs orders for a [facility] aide for bathing and personal care 1/week. Also needs orders for OT eval. Please call back at [phone], may leave a VM.

## 2020-06-23 NOTE — Telephone Encounter (Signed)
I returned the call to [facility], PT w/ [facility]. He will proceed with orders for PT, OT and [place] aide as outlined below.

## 2020-07-03 ENCOUNTER — Telehealth: Payer: Self-pay | Admitting: Neurology

## 2020-07-03 NOTE — Telephone Encounter (Signed)
[facility] called to report pt had a fall yesterday.  Vital signs normal, superficial scrapping of skin on bridge of nose and left palm. Pt denies pain, pt was able to walk with Margaretha Sheffield.  Contact person is Carlyon Shadow [phone]

## 2020-07-10 ENCOUNTER — Ambulatory Visit: Payer: Self-pay | Admitting: Neurology

## 2020-07-11 DIAGNOSIS — E785 Hyperlipidemia, unspecified: Secondary | ICD-10-CM | POA: Diagnosis not present

## 2020-07-11 DIAGNOSIS — Z125 Encounter for screening for malignant neoplasm of prostate: Secondary | ICD-10-CM | POA: Diagnosis not present

## 2020-07-11 DIAGNOSIS — E559 Vitamin D deficiency, unspecified: Secondary | ICD-10-CM | POA: Diagnosis not present

## 2020-07-11 DIAGNOSIS — E1169 Type 2 diabetes mellitus with other specified complication: Secondary | ICD-10-CM | POA: Diagnosis not present

## 2020-07-15 ENCOUNTER — Telehealth: Payer: Self-pay | Admitting: Neurology

## 2020-07-15 NOTE — Telephone Encounter (Signed)
noted 

## 2020-07-15 NOTE — Telephone Encounter (Signed)
OT Robert Gross with [facility] called to report the wife no longer wants the order for the Home Health aid for showering.  Robert Gross said wife is able to help with that with no problem.  Robert Gross has called to report they will cancel the order per the request of the wife.  If there are questions Robert Gross can be reached at [phone]

## 2020-07-17 DIAGNOSIS — G910 Communicating hydrocephalus: Secondary | ICD-10-CM | POA: Diagnosis not present

## 2020-07-18 DIAGNOSIS — E1169 Type 2 diabetes mellitus with other specified complication: Secondary | ICD-10-CM | POA: Diagnosis not present

## 2020-07-18 DIAGNOSIS — I10 Essential (primary) hypertension: Secondary | ICD-10-CM | POA: Diagnosis not present

## 2020-07-18 DIAGNOSIS — R531 Weakness: Secondary | ICD-10-CM | POA: Diagnosis not present

## 2020-07-18 DIAGNOSIS — H918X3 Other specified hearing loss, bilateral: Secondary | ICD-10-CM | POA: Diagnosis not present

## 2020-07-18 DIAGNOSIS — E785 Hyperlipidemia, unspecified: Secondary | ICD-10-CM | POA: Diagnosis not present

## 2020-07-18 DIAGNOSIS — E559 Vitamin D deficiency, unspecified: Secondary | ICD-10-CM | POA: Diagnosis not present

## 2020-07-18 DIAGNOSIS — N390 Urinary tract infection, site not specified: Secondary | ICD-10-CM | POA: Diagnosis not present

## 2020-07-18 DIAGNOSIS — Z0000 Encounter for general adult medical examination without abnormal findings: Secondary | ICD-10-CM | POA: Diagnosis not present

## 2020-07-18 DIAGNOSIS — R82998 Other abnormal findings in urine: Secondary | ICD-10-CM | POA: Diagnosis not present

## 2020-07-18 DIAGNOSIS — Z1212 Encounter for screening for malignant neoplasm of rectum: Secondary | ICD-10-CM | POA: Diagnosis not present

## 2020-07-18 DIAGNOSIS — G910 Communicating hydrocephalus: Secondary | ICD-10-CM | POA: Diagnosis not present

## 2020-07-22 ENCOUNTER — Telehealth: Payer: Self-pay | Admitting: Neurology

## 2020-07-22 NOTE — Telephone Encounter (Signed)
Reviewed neurosurgeon Dr. Lynann Bologna improved evaluation July 17, 2020, status post VP shunt placement for communicating hydrocephalus, patient and family feel that he is walking better, cognition is somewhat improving having no headaches,

## 2020-08-11 DIAGNOSIS — N401 Enlarged prostate with lower urinary tract symptoms: Secondary | ICD-10-CM | POA: Diagnosis not present

## 2020-08-11 DIAGNOSIS — N138 Other obstructive and reflux uropathy: Secondary | ICD-10-CM | POA: Diagnosis not present

## 2020-08-11 DIAGNOSIS — N312 Flaccid neuropathic bladder, not elsewhere classified: Secondary | ICD-10-CM | POA: Diagnosis not present

## 2020-08-11 DIAGNOSIS — N3946 Mixed incontinence: Secondary | ICD-10-CM | POA: Diagnosis not present

## 2020-08-13 DIAGNOSIS — N529 Male erectile dysfunction, unspecified: Secondary | ICD-10-CM | POA: Diagnosis not present

## 2020-08-13 DIAGNOSIS — R32 Unspecified urinary incontinence: Secondary | ICD-10-CM | POA: Diagnosis not present

## 2020-08-13 DIAGNOSIS — G3184 Mild cognitive impairment, so stated: Secondary | ICD-10-CM | POA: Diagnosis not present

## 2020-08-13 DIAGNOSIS — I10 Essential (primary) hypertension: Secondary | ICD-10-CM | POA: Diagnosis not present

## 2020-08-13 DIAGNOSIS — E1151 Type 2 diabetes mellitus with diabetic peripheral angiopathy without gangrene: Secondary | ICD-10-CM | POA: Diagnosis not present

## 2020-08-13 DIAGNOSIS — K219 Gastro-esophageal reflux disease without esophagitis: Secondary | ICD-10-CM | POA: Diagnosis not present

## 2020-08-13 DIAGNOSIS — N3281 Overactive bladder: Secondary | ICD-10-CM | POA: Diagnosis not present

## 2020-08-13 DIAGNOSIS — E559 Vitamin D deficiency, unspecified: Secondary | ICD-10-CM | POA: Diagnosis not present

## 2020-08-13 DIAGNOSIS — Z87891 Personal history of nicotine dependence: Secondary | ICD-10-CM | POA: Diagnosis not present

## 2020-08-13 DIAGNOSIS — Z9181 History of falling: Secondary | ICD-10-CM | POA: Diagnosis not present

## 2020-08-13 DIAGNOSIS — R609 Edema, unspecified: Secondary | ICD-10-CM | POA: Diagnosis not present

## 2020-08-13 DIAGNOSIS — Z87440 Personal history of urinary (tract) infections: Secondary | ICD-10-CM | POA: Diagnosis not present

## 2020-09-09 DIAGNOSIS — C44311 Basal cell carcinoma of skin of nose: Secondary | ICD-10-CM | POA: Diagnosis not present

## 2020-09-09 DIAGNOSIS — L570 Actinic keratosis: Secondary | ICD-10-CM | POA: Diagnosis not present

## 2020-09-09 DIAGNOSIS — C44319 Basal cell carcinoma of skin of other parts of face: Secondary | ICD-10-CM | POA: Diagnosis not present

## 2020-09-09 DIAGNOSIS — Z85828 Personal history of other malignant neoplasm of skin: Secondary | ICD-10-CM | POA: Diagnosis not present

## 2020-09-11 DIAGNOSIS — G910 Communicating hydrocephalus: Secondary | ICD-10-CM | POA: Diagnosis not present

## 2020-09-25 NOTE — Telephone Encounter (Signed)
Evaluation by neurosurgeon Dr. Earnie Larsson on September 11 2020, doing well following occipital VP shunt for the treatment of communicating hydrocephalus,

## 2020-10-09 DIAGNOSIS — C44311 Basal cell carcinoma of skin of nose: Secondary | ICD-10-CM | POA: Diagnosis not present

## 2020-10-09 DIAGNOSIS — Z85828 Personal history of other malignant neoplasm of skin: Secondary | ICD-10-CM | POA: Diagnosis not present

## 2020-10-09 DIAGNOSIS — C44319 Basal cell carcinoma of skin of other parts of face: Secondary | ICD-10-CM | POA: Diagnosis not present

## 2020-10-27 DIAGNOSIS — R600 Localized edema: Secondary | ICD-10-CM | POA: Diagnosis not present

## 2020-10-27 DIAGNOSIS — E785 Hyperlipidemia, unspecified: Secondary | ICD-10-CM | POA: Diagnosis not present

## 2020-10-27 DIAGNOSIS — I10 Essential (primary) hypertension: Secondary | ICD-10-CM | POA: Diagnosis not present

## 2020-10-27 DIAGNOSIS — R269 Unspecified abnormalities of gait and mobility: Secondary | ICD-10-CM | POA: Diagnosis not present

## 2020-10-27 DIAGNOSIS — E1169 Type 2 diabetes mellitus with other specified complication: Secondary | ICD-10-CM | POA: Diagnosis not present

## 2020-10-28 ENCOUNTER — Other Ambulatory Visit (HOSPITAL_COMMUNITY): Payer: Self-pay | Admitting: Internal Medicine

## 2020-10-28 ENCOUNTER — Other Ambulatory Visit: Payer: Self-pay

## 2020-10-28 ENCOUNTER — Ambulatory Visit (HOSPITAL_COMMUNITY)
Admission: RE | Admit: 2020-10-28 | Discharge: 2020-10-28 | Disposition: A | Discharge status: Home / Self Care | Payer: Medicare PPO | Source: Ambulatory Visit | Attending: Vascular Surgery | Admitting: Vascular Surgery

## 2020-10-28 DIAGNOSIS — R600 Localized edema: Secondary | ICD-10-CM

## 2020-12-23 ENCOUNTER — Other Ambulatory Visit: Payer: Self-pay | Admitting: Student

## 2020-12-23 DIAGNOSIS — Z982 Presence of cerebrospinal fluid drainage device: Secondary | ICD-10-CM

## 2020-12-26 DIAGNOSIS — E119 Type 2 diabetes mellitus without complications: Secondary | ICD-10-CM | POA: Diagnosis not present

## 2020-12-26 DIAGNOSIS — H547 Unspecified visual loss: Secondary | ICD-10-CM | POA: Diagnosis not present

## 2020-12-26 DIAGNOSIS — E785 Hyperlipidemia, unspecified: Secondary | ICD-10-CM | POA: Diagnosis not present

## 2020-12-26 DIAGNOSIS — Z7984 Long term (current) use of oral hypoglycemic drugs: Secondary | ICD-10-CM | POA: Diagnosis not present

## 2020-12-26 DIAGNOSIS — I10 Essential (primary) hypertension: Secondary | ICD-10-CM | POA: Diagnosis not present

## 2020-12-26 DIAGNOSIS — H9193 Unspecified hearing loss, bilateral: Secondary | ICD-10-CM | POA: Diagnosis not present

## 2020-12-26 DIAGNOSIS — N400 Enlarged prostate without lower urinary tract symptoms: Secondary | ICD-10-CM | POA: Diagnosis not present

## 2020-12-26 DIAGNOSIS — G910 Communicating hydrocephalus: Secondary | ICD-10-CM | POA: Diagnosis not present

## 2020-12-26 DIAGNOSIS — K219 Gastro-esophageal reflux disease without esophagitis: Secondary | ICD-10-CM | POA: Diagnosis not present

## 2020-12-30 DIAGNOSIS — I10 Essential (primary) hypertension: Secondary | ICD-10-CM | POA: Diagnosis not present

## 2020-12-30 DIAGNOSIS — E785 Hyperlipidemia, unspecified: Secondary | ICD-10-CM | POA: Diagnosis not present

## 2020-12-30 DIAGNOSIS — H547 Unspecified visual loss: Secondary | ICD-10-CM | POA: Diagnosis not present

## 2020-12-30 DIAGNOSIS — H9193 Unspecified hearing loss, bilateral: Secondary | ICD-10-CM | POA: Diagnosis not present

## 2020-12-30 DIAGNOSIS — Z7984 Long term (current) use of oral hypoglycemic drugs: Secondary | ICD-10-CM | POA: Diagnosis not present

## 2020-12-30 DIAGNOSIS — N400 Enlarged prostate without lower urinary tract symptoms: Secondary | ICD-10-CM | POA: Diagnosis not present

## 2020-12-30 DIAGNOSIS — E119 Type 2 diabetes mellitus without complications: Secondary | ICD-10-CM | POA: Diagnosis not present

## 2020-12-30 DIAGNOSIS — K219 Gastro-esophageal reflux disease without esophagitis: Secondary | ICD-10-CM | POA: Diagnosis not present

## 2020-12-30 DIAGNOSIS — G910 Communicating hydrocephalus: Secondary | ICD-10-CM | POA: Diagnosis not present

## 2021-01-02 DIAGNOSIS — Z7984 Long term (current) use of oral hypoglycemic drugs: Secondary | ICD-10-CM | POA: Diagnosis not present

## 2021-01-02 DIAGNOSIS — I10 Essential (primary) hypertension: Secondary | ICD-10-CM | POA: Diagnosis not present

## 2021-01-02 DIAGNOSIS — E119 Type 2 diabetes mellitus without complications: Secondary | ICD-10-CM | POA: Diagnosis not present

## 2021-01-02 DIAGNOSIS — H547 Unspecified visual loss: Secondary | ICD-10-CM | POA: Diagnosis not present

## 2021-01-02 DIAGNOSIS — G910 Communicating hydrocephalus: Secondary | ICD-10-CM | POA: Diagnosis not present

## 2021-01-02 DIAGNOSIS — E785 Hyperlipidemia, unspecified: Secondary | ICD-10-CM | POA: Diagnosis not present

## 2021-01-02 DIAGNOSIS — H9193 Unspecified hearing loss, bilateral: Secondary | ICD-10-CM | POA: Diagnosis not present

## 2021-01-02 DIAGNOSIS — N400 Enlarged prostate without lower urinary tract symptoms: Secondary | ICD-10-CM | POA: Diagnosis not present

## 2021-01-02 DIAGNOSIS — K219 Gastro-esophageal reflux disease without esophagitis: Secondary | ICD-10-CM | POA: Diagnosis not present

## 2021-01-05 ENCOUNTER — Ambulatory Visit
Admission: RE | Admit: 2021-01-05 | Discharge: 2021-01-05 | Disposition: A | Discharge status: Home / Self Care | Payer: Medicare PPO | Source: Ambulatory Visit | Attending: Student | Admitting: Student

## 2021-01-05 ENCOUNTER — Other Ambulatory Visit: Payer: Self-pay

## 2021-01-05 DIAGNOSIS — S0990XA Unspecified injury of head, initial encounter: Secondary | ICD-10-CM | POA: Diagnosis not present

## 2021-01-05 DIAGNOSIS — I672 Cerebral atherosclerosis: Secondary | ICD-10-CM | POA: Diagnosis not present

## 2021-01-05 DIAGNOSIS — Z982 Presence of cerebrospinal fluid drainage device: Secondary | ICD-10-CM

## 2021-01-05 DIAGNOSIS — G319 Degenerative disease of nervous system, unspecified: Secondary | ICD-10-CM | POA: Diagnosis not present

## 2021-01-05 DIAGNOSIS — I6200 Nontraumatic subdural hemorrhage, unspecified: Secondary | ICD-10-CM | POA: Diagnosis not present

## 2021-01-06 DIAGNOSIS — Z982 Presence of cerebrospinal fluid drainage device: Secondary | ICD-10-CM | POA: Diagnosis not present

## 2021-01-06 DIAGNOSIS — I10 Essential (primary) hypertension: Secondary | ICD-10-CM | POA: Diagnosis not present

## 2021-01-07 DIAGNOSIS — E119 Type 2 diabetes mellitus without complications: Secondary | ICD-10-CM | POA: Diagnosis not present

## 2021-01-07 DIAGNOSIS — H547 Unspecified visual loss: Secondary | ICD-10-CM | POA: Diagnosis not present

## 2021-01-07 DIAGNOSIS — H9193 Unspecified hearing loss, bilateral: Secondary | ICD-10-CM | POA: Diagnosis not present

## 2021-01-07 DIAGNOSIS — K219 Gastro-esophageal reflux disease without esophagitis: Secondary | ICD-10-CM | POA: Diagnosis not present

## 2021-01-07 DIAGNOSIS — I10 Essential (primary) hypertension: Secondary | ICD-10-CM | POA: Diagnosis not present

## 2021-01-07 DIAGNOSIS — Z7984 Long term (current) use of oral hypoglycemic drugs: Secondary | ICD-10-CM | POA: Diagnosis not present

## 2021-01-07 DIAGNOSIS — E785 Hyperlipidemia, unspecified: Secondary | ICD-10-CM | POA: Diagnosis not present

## 2021-01-07 DIAGNOSIS — N400 Enlarged prostate without lower urinary tract symptoms: Secondary | ICD-10-CM | POA: Diagnosis not present

## 2021-01-07 DIAGNOSIS — G910 Communicating hydrocephalus: Secondary | ICD-10-CM | POA: Diagnosis not present

## 2021-01-08 ENCOUNTER — Other Ambulatory Visit: Payer: Self-pay | Admitting: Neurosurgery

## 2021-01-08 DIAGNOSIS — E119 Type 2 diabetes mellitus without complications: Secondary | ICD-10-CM | POA: Diagnosis not present

## 2021-01-08 DIAGNOSIS — E785 Hyperlipidemia, unspecified: Secondary | ICD-10-CM | POA: Diagnosis not present

## 2021-01-08 DIAGNOSIS — H547 Unspecified visual loss: Secondary | ICD-10-CM | POA: Diagnosis not present

## 2021-01-08 DIAGNOSIS — I10 Essential (primary) hypertension: Secondary | ICD-10-CM | POA: Diagnosis not present

## 2021-01-08 DIAGNOSIS — K219 Gastro-esophageal reflux disease without esophagitis: Secondary | ICD-10-CM | POA: Diagnosis not present

## 2021-01-08 DIAGNOSIS — Z982 Presence of cerebrospinal fluid drainage device: Secondary | ICD-10-CM | POA: Diagnosis not present

## 2021-01-08 DIAGNOSIS — H9193 Unspecified hearing loss, bilateral: Secondary | ICD-10-CM | POA: Diagnosis not present

## 2021-01-08 DIAGNOSIS — N400 Enlarged prostate without lower urinary tract symptoms: Secondary | ICD-10-CM | POA: Diagnosis not present

## 2021-01-08 DIAGNOSIS — Z7984 Long term (current) use of oral hypoglycemic drugs: Secondary | ICD-10-CM | POA: Diagnosis not present

## 2021-01-08 DIAGNOSIS — G910 Communicating hydrocephalus: Secondary | ICD-10-CM | POA: Diagnosis not present

## 2021-01-09 DIAGNOSIS — H9193 Unspecified hearing loss, bilateral: Secondary | ICD-10-CM | POA: Diagnosis not present

## 2021-01-09 DIAGNOSIS — N400 Enlarged prostate without lower urinary tract symptoms: Secondary | ICD-10-CM | POA: Diagnosis not present

## 2021-01-09 DIAGNOSIS — Z7984 Long term (current) use of oral hypoglycemic drugs: Secondary | ICD-10-CM | POA: Diagnosis not present

## 2021-01-09 DIAGNOSIS — E785 Hyperlipidemia, unspecified: Secondary | ICD-10-CM | POA: Diagnosis not present

## 2021-01-09 DIAGNOSIS — H547 Unspecified visual loss: Secondary | ICD-10-CM | POA: Diagnosis not present

## 2021-01-09 DIAGNOSIS — G910 Communicating hydrocephalus: Secondary | ICD-10-CM | POA: Diagnosis not present

## 2021-01-09 DIAGNOSIS — K219 Gastro-esophageal reflux disease without esophagitis: Secondary | ICD-10-CM | POA: Diagnosis not present

## 2021-01-09 DIAGNOSIS — I10 Essential (primary) hypertension: Secondary | ICD-10-CM | POA: Diagnosis not present

## 2021-01-09 DIAGNOSIS — E119 Type 2 diabetes mellitus without complications: Secondary | ICD-10-CM | POA: Diagnosis not present

## 2021-01-12 DIAGNOSIS — Z7984 Long term (current) use of oral hypoglycemic drugs: Secondary | ICD-10-CM | POA: Diagnosis not present

## 2021-01-12 DIAGNOSIS — N400 Enlarged prostate without lower urinary tract symptoms: Secondary | ICD-10-CM | POA: Diagnosis not present

## 2021-01-12 DIAGNOSIS — E119 Type 2 diabetes mellitus without complications: Secondary | ICD-10-CM | POA: Diagnosis not present

## 2021-01-12 DIAGNOSIS — I10 Essential (primary) hypertension: Secondary | ICD-10-CM | POA: Diagnosis not present

## 2021-01-12 DIAGNOSIS — K219 Gastro-esophageal reflux disease without esophagitis: Secondary | ICD-10-CM | POA: Diagnosis not present

## 2021-01-12 DIAGNOSIS — G910 Communicating hydrocephalus: Secondary | ICD-10-CM | POA: Diagnosis not present

## 2021-01-12 DIAGNOSIS — H9193 Unspecified hearing loss, bilateral: Secondary | ICD-10-CM | POA: Diagnosis not present

## 2021-01-12 DIAGNOSIS — H547 Unspecified visual loss: Secondary | ICD-10-CM | POA: Diagnosis not present

## 2021-01-12 DIAGNOSIS — E785 Hyperlipidemia, unspecified: Secondary | ICD-10-CM | POA: Diagnosis not present

## 2021-01-14 DIAGNOSIS — G910 Communicating hydrocephalus: Secondary | ICD-10-CM | POA: Diagnosis not present

## 2021-01-14 DIAGNOSIS — K219 Gastro-esophageal reflux disease without esophagitis: Secondary | ICD-10-CM | POA: Diagnosis not present

## 2021-01-14 DIAGNOSIS — I10 Essential (primary) hypertension: Secondary | ICD-10-CM | POA: Diagnosis not present

## 2021-01-14 DIAGNOSIS — E785 Hyperlipidemia, unspecified: Secondary | ICD-10-CM | POA: Diagnosis not present

## 2021-01-14 DIAGNOSIS — Z7984 Long term (current) use of oral hypoglycemic drugs: Secondary | ICD-10-CM | POA: Diagnosis not present

## 2021-01-14 DIAGNOSIS — H9193 Unspecified hearing loss, bilateral: Secondary | ICD-10-CM | POA: Diagnosis not present

## 2021-01-14 DIAGNOSIS — E119 Type 2 diabetes mellitus without complications: Secondary | ICD-10-CM | POA: Diagnosis not present

## 2021-01-14 DIAGNOSIS — H547 Unspecified visual loss: Secondary | ICD-10-CM | POA: Diagnosis not present

## 2021-01-14 DIAGNOSIS — N400 Enlarged prostate without lower urinary tract symptoms: Secondary | ICD-10-CM | POA: Diagnosis not present

## 2021-01-20 DIAGNOSIS — G910 Communicating hydrocephalus: Secondary | ICD-10-CM | POA: Diagnosis not present

## 2021-01-20 DIAGNOSIS — I10 Essential (primary) hypertension: Secondary | ICD-10-CM | POA: Diagnosis not present

## 2021-01-20 DIAGNOSIS — K219 Gastro-esophageal reflux disease without esophagitis: Secondary | ICD-10-CM | POA: Diagnosis not present

## 2021-01-20 DIAGNOSIS — N400 Enlarged prostate without lower urinary tract symptoms: Secondary | ICD-10-CM | POA: Diagnosis not present

## 2021-01-20 DIAGNOSIS — E119 Type 2 diabetes mellitus without complications: Secondary | ICD-10-CM | POA: Diagnosis not present

## 2021-01-20 DIAGNOSIS — H9193 Unspecified hearing loss, bilateral: Secondary | ICD-10-CM | POA: Diagnosis not present

## 2021-01-20 DIAGNOSIS — Z7984 Long term (current) use of oral hypoglycemic drugs: Secondary | ICD-10-CM | POA: Diagnosis not present

## 2021-01-20 DIAGNOSIS — E785 Hyperlipidemia, unspecified: Secondary | ICD-10-CM | POA: Diagnosis not present

## 2021-01-20 DIAGNOSIS — H547 Unspecified visual loss: Secondary | ICD-10-CM | POA: Diagnosis not present

## 2021-01-22 DIAGNOSIS — G910 Communicating hydrocephalus: Secondary | ICD-10-CM | POA: Diagnosis not present

## 2021-01-22 DIAGNOSIS — Z7984 Long term (current) use of oral hypoglycemic drugs: Secondary | ICD-10-CM | POA: Diagnosis not present

## 2021-01-22 DIAGNOSIS — N400 Enlarged prostate without lower urinary tract symptoms: Secondary | ICD-10-CM | POA: Diagnosis not present

## 2021-01-22 DIAGNOSIS — H547 Unspecified visual loss: Secondary | ICD-10-CM | POA: Diagnosis not present

## 2021-01-22 DIAGNOSIS — K219 Gastro-esophageal reflux disease without esophagitis: Secondary | ICD-10-CM | POA: Diagnosis not present

## 2021-01-22 DIAGNOSIS — E119 Type 2 diabetes mellitus without complications: Secondary | ICD-10-CM | POA: Diagnosis not present

## 2021-01-22 DIAGNOSIS — H9193 Unspecified hearing loss, bilateral: Secondary | ICD-10-CM | POA: Diagnosis not present

## 2021-01-22 DIAGNOSIS — E785 Hyperlipidemia, unspecified: Secondary | ICD-10-CM | POA: Diagnosis not present

## 2021-01-22 DIAGNOSIS — I10 Essential (primary) hypertension: Secondary | ICD-10-CM | POA: Diagnosis not present

## 2021-01-25 DIAGNOSIS — I10 Essential (primary) hypertension: Secondary | ICD-10-CM | POA: Diagnosis not present

## 2021-01-25 DIAGNOSIS — H547 Unspecified visual loss: Secondary | ICD-10-CM | POA: Diagnosis not present

## 2021-01-25 DIAGNOSIS — E119 Type 2 diabetes mellitus without complications: Secondary | ICD-10-CM | POA: Diagnosis not present

## 2021-01-25 DIAGNOSIS — G910 Communicating hydrocephalus: Secondary | ICD-10-CM | POA: Diagnosis not present

## 2021-01-25 DIAGNOSIS — Z7984 Long term (current) use of oral hypoglycemic drugs: Secondary | ICD-10-CM | POA: Diagnosis not present

## 2021-01-25 DIAGNOSIS — H9193 Unspecified hearing loss, bilateral: Secondary | ICD-10-CM | POA: Diagnosis not present

## 2021-01-25 DIAGNOSIS — N400 Enlarged prostate without lower urinary tract symptoms: Secondary | ICD-10-CM | POA: Diagnosis not present

## 2021-01-25 DIAGNOSIS — K219 Gastro-esophageal reflux disease without esophagitis: Secondary | ICD-10-CM | POA: Diagnosis not present

## 2021-01-25 DIAGNOSIS — E785 Hyperlipidemia, unspecified: Secondary | ICD-10-CM | POA: Diagnosis not present

## 2021-01-27 DIAGNOSIS — H9193 Unspecified hearing loss, bilateral: Secondary | ICD-10-CM | POA: Diagnosis not present

## 2021-01-27 DIAGNOSIS — I10 Essential (primary) hypertension: Secondary | ICD-10-CM | POA: Diagnosis not present

## 2021-01-27 DIAGNOSIS — Z7984 Long term (current) use of oral hypoglycemic drugs: Secondary | ICD-10-CM | POA: Diagnosis not present

## 2021-01-27 DIAGNOSIS — E785 Hyperlipidemia, unspecified: Secondary | ICD-10-CM | POA: Diagnosis not present

## 2021-01-27 DIAGNOSIS — N400 Enlarged prostate without lower urinary tract symptoms: Secondary | ICD-10-CM | POA: Diagnosis not present

## 2021-01-27 DIAGNOSIS — G910 Communicating hydrocephalus: Secondary | ICD-10-CM | POA: Diagnosis not present

## 2021-01-27 DIAGNOSIS — H547 Unspecified visual loss: Secondary | ICD-10-CM | POA: Diagnosis not present

## 2021-01-27 DIAGNOSIS — K219 Gastro-esophageal reflux disease without esophagitis: Secondary | ICD-10-CM | POA: Diagnosis not present

## 2021-01-27 DIAGNOSIS — E119 Type 2 diabetes mellitus without complications: Secondary | ICD-10-CM | POA: Diagnosis not present

## 2021-01-28 DIAGNOSIS — H547 Unspecified visual loss: Secondary | ICD-10-CM | POA: Diagnosis not present

## 2021-01-28 DIAGNOSIS — E785 Hyperlipidemia, unspecified: Secondary | ICD-10-CM | POA: Diagnosis not present

## 2021-01-28 DIAGNOSIS — N400 Enlarged prostate without lower urinary tract symptoms: Secondary | ICD-10-CM | POA: Diagnosis not present

## 2021-01-28 DIAGNOSIS — Z7984 Long term (current) use of oral hypoglycemic drugs: Secondary | ICD-10-CM | POA: Diagnosis not present

## 2021-01-28 DIAGNOSIS — H9193 Unspecified hearing loss, bilateral: Secondary | ICD-10-CM | POA: Diagnosis not present

## 2021-01-28 DIAGNOSIS — K219 Gastro-esophageal reflux disease without esophagitis: Secondary | ICD-10-CM | POA: Diagnosis not present

## 2021-01-28 DIAGNOSIS — I10 Essential (primary) hypertension: Secondary | ICD-10-CM | POA: Diagnosis not present

## 2021-01-28 DIAGNOSIS — E119 Type 2 diabetes mellitus without complications: Secondary | ICD-10-CM | POA: Diagnosis not present

## 2021-01-28 DIAGNOSIS — G910 Communicating hydrocephalus: Secondary | ICD-10-CM | POA: Diagnosis not present

## 2021-01-29 DIAGNOSIS — H9193 Unspecified hearing loss, bilateral: Secondary | ICD-10-CM | POA: Diagnosis not present

## 2021-01-29 DIAGNOSIS — K219 Gastro-esophageal reflux disease without esophagitis: Secondary | ICD-10-CM | POA: Diagnosis not present

## 2021-01-29 DIAGNOSIS — N400 Enlarged prostate without lower urinary tract symptoms: Secondary | ICD-10-CM | POA: Diagnosis not present

## 2021-01-29 DIAGNOSIS — I10 Essential (primary) hypertension: Secondary | ICD-10-CM | POA: Diagnosis not present

## 2021-01-29 DIAGNOSIS — E785 Hyperlipidemia, unspecified: Secondary | ICD-10-CM | POA: Diagnosis not present

## 2021-01-29 DIAGNOSIS — G910 Communicating hydrocephalus: Secondary | ICD-10-CM | POA: Diagnosis not present

## 2021-01-29 DIAGNOSIS — E119 Type 2 diabetes mellitus without complications: Secondary | ICD-10-CM | POA: Diagnosis not present

## 2021-01-29 DIAGNOSIS — H547 Unspecified visual loss: Secondary | ICD-10-CM | POA: Diagnosis not present

## 2021-01-29 DIAGNOSIS — Z7984 Long term (current) use of oral hypoglycemic drugs: Secondary | ICD-10-CM | POA: Diagnosis not present

## 2021-02-03 DIAGNOSIS — H906 Mixed conductive and sensorineural hearing loss, bilateral: Secondary | ICD-10-CM | POA: Diagnosis not present

## 2021-02-03 DIAGNOSIS — E785 Hyperlipidemia, unspecified: Secondary | ICD-10-CM | POA: Diagnosis not present

## 2021-02-03 DIAGNOSIS — H9193 Unspecified hearing loss, bilateral: Secondary | ICD-10-CM | POA: Diagnosis not present

## 2021-02-03 DIAGNOSIS — G910 Communicating hydrocephalus: Secondary | ICD-10-CM | POA: Diagnosis not present

## 2021-02-03 DIAGNOSIS — I10 Essential (primary) hypertension: Secondary | ICD-10-CM | POA: Diagnosis not present

## 2021-02-03 DIAGNOSIS — E119 Type 2 diabetes mellitus without complications: Secondary | ICD-10-CM | POA: Diagnosis not present

## 2021-02-03 DIAGNOSIS — N400 Enlarged prostate without lower urinary tract symptoms: Secondary | ICD-10-CM | POA: Diagnosis not present

## 2021-02-03 DIAGNOSIS — H6501 Acute serous otitis media, right ear: Secondary | ICD-10-CM | POA: Diagnosis not present

## 2021-02-03 DIAGNOSIS — H547 Unspecified visual loss: Secondary | ICD-10-CM | POA: Diagnosis not present

## 2021-02-03 DIAGNOSIS — K219 Gastro-esophageal reflux disease without esophagitis: Secondary | ICD-10-CM | POA: Diagnosis not present

## 2021-02-03 DIAGNOSIS — H6123 Impacted cerumen, bilateral: Secondary | ICD-10-CM | POA: Diagnosis not present

## 2021-02-03 DIAGNOSIS — Z7984 Long term (current) use of oral hypoglycemic drugs: Secondary | ICD-10-CM | POA: Diagnosis not present

## 2021-02-04 DIAGNOSIS — H9193 Unspecified hearing loss, bilateral: Secondary | ICD-10-CM | POA: Diagnosis not present

## 2021-02-04 DIAGNOSIS — K219 Gastro-esophageal reflux disease without esophagitis: Secondary | ICD-10-CM | POA: Diagnosis not present

## 2021-02-04 DIAGNOSIS — H547 Unspecified visual loss: Secondary | ICD-10-CM | POA: Diagnosis not present

## 2021-02-04 DIAGNOSIS — E119 Type 2 diabetes mellitus without complications: Secondary | ICD-10-CM | POA: Diagnosis not present

## 2021-02-04 DIAGNOSIS — Z7984 Long term (current) use of oral hypoglycemic drugs: Secondary | ICD-10-CM | POA: Diagnosis not present

## 2021-02-04 DIAGNOSIS — N400 Enlarged prostate without lower urinary tract symptoms: Secondary | ICD-10-CM | POA: Diagnosis not present

## 2021-02-04 DIAGNOSIS — I10 Essential (primary) hypertension: Secondary | ICD-10-CM | POA: Diagnosis not present

## 2021-02-04 DIAGNOSIS — E785 Hyperlipidemia, unspecified: Secondary | ICD-10-CM | POA: Diagnosis not present

## 2021-02-04 DIAGNOSIS — G910 Communicating hydrocephalus: Secondary | ICD-10-CM | POA: Diagnosis not present

## 2021-02-05 ENCOUNTER — Other Ambulatory Visit: Payer: Medicare PPO

## 2021-02-05 ENCOUNTER — Ambulatory Visit
Admission: RE | Admit: 2021-02-05 | Discharge: 2021-02-05 | Disposition: A | Discharge status: Home / Self Care | Payer: Medicare PPO | Source: Ambulatory Visit | Attending: Neurosurgery | Admitting: Neurosurgery

## 2021-02-05 DIAGNOSIS — G910 Communicating hydrocephalus: Secondary | ICD-10-CM | POA: Diagnosis not present

## 2021-02-05 DIAGNOSIS — H748X3 Other specified disorders of middle ear and mastoid, bilateral: Secondary | ICD-10-CM | POA: Diagnosis not present

## 2021-02-05 DIAGNOSIS — I6200 Nontraumatic subdural hemorrhage, unspecified: Secondary | ICD-10-CM | POA: Diagnosis not present

## 2021-02-05 DIAGNOSIS — Z982 Presence of cerebrospinal fluid drainage device: Secondary | ICD-10-CM

## 2021-02-05 DIAGNOSIS — Z87828 Personal history of other (healed) physical injury and trauma: Secondary | ICD-10-CM | POA: Diagnosis not present

## 2021-02-05 DIAGNOSIS — G9389 Other specified disorders of brain: Secondary | ICD-10-CM | POA: Diagnosis not present

## 2021-02-13 DIAGNOSIS — K219 Gastro-esophageal reflux disease without esophagitis: Secondary | ICD-10-CM | POA: Diagnosis not present

## 2021-02-13 DIAGNOSIS — H547 Unspecified visual loss: Secondary | ICD-10-CM | POA: Diagnosis not present

## 2021-02-13 DIAGNOSIS — I10 Essential (primary) hypertension: Secondary | ICD-10-CM | POA: Diagnosis not present

## 2021-02-13 DIAGNOSIS — Z7984 Long term (current) use of oral hypoglycemic drugs: Secondary | ICD-10-CM | POA: Diagnosis not present

## 2021-02-13 DIAGNOSIS — E785 Hyperlipidemia, unspecified: Secondary | ICD-10-CM | POA: Diagnosis not present

## 2021-02-13 DIAGNOSIS — E119 Type 2 diabetes mellitus without complications: Secondary | ICD-10-CM | POA: Diagnosis not present

## 2021-02-13 DIAGNOSIS — H9193 Unspecified hearing loss, bilateral: Secondary | ICD-10-CM | POA: Diagnosis not present

## 2021-02-13 DIAGNOSIS — N400 Enlarged prostate without lower urinary tract symptoms: Secondary | ICD-10-CM | POA: Diagnosis not present

## 2021-02-13 DIAGNOSIS — G910 Communicating hydrocephalus: Secondary | ICD-10-CM | POA: Diagnosis not present

## 2021-02-16 DIAGNOSIS — Z85828 Personal history of other malignant neoplasm of skin: Secondary | ICD-10-CM | POA: Diagnosis not present

## 2021-02-16 DIAGNOSIS — L821 Other seborrheic keratosis: Secondary | ICD-10-CM | POA: Diagnosis not present

## 2021-02-16 DIAGNOSIS — L570 Actinic keratosis: Secondary | ICD-10-CM | POA: Diagnosis not present

## 2021-02-23 DIAGNOSIS — I10 Essential (primary) hypertension: Secondary | ICD-10-CM | POA: Diagnosis not present

## 2021-02-23 DIAGNOSIS — G912 (Idiopathic) normal pressure hydrocephalus: Secondary | ICD-10-CM | POA: Diagnosis not present

## 2021-02-23 DIAGNOSIS — K5909 Other constipation: Secondary | ICD-10-CM | POA: Diagnosis not present

## 2021-02-23 DIAGNOSIS — E1169 Type 2 diabetes mellitus with other specified complication: Secondary | ICD-10-CM | POA: Diagnosis not present

## 2021-02-23 DIAGNOSIS — H6690 Otitis media, unspecified, unspecified ear: Secondary | ICD-10-CM | POA: Diagnosis not present

## 2021-02-23 DIAGNOSIS — R5383 Other fatigue: Secondary | ICD-10-CM | POA: Diagnosis not present

## 2021-02-24 ENCOUNTER — Other Ambulatory Visit (HOSPITAL_COMMUNITY): Payer: Self-pay | Admitting: Neurosurgery

## 2021-02-24 ENCOUNTER — Other Ambulatory Visit: Payer: Self-pay | Admitting: Neurosurgery

## 2021-02-24 ENCOUNTER — Other Ambulatory Visit: Payer: Self-pay

## 2021-02-24 ENCOUNTER — Ambulatory Visit (HOSPITAL_COMMUNITY)
Admission: RE | Admit: 2021-02-24 | Discharge: 2021-02-24 | Disposition: A | Discharge status: Home / Self Care | Payer: Medicare PPO | Source: Ambulatory Visit | Attending: Neurosurgery | Admitting: Neurosurgery

## 2021-02-24 DIAGNOSIS — G9389 Other specified disorders of brain: Secondary | ICD-10-CM | POA: Diagnosis not present

## 2021-02-24 DIAGNOSIS — Z982 Presence of cerebrospinal fluid drainage device: Secondary | ICD-10-CM | POA: Diagnosis not present

## 2021-02-24 DIAGNOSIS — G910 Communicating hydrocephalus: Secondary | ICD-10-CM | POA: Diagnosis not present

## 2021-02-25 DIAGNOSIS — H6501 Acute serous otitis media, right ear: Secondary | ICD-10-CM | POA: Diagnosis not present

## 2021-02-26 DIAGNOSIS — Z982 Presence of cerebrospinal fluid drainage device: Secondary | ICD-10-CM | POA: Diagnosis not present

## 2021-02-26 DIAGNOSIS — G910 Communicating hydrocephalus: Secondary | ICD-10-CM | POA: Diagnosis not present

## 2021-02-27 DIAGNOSIS — R739 Hyperglycemia, unspecified: Secondary | ICD-10-CM | POA: Diagnosis not present

## 2021-02-27 DIAGNOSIS — K5900 Constipation, unspecified: Secondary | ICD-10-CM | POA: Diagnosis not present

## 2021-02-27 DIAGNOSIS — E1169 Type 2 diabetes mellitus with other specified complication: Secondary | ICD-10-CM | POA: Diagnosis not present

## 2021-02-27 DIAGNOSIS — I10 Essential (primary) hypertension: Secondary | ICD-10-CM | POA: Diagnosis not present

## 2021-02-27 DIAGNOSIS — R5383 Other fatigue: Secondary | ICD-10-CM | POA: Diagnosis not present

## 2021-02-27 DIAGNOSIS — G912 (Idiopathic) normal pressure hydrocephalus: Secondary | ICD-10-CM | POA: Diagnosis not present

## 2021-03-09 ENCOUNTER — Other Ambulatory Visit (HOSPITAL_COMMUNITY): Payer: Self-pay | Admitting: Student

## 2021-03-09 ENCOUNTER — Other Ambulatory Visit: Payer: Self-pay | Admitting: Student

## 2021-03-09 DIAGNOSIS — G910 Communicating hydrocephalus: Secondary | ICD-10-CM

## 2021-03-16 ENCOUNTER — Other Ambulatory Visit: Payer: Self-pay

## 2021-03-16 ENCOUNTER — Ambulatory Visit (HOSPITAL_COMMUNITY)
Admission: RE | Admit: 2021-03-16 | Discharge: 2021-03-16 | Disposition: A | Discharge status: Home / Self Care | Payer: Medicare PPO | Source: Ambulatory Visit | Attending: Student | Admitting: Student

## 2021-03-16 DIAGNOSIS — G319 Degenerative disease of nervous system, unspecified: Secondary | ICD-10-CM | POA: Diagnosis not present

## 2021-03-16 DIAGNOSIS — G9389 Other specified disorders of brain: Secondary | ICD-10-CM | POA: Diagnosis not present

## 2021-03-16 DIAGNOSIS — R9082 White matter disease, unspecified: Secondary | ICD-10-CM | POA: Diagnosis not present

## 2021-03-16 DIAGNOSIS — G910 Communicating hydrocephalus: Secondary | ICD-10-CM | POA: Insufficient documentation

## 2021-03-16 DIAGNOSIS — I6782 Cerebral ischemia: Secondary | ICD-10-CM | POA: Diagnosis not present

## 2021-03-19 DIAGNOSIS — G910 Communicating hydrocephalus: Secondary | ICD-10-CM | POA: Diagnosis not present

## 2021-03-19 DIAGNOSIS — R269 Unspecified abnormalities of gait and mobility: Secondary | ICD-10-CM | POA: Diagnosis not present

## 2021-03-19 DIAGNOSIS — R531 Weakness: Secondary | ICD-10-CM | POA: Diagnosis not present

## 2021-03-19 DIAGNOSIS — N390 Urinary tract infection, site not specified: Secondary | ICD-10-CM | POA: Diagnosis not present

## 2021-03-19 DIAGNOSIS — G912 (Idiopathic) normal pressure hydrocephalus: Secondary | ICD-10-CM | POA: Diagnosis not present

## 2021-03-19 DIAGNOSIS — E1169 Type 2 diabetes mellitus with other specified complication: Secondary | ICD-10-CM | POA: Diagnosis not present

## 2021-03-19 DIAGNOSIS — I639 Cerebral infarction, unspecified: Secondary | ICD-10-CM | POA: Diagnosis not present

## 2021-03-25 DIAGNOSIS — C760 Malignant neoplasm of head, face and neck: Secondary | ICD-10-CM | POA: Diagnosis not present

## 2021-03-25 DIAGNOSIS — H6690 Otitis media, unspecified, unspecified ear: Secondary | ICD-10-CM | POA: Diagnosis not present

## 2021-03-25 DIAGNOSIS — N400 Enlarged prostate without lower urinary tract symptoms: Secondary | ICD-10-CM | POA: Diagnosis not present

## 2021-03-25 DIAGNOSIS — G912 (Idiopathic) normal pressure hydrocephalus: Secondary | ICD-10-CM | POA: Diagnosis not present

## 2021-03-25 DIAGNOSIS — M8580 Other specified disorders of bone density and structure, unspecified site: Secondary | ICD-10-CM | POA: Diagnosis not present

## 2021-03-25 DIAGNOSIS — N390 Urinary tract infection, site not specified: Secondary | ICD-10-CM | POA: Diagnosis not present

## 2021-03-25 DIAGNOSIS — E119 Type 2 diabetes mellitus without complications: Secondary | ICD-10-CM | POA: Diagnosis not present

## 2021-03-25 DIAGNOSIS — I10 Essential (primary) hypertension: Secondary | ICD-10-CM | POA: Diagnosis not present

## 2021-03-25 DIAGNOSIS — H9193 Unspecified hearing loss, bilateral: Secondary | ICD-10-CM | POA: Diagnosis not present

## 2021-03-27 ENCOUNTER — Emergency Department (HOSPITAL_COMMUNITY): Payer: Medicare PPO

## 2021-03-27 ENCOUNTER — Inpatient Hospital Stay (HOSPITAL_COMMUNITY)
Admission: EM | Admit: 2021-03-27 | Discharge: 2021-04-22 | DRG: 071 | Disposition: E | Payer: Medicare PPO | Attending: Internal Medicine | Admitting: Internal Medicine

## 2021-03-27 ENCOUNTER — Encounter (HOSPITAL_COMMUNITY): Payer: Self-pay

## 2021-03-27 ENCOUNTER — Other Ambulatory Visit: Payer: Self-pay

## 2021-03-27 DIAGNOSIS — R0600 Dyspnea, unspecified: Secondary | ICD-10-CM

## 2021-03-27 DIAGNOSIS — E785 Hyperlipidemia, unspecified: Secondary | ICD-10-CM | POA: Diagnosis present

## 2021-03-27 DIAGNOSIS — G919 Hydrocephalus, unspecified: Secondary | ICD-10-CM | POA: Diagnosis not present

## 2021-03-27 DIAGNOSIS — R64 Cachexia: Secondary | ICD-10-CM | POA: Diagnosis present

## 2021-03-27 DIAGNOSIS — E870 Hyperosmolality and hypernatremia: Secondary | ICD-10-CM

## 2021-03-27 DIAGNOSIS — A419 Sepsis, unspecified organism: Secondary | ICD-10-CM | POA: Diagnosis not present

## 2021-03-27 DIAGNOSIS — J9811 Atelectasis: Secondary | ICD-10-CM | POA: Diagnosis not present

## 2021-03-27 DIAGNOSIS — N3000 Acute cystitis without hematuria: Secondary | ICD-10-CM | POA: Diagnosis not present

## 2021-03-27 DIAGNOSIS — Z7401 Bed confinement status: Secondary | ICD-10-CM

## 2021-03-27 DIAGNOSIS — I10 Essential (primary) hypertension: Secondary | ICD-10-CM

## 2021-03-27 DIAGNOSIS — R6521 Severe sepsis with septic shock: Secondary | ICD-10-CM | POA: Diagnosis not present

## 2021-03-27 DIAGNOSIS — N390 Urinary tract infection, site not specified: Secondary | ICD-10-CM | POA: Diagnosis not present

## 2021-03-27 DIAGNOSIS — Z86008 Personal history of in-situ neoplasm of other site: Secondary | ICD-10-CM

## 2021-03-27 DIAGNOSIS — F0390 Unspecified dementia without behavioral disturbance: Secondary | ICD-10-CM | POA: Diagnosis present

## 2021-03-27 DIAGNOSIS — R627 Adult failure to thrive: Secondary | ICD-10-CM | POA: Diagnosis present

## 2021-03-27 DIAGNOSIS — R1310 Dysphagia, unspecified: Secondary | ICD-10-CM

## 2021-03-27 DIAGNOSIS — Z87891 Personal history of nicotine dependence: Secondary | ICD-10-CM

## 2021-03-27 DIAGNOSIS — Z66 Do not resuscitate: Secondary | ICD-10-CM | POA: Diagnosis present

## 2021-03-27 DIAGNOSIS — C760 Malignant neoplasm of head, face and neck: Secondary | ICD-10-CM | POA: Diagnosis not present

## 2021-03-27 DIAGNOSIS — E860 Dehydration: Secondary | ICD-10-CM | POA: Diagnosis present

## 2021-03-27 DIAGNOSIS — R404 Transient alteration of awareness: Secondary | ICD-10-CM | POA: Diagnosis not present

## 2021-03-27 DIAGNOSIS — K219 Gastro-esophageal reflux disease without esophagitis: Secondary | ICD-10-CM | POA: Diagnosis present

## 2021-03-27 DIAGNOSIS — J3800 Paralysis of vocal cords and larynx, unspecified: Secondary | ICD-10-CM | POA: Diagnosis present

## 2021-03-27 DIAGNOSIS — R0902 Hypoxemia: Secondary | ICD-10-CM | POA: Diagnosis not present

## 2021-03-27 DIAGNOSIS — D696 Thrombocytopenia, unspecified: Secondary | ICD-10-CM | POA: Diagnosis not present

## 2021-03-27 DIAGNOSIS — Z515 Encounter for palliative care: Secondary | ICD-10-CM | POA: Diagnosis not present

## 2021-03-27 DIAGNOSIS — R339 Retention of urine, unspecified: Secondary | ICD-10-CM | POA: Diagnosis present

## 2021-03-27 DIAGNOSIS — Z20822 Contact with and (suspected) exposure to covid-19: Secondary | ICD-10-CM | POA: Diagnosis present

## 2021-03-27 DIAGNOSIS — E872 Acidosis: Secondary | ICD-10-CM | POA: Diagnosis present

## 2021-03-27 DIAGNOSIS — H9190 Unspecified hearing loss, unspecified ear: Secondary | ICD-10-CM | POA: Diagnosis present

## 2021-03-27 DIAGNOSIS — G9341 Metabolic encephalopathy: Principal | ICD-10-CM | POA: Diagnosis present

## 2021-03-27 DIAGNOSIS — I6203 Nontraumatic chronic subdural hemorrhage: Secondary | ICD-10-CM | POA: Diagnosis present

## 2021-03-27 DIAGNOSIS — R4182 Altered mental status, unspecified: Secondary | ICD-10-CM | POA: Diagnosis not present

## 2021-03-27 DIAGNOSIS — E876 Hypokalemia: Secondary | ICD-10-CM | POA: Diagnosis present

## 2021-03-27 DIAGNOSIS — E1165 Type 2 diabetes mellitus with hyperglycemia: Secondary | ICD-10-CM | POA: Diagnosis present

## 2021-03-27 DIAGNOSIS — G910 Communicating hydrocephalus: Secondary | ICD-10-CM | POA: Diagnosis present

## 2021-03-27 DIAGNOSIS — H6690 Otitis media, unspecified, unspecified ear: Secondary | ICD-10-CM | POA: Diagnosis not present

## 2021-03-27 DIAGNOSIS — Z982 Presence of cerebrospinal fluid drainage device: Secondary | ICD-10-CM

## 2021-03-27 DIAGNOSIS — R059 Cough, unspecified: Secondary | ICD-10-CM | POA: Diagnosis not present

## 2021-03-27 DIAGNOSIS — E538 Deficiency of other specified B group vitamins: Secondary | ICD-10-CM | POA: Diagnosis present

## 2021-03-27 DIAGNOSIS — H9193 Unspecified hearing loss, bilateral: Secondary | ICD-10-CM | POA: Diagnosis not present

## 2021-03-27 DIAGNOSIS — G9389 Other specified disorders of brain: Secondary | ICD-10-CM | POA: Diagnosis not present

## 2021-03-27 DIAGNOSIS — M436 Torticollis: Secondary | ICD-10-CM | POA: Diagnosis present

## 2021-03-27 DIAGNOSIS — E119 Type 2 diabetes mellitus without complications: Secondary | ICD-10-CM | POA: Diagnosis not present

## 2021-03-27 DIAGNOSIS — Z4659 Encounter for fitting and adjustment of other gastrointestinal appliance and device: Secondary | ICD-10-CM | POA: Diagnosis not present

## 2021-03-27 DIAGNOSIS — N400 Enlarged prostate without lower urinary tract symptoms: Secondary | ICD-10-CM | POA: Diagnosis not present

## 2021-03-27 DIAGNOSIS — N179 Acute kidney failure, unspecified: Secondary | ICD-10-CM | POA: Diagnosis not present

## 2021-03-27 DIAGNOSIS — Z7189 Other specified counseling: Secondary | ICD-10-CM | POA: Diagnosis not present

## 2021-03-27 DIAGNOSIS — Z8673 Personal history of transient ischemic attack (TIA), and cerebral infarction without residual deficits: Secondary | ICD-10-CM | POA: Diagnosis not present

## 2021-03-27 DIAGNOSIS — Z79899 Other long term (current) drug therapy: Secondary | ICD-10-CM

## 2021-03-27 DIAGNOSIS — R1312 Dysphagia, oropharyngeal phase: Secondary | ICD-10-CM | POA: Diagnosis present

## 2021-03-27 DIAGNOSIS — G912 (Idiopathic) normal pressure hydrocephalus: Secondary | ICD-10-CM | POA: Diagnosis not present

## 2021-03-27 DIAGNOSIS — N401 Enlarged prostate with lower urinary tract symptoms: Secondary | ICD-10-CM | POA: Diagnosis present

## 2021-03-27 DIAGNOSIS — Z7984 Long term (current) use of oral hypoglycemic drugs: Secondary | ICD-10-CM

## 2021-03-27 DIAGNOSIS — R0689 Other abnormalities of breathing: Secondary | ICD-10-CM | POA: Diagnosis not present

## 2021-03-27 DIAGNOSIS — Z6820 Body mass index (BMI) 20.0-20.9, adult: Secondary | ICD-10-CM

## 2021-03-27 DIAGNOSIS — M8580 Other specified disorders of bone density and structure, unspecified site: Secondary | ICD-10-CM | POA: Diagnosis not present

## 2021-03-27 LAB — CBC WITH DIFFERENTIAL/PLATELET
Abs Immature Granulocytes: 0.23 10*3/uL — ABNORMAL HIGH (ref 0.00–0.07)
Basophils Absolute: 0.1 10*3/uL (ref 0.0–0.1)
Basophils Relative: 1 %
Eosinophils Absolute: 0.2 10*3/uL (ref 0.0–0.5)
Eosinophils Relative: 1 %
HCT: 48.1 % (ref 39.0–52.0)
Hemoglobin: 15.8 g/dL (ref 13.0–17.0)
Immature Granulocytes: 2 %
Lymphocytes Relative: 8 %
Lymphs Abs: 1.2 10*3/uL (ref 0.7–4.0)
MCH: 32.5 pg (ref 26.0–34.0)
MCHC: 32.8 g/dL (ref 30.0–36.0)
MCV: 99 fL (ref 80.0–100.0)
Monocytes Absolute: 0.8 10*3/uL (ref 0.1–1.0)
Monocytes Relative: 6 %
Neutro Abs: 12.2 10*3/uL — ABNORMAL HIGH (ref 1.7–7.7)
Neutrophils Relative %: 82 %
Platelets: 184 10*3/uL (ref 150–400)
RBC: 4.86 MIL/uL (ref 4.22–5.81)
RDW: 13.4 % (ref 11.5–15.5)
WBC: 14.7 10*3/uL — ABNORMAL HIGH (ref 4.0–10.5)
nRBC: 0 % (ref 0.0–0.2)

## 2021-03-27 LAB — URINALYSIS, ROUTINE W REFLEX MICROSCOPIC
Bacteria, UA: NONE SEEN
Bilirubin Urine: NEGATIVE
Glucose, UA: 150 mg/dL — AB
Ketones, ur: 5 mg/dL — AB
Nitrite: NEGATIVE
Protein, ur: 30 mg/dL — AB
RBC / HPF: >50 RBC/hpf — ABNORMAL HIGH (ref 0–5)
Specific Gravity, Urine: 1.02 (ref 1.005–1.030)
WBC, UA: >50 WBC/hpf — ABNORMAL HIGH (ref 0–5)
pH: 5 (ref 5.0–8.0)

## 2021-03-27 LAB — RESP PANEL BY RT-PCR (FLU A&B, COVID) ARPGX2
Influenza A by PCR: NEGATIVE
Influenza B by PCR: NEGATIVE
SARS Coronavirus 2 by RT PCR: NEGATIVE

## 2021-03-27 LAB — COMPREHENSIVE METABOLIC PANEL
ALT: 14 U/L (ref 0–44)
AST: 16 U/L (ref 15–41)
Albumin: 2.6 g/dL — ABNORMAL LOW (ref 3.5–5.0)
Alkaline Phosphatase: 134 U/L — ABNORMAL HIGH (ref 38–126)
Anion gap: 7 (ref 5–15)
BUN: 45 mg/dL — ABNORMAL HIGH (ref 8–23)
CO2: 21 mmol/L — ABNORMAL LOW (ref 22–32)
Calcium: 11.5 mg/dL — ABNORMAL HIGH (ref 8.9–10.3)
Chloride: 122 mmol/L — ABNORMAL HIGH (ref 98–111)
Creatinine, Ser: 1.08 mg/dL (ref 0.61–1.24)
GFR, Estimated: >60 mL/min (ref >60)
Glucose, Bld: 171 mg/dL — ABNORMAL HIGH (ref 70–99)
Potassium: 4.4 mmol/L (ref 3.5–5.1)
Sodium: 150 mmol/L — ABNORMAL HIGH (ref 135–145)
Total Bilirubin: 0.5 mg/dL (ref 0.3–1.2)
Total Protein: 6 g/dL — ABNORMAL LOW (ref 6.5–8.1)

## 2021-03-27 LAB — BASIC METABOLIC PANEL
Anion gap: 7 (ref 5–15)
BUN: 41 mg/dL — ABNORMAL HIGH (ref 8–23)
CO2: 23 mmol/L (ref 22–32)
Calcium: 10.6 mg/dL — ABNORMAL HIGH (ref 8.9–10.3)
Chloride: 121 mmol/L — ABNORMAL HIGH (ref 98–111)
Creatinine, Ser: 1.11 mg/dL (ref 0.61–1.24)
GFR, Estimated: >60 mL/min (ref >60)
Glucose, Bld: 150 mg/dL — ABNORMAL HIGH (ref 70–99)
Potassium: 4.3 mmol/L (ref 3.5–5.1)
Sodium: 151 mmol/L — ABNORMAL HIGH (ref 135–145)

## 2021-03-27 LAB — PROTIME-INR
INR: 1.2 (ref 0.8–1.2)
Prothrombin Time: 14.7 seconds (ref 11.4–15.2)

## 2021-03-27 LAB — LACTIC ACID, PLASMA
Lactic Acid, Venous: 4.1 mmol/L (ref 0.5–1.9)
Lactic Acid, Venous: 5 mmol/L (ref 0.5–1.9)
Lactic Acid, Venous: 3.1 mmol/L (ref 0.5–1.9)
Lactic Acid, Venous: 3.5 mmol/L (ref 0.5–1.9)

## 2021-03-27 MED ORDER — VANCOMYCIN HCL IN DEXTROSE 1-5 GM/200ML-% IV SOLN
1000.0000 mg | Freq: Once | INTRAVENOUS | Status: AC
  Administered 2021-03-27: 1000 mg via INTRAVENOUS
  Filled 2021-03-27: qty 200

## 2021-03-27 MED ORDER — HYDRALAZINE HCL 10 MG PO TABS: 10.0000 mg | ORAL_TABLET | Freq: Three times a day (TID) | ORAL | Status: DC

## 2021-03-27 MED ORDER — SODIUM CHLORIDE 0.9 % IV SOLN
2.0000 g | Freq: Once | INTRAVENOUS | Status: AC
  Administered 2021-03-27: 2 g via INTRAVENOUS
  Filled 2021-03-27: qty 2

## 2021-03-27 MED ORDER — SODIUM CHLORIDE 0.9 % IV SOLN: INTRAVENOUS | Status: DC

## 2021-03-27 MED ORDER — ASPIRIN EC 81 MG PO TBEC: 81.0000 mg | DELAYED_RELEASE_TABLET | Freq: Every day | ORAL | Status: DC

## 2021-03-27 MED ORDER — SODIUM CHLORIDE 0.9 % IV SOLN
1.0000 g | INTRAVENOUS | Status: DC
  Administered 2021-03-27: 1 g via INTRAVENOUS
  Filled 2021-03-27: qty 10

## 2021-03-27 MED ORDER — METRONIDAZOLE 500 MG/100ML IV SOLN
500.0000 mg | Freq: Three times a day (TID) | INTRAVENOUS | Status: DC
  Administered 2021-03-28: 500 mg via INTRAVENOUS
  Filled 2021-03-27: qty 100

## 2021-03-27 MED ORDER — LISINOPRIL 20 MG PO TABS: 40.0000 mg | ORAL_TABLET | Freq: Every day | ORAL | Status: DC

## 2021-03-27 MED ORDER — FLUCONAZOLE 100 MG PO TABS: 100.0000 mg | ORAL_TABLET | Freq: Every day | ORAL | Status: DC

## 2021-03-27 MED ORDER — SODIUM CHLORIDE 0.9 % IV BOLUS
1000.0000 mL | Freq: Once | INTRAVENOUS | Status: AC
  Administered 2021-03-27: 1000 mL via INTRAVENOUS

## 2021-03-27 MED ORDER — DEXTROSE 5 % IV BOLUS
500.0000 mL | Freq: Once | INTRAVENOUS | Status: AC
  Administered 2021-03-28: 500 mL via INTRAVENOUS

## 2021-03-27 MED ORDER — PANTOPRAZOLE SODIUM 20 MG PO TBEC
20.0000 mg | DELAYED_RELEASE_TABLET | Freq: Two times a day (BID) | ORAL | Status: DC
Start: 2021-03-28 — End: 2021-03-28
  Filled 2021-03-27: qty 1

## 2021-03-27 MED ORDER — ENOXAPARIN SODIUM 40 MG/0.4ML IJ SOSY
40.0000 mg | PREFILLED_SYRINGE | Freq: Every day | INTRAMUSCULAR | Status: DC
  Administered 2021-03-27 – 2021-03-28 (×2): 40 mg via SUBCUTANEOUS
  Filled 2021-03-27 (×2): qty 0.4

## 2021-03-27 MED ORDER — DARIFENACIN HYDROBROMIDE ER 7.5 MG PO TB24
7.5000 mg | ORAL_TABLET | Freq: Every day | ORAL | Status: DC
  Filled 2021-03-27: qty 1

## 2021-03-27 MED ORDER — METRONIDAZOLE 500 MG/100ML IV SOLN
500.0000 mg | Freq: Once | INTRAVENOUS | Status: AC
  Administered 2021-03-27: 500 mg via INTRAVENOUS
  Filled 2021-03-27: qty 100

## 2021-03-27 MED ORDER — LACTATED RINGERS IV BOLUS (SEPSIS)
250.0000 mL | Freq: Once | INTRAVENOUS | Status: AC
  Administered 2021-03-27: 250 mL via INTRAVENOUS

## 2021-03-27 MED ORDER — LACTATED RINGERS IV BOLUS (SEPSIS)
1000.0000 mL | Freq: Once | INTRAVENOUS | Status: AC
  Administered 2021-03-27: 1000 mL via INTRAVENOUS

## 2021-03-27 NOTE — ED Notes (Signed)
In and out cath attempted with resistance. No urine obtained. Notified MD Regenia Skeeter. Pt arrived in wet diaper.

## 2021-03-27 NOTE — Progress Notes (Signed)
[facility] is following this Code Sepsis. 

## 2021-03-27 NOTE — Progress Notes (Signed)
A consult was received from an ED physician for vancomycin per pharmacy dosing.  The patient's profile has been reviewed for ht/wt/allergies/indication/available labs.    A one time order has been placed for vancomycin 1000 mg IV x1 .  Further antibiotics/pharmacy consults should be ordered by admitting physician if indicated.                       Thank you, Lynelle Doctor 04/20/2021  5:51 PM

## 2021-03-27 NOTE — ED Provider Notes (Signed)
[facility] DEPT Provider Note   CSN: [id number] Arrival date & time: 04/19/2021  1346  LEVEL 5 CAVEAT - ALTERED MENTAL STATUS  History Chief Complaint  Patient presents with  . Altered Mental Status    Robert Gross is a 87 y.o. male.  HPI 87 year old male presents with altered mental status.  History is taken from the nurse who spoke to EMS.  The patient has been having confusion for about a week and has been treated for a UTI (unknown antibiotic).  Otherwise, it seems that he was suddenly even worse today and they had a hard time getting him to swallow pills.  Patient occasionally says yes or no but otherwise does not talk to me and so the history is very limited at this point.  10:37 PM Further history from the wife at the bedside indicates he recently had an ear infection that was treated with antibiotics over past 2 weeks (maybe also a UTI?).  However over the last 2 to 3 days he has been more lethargic and not eating and drinking as much.  Not taking as many of his meds.  Chronically sounds hoarse with a cough but this is unchanged.  No fevers.  This is how he has acted with a UTI in the past. Recently had his VP shunt adjusted.  Past Medical History:  Diagnosis Date  . Arthritis   . BPH (benign prostatic hyperplasia)   . BPH with urinary obstruction   . Cataracts, bilateral   . Dizzy spells    residual from concussion 09-05-2017 intermittantly when turns head but stated as of 09-26-2017 no issues for past 2-3 days  . Essential hypertension   . Gait abnormality   . GERD (gastroesophageal reflux disease)   . History of concussion    09-05-2017  w/ loc --- per pt residual intermittant dizziness when he turns his head either way  . History of prostatitis   . History of sepsis    09-12-2017  urosepsis  . History of urinary retention   . Hydrocephalus ([place])   . Hypercalcemia   . Hyperlipemia   . Memory loss   . Metabolic encephalopathy   .  Osteopenia   . Squamous cell carcinoma in situ    multiple spots  . Type 2 diabetes mellitus ([place])   . Urinary retention    requires intermittent caths  . Vitamin D deficiency   . Vocal cord paralysis   . Wears partial dentures    upper    Patient Active Problem List   Diagnosis Date Noted  . Hyperlipemia   . Generalized weakness   . Hypercalcemia   . Communicating hydrocephalus ([place]) 04/08/2020  . Gait abnormality 03/06/2020  . Memory loss 02/12/2020  . Candiduria   . Memory difficulties   . Fall   . Acute metabolic encephalopathy 0000000  . UTI (urinary tract infection)   . Urinary retention due to benign prostatic hyperplasia 09/28/2017  . Acute urinary retention 09/12/2017  . Sepsis ([place]) 09/12/2017  . Abdominal pain 09/12/2017  . Essential hypertension   . Diabetes mellitus without complication ([place])   . GERD (gastroesophageal reflux disease)     Past Surgical History:  Procedure Laterality Date  . CATARACT EXTRACTION W/ INTRAOCULAR LENS  IMPLANT, BILATERAL  03/2017  . EXICISION EPIDERMAL CYST LEFT THUMB  09-12-2001   dr sypher  [facility]  . INGUINAL HERNIA REPAIR Right 11-07-2007   dr Dalbert Batman [facility]  . KNEE ARTHROSCOPY Right 1990s  .  MIDDLE EAR SURGERY     cyst removal  . SQUAMOUS CELL CARCINOMA EXCISION    . TRANSURETHRAL RESECTION OF PROSTATE  07-26-2000  dr Amalia Hailey [facility]  . TRANSURETHRAL RESECTION OF PROSTATE N/A 09/28/2017   Procedure: BIPOLAR TRANSURETHRAL RESECTION OF THE PROSTATE (TURP);  Surgeon: Lucas Mallow, MD;  Location: [facility];  Service: Urology;  Laterality: N/A;  . VENTRICULOPERITONEAL SHUNT Right 04/08/2020   Procedure: Shunt Placment - right occipital;  Surgeon: Earnie Larsson, MD;  Location: [facility];  Service: Neurosurgery;  Laterality: Right;       Family History  Problem Relation Age of Onset  . Pancreatic cancer Mother   . Melanoma Sister   . Lung cancer Brother   . Cancer Father        unsure of type    Social History    Tobacco Use  . Smoking status: Former Smoker    Years: 1.00    Types: Cigarettes    Quit date: 09/27/1967    Years since quitting: 53.5  . Smokeless tobacco: Never Used  Vaping Use  . Vaping Use: Never used  Substance Use Topics  . Alcohol use: Not Currently  . Drug use: No    Home Medications Prior to Admission medications   Medication Sig Start Date End Date Taking? Authorizing Provider  fluconazole (DIFLUCAN) 100 MG tablet Take 100 mg by mouth daily.   Yes [provider]  hydrALAZINE (APRESOLINE) 10 MG tablet Take 10 mg by mouth 3 (three) times daily. 01/28/21  Yes [provider]  lisinopril (PRINIVIL,ZESTRIL) 40 MG tablet Take 40 mg by mouth daily.    Yes [provider]  metFORMIN (GLUCOPHAGE) 500 MG tablet Take 500-1,000 mg by mouth See admin instructions. Take 1000 mg in the morning and 500 mg in the evening   Yes [provider]  pantoprazole (PROTONIX) 20 MG tablet Take 20 mg 2 (two) times daily by mouth.    Yes [provider]  trospium (SANCTURA) 20 MG tablet Take 20 mg by mouth at bedtime.  02/07/20  Yes [provider]  Vitamin D, Ergocalciferol, (DRISDOL) 1.25 MG (50000 UNIT) CAPS capsule Take 50,000 Units by mouth every 7 (seven) days. Wednesday   Yes [provider]  amLODipine (NORVASC) 5 MG tablet Take 1 tablet (5 mg total) by mouth daily. Patient not taking: No sig reported 06/18/20   Oswald Hillock, MD    Allergies    Patient has no known allergies.  Review of Systems   Review of Systems  Unable to perform ROS: Mental status change    Physical Exam Updated Vital Signs BP (!) 162/80   Pulse 82   Temp 98 F (36.7 C) (Oral)   Resp 18   Wt 69 kg   SpO2 98%   BMI 23.13 kg/m   Physical Exam Vitals and nursing note reviewed.  Constitutional:      Appearance: He is well-developed.  HENT:     Head: Normocephalic and atraumatic.     Right Ear: External ear normal.     Left Ear: External ear  normal.     Nose: Nose normal.  Eyes:     General:        Right eye: No discharge.        Left eye: No discharge.     Extraocular Movements: Extraocular movements intact.     Pupils: Pupils are equal, round, and reactive to light.  Cardiovascular:     Rate  and Rhythm: Regular rhythm. Tachycardia present.     Heart sounds: Normal heart sounds.  Pulmonary:     Effort: Pulmonary effort is normal.     Breath sounds: Rhonchi present.  Abdominal:     General: There is no distension.     Palpations: Abdomen is soft.     Tenderness: There is no abdominal tenderness.  Musculoskeletal:     Cervical back: Neck supple.  Skin:    General: Skin is warm and dry.  Neurological:     Mental Status: He is alert.     Comments: Awake, alert, but does not speak to me besides occasional yes/no. Equal strength in both upper extremities. Does not lift legs when asked to and they fall back to stretcher when I lift them up  Psychiatric:        Mood and Affect: Mood is not anxious.     ED Results / Procedures / Treatments   Labs (all labs ordered are listed, but only abnormal results are displayed) Labs Reviewed  COMPREHENSIVE METABOLIC PANEL - Abnormal; Notable for the following components:      Result Value   Sodium 150 (*)    Chloride 122 (*)    CO2 21 (*)    Glucose, Bld 171 (*)    BUN 45 (*)    Calcium 11.5 (*)    Total Protein 6.0 (*)    Albumin 2.6 (*)    Alkaline Phosphatase 134 (*)    All other components within normal limits  LACTIC ACID, PLASMA - Abnormal; Notable for the following components:   Lactic Acid, Venous 4.1 (*)    All other components within normal limits  LACTIC ACID, PLASMA - Abnormal; Notable for the following components:   Lactic Acid, Venous 5.0 (*)    All other components within normal limits  CBC WITH DIFFERENTIAL/PLATELET - Abnormal; Notable for the following components:   WBC 14.7 (*)    Neutro Abs 12.2 (*)    Abs Immature Granulocytes 0.23 (*)    All other  components within normal limits  URINALYSIS, ROUTINE W REFLEX MICROSCOPIC - Abnormal; Notable for the following components:   APPearance HAZY (*)    Glucose, UA 150 (*)    Hgb urine dipstick LARGE (*)    Ketones, ur 5 (*)    Protein, ur 30 (*)    Leukocytes,Ua LARGE (*)    RBC / HPF >50 (*)    WBC, UA >50 (*)    All other components within normal limits  LACTIC ACID, PLASMA - Abnormal; Notable for the following components:   Lactic Acid, Venous 3.1 (*)    All other components within normal limits  CULTURE, BLOOD (ROUTINE X 2)  CULTURE, BLOOD (ROUTINE X 2)  RESP PANEL BY RT-PCR (FLU A&B, COVID) ARPGX2  URINE CULTURE  PROTIME-INR  LACTIC ACID, PLASMA  BASIC METABOLIC PANEL  CBC  TSH  BASIC METABOLIC PANEL    EKG None  Radiology DG Chest 2 View  Result Date: Apr 18, 2021 CLINICAL DATA:  Cough. EXAM: CHEST - 2 VIEW COMPARISON:  June 12, 2020 FINDINGS: Mildly decreased lung volumes are seen which is likely, in part, secondary to the degree of patient inspiration. There is no evidence of acute infiltrate, pleural effusion or pneumothorax. Mild elevation of the right hemidiaphragm is noted. Ventriculoperitoneal catheter tubing is seen overlying the right lung. The heart size and mediastinal contours are within normal limits. There is moderate severity calcification of the thoracic aorta. Multiple soft tissue calcifications  are seen projecting over the right upper quadrant. The visualized skeletal structures are unremarkable. IMPRESSION: 1. No active cardiopulmonary disease. 2. Cholelithiasis. Electronically Signed   By: Virgina Norfolk M.D.   On: 04/07/2021 15:12   CT Head Wo Contrast  Result Date: 03/29/2021 CLINICAL DATA:  Mental status changes of unknown etiology, history hypertension, hydrocephalus, type II diabetes mellitus, former smoker EXAM: CT HEAD WITHOUT CONTRAST TECHNIQUE: Contiguous axial images were obtained from the base of the skull through the vertex without intravenous  contrast. Sagittal and coronal MPR images reconstructed from axial data set. COMPARISON:  03/16/2021 FINDINGS: Brain: Intracranial shunt via of posterior RIGHT parietal approach with tip at the RIGHT foramen of Monro. Generalized atrophy. Prominent lateral ventricles, slightly greater on LEFT, minimally increased. No midline shift or mass effect. Small vessel chronic ischemic changes of deep cerebral white matter. Cough no intracranial hemorrhage, mass lesion, or evidence of acute infarction. No extra-axial fluid collections. Vascular: Atherosclerotic calcification of internal carotid and vertebral arteries at skull base. Skull: Intact Sinuses/Orbits: Small chronic LEFT mastoid effusion. Prior partial LEFT mastoidectomy. Small amount of mucus within sphenoid sinus. Paranasal sinuses otherwise clear. Other: N/A IMPRESSION: Mildly dilated lateral ventricles slightly greater on LEFT, minimally increased from previous exam Atrophy with small vessel chronic ischemic changes of deep cerebral white matter. No additional intracranial abnormalities. Electronically Signed   By: Lavonia Dana M.D.   On: 04/04/2021 17:15    Procedures .Critical Care Performed by: Sherwood Gambler, MD Authorized by: Sherwood Gambler, MD   Critical care provider statement:    Critical care time (minutes):  45   Critical care time was exclusive of:  Separately billable procedures and treating other patients   Critical care was necessary to treat or prevent imminent or life-threatening deterioration of the following conditions:  Renal failure and sepsis   Critical care was time spent personally by me on the following activities:  Discussions with consultants, evaluation of patient's response to treatment, examination of patient, ordering and performing treatments and interventions, ordering and review of laboratory studies, ordering and review of radiographic studies, pulse oximetry, re-evaluation of patient's condition, obtaining history  from patient or surrogate and review of old charts     Medications Ordered in ED Medications  enoxaparin (LOVENOX) injection 40 mg (40 mg Subcutaneous Given 04/02/2021 2215)  0.9 %  sodium chloride infusion ( Intravenous New Bag/Given 04/12/2021 1949)  sodium chloride 0.9 % bolus 1,000 mL (0 mLs Intravenous Stopped 04/02/2021 1514)  lactated ringers bolus 1,000 mL (0 mLs Intravenous Stopped 04/20/2021 1552)    And  lactated ringers bolus 250 mL (0 mLs Intravenous Stopped 03/28/2021 1529)  vancomycin (VANCOCIN) IVPB 1000 mg/200 mL premix (0 mg Intravenous Stopped 03/30/2021 1951)  metroNIDAZOLE (FLAGYL) IVPB 500 mg (0 mg Intravenous Stopped 04/15/2021 2049)  ceFEPIme (MAXIPIME) 2 g in sodium chloride 0.9 % 100 mL IVPB (0 g Intravenous Stopped 04/14/2021 2049)    ED Course  I have reviewed the triage vital signs and the nursing notes.  Pertinent labs & imaging results that were available during my care of the patient were reviewed by me and considered in my medical decision making (see chart for details).  Clinical Course as of 03/23/2021 2237  Fri Mar 27, 2021  1512 Lactic acid is 4.1.  Given this in combination with his low-grade temperature of 99.6, tachycardia, elevated WBC I will treat as sepsis with 30 cc/KG fluids and give antibiotics. Will start with rocephin given concern for UTI [SG]  1749 Patient has  received his 30 cc/KG.  Urinalysis is unfortunately still pending.  They did just place a Foley catheter to get urine.  However his lactate is higher at 5.  He is not hypotensive.  Will add vancomycin for broader coverage. [SG]  Butler recommends adding cefepime/flagyl for now (in addition to vancomycin), can scale back later [SG]    Clinical Course User Index [SG] Sherwood Gambler, MD   MDM Rules/Calculators/A&P                          Patient is not in acute distress but his numbers are concerning including his lactic acidosis that is getting worse despite IV fluids.  He also has a bump in his  creatinine and hypercalcemia.  No significant change in mental status since arrival to the ED where he is awake and alert but not talking much.  I did have a long discussion with the wife and she does want treatment but would not want to do anything critical such as central line access/pressors, intubation, or CPR.  However she would like him to be treated.  Urine is concerning for UTI.  He was given broad antibiotics as above as well as fluids.  I did briefly touch base with critical care given his rising lactate but they recommend treating with further fluids and antibiotics and admitting to medicine.  Dr. Flossie Buffy to admit Final Clinical Impression(s) / ED Diagnoses Final diagnoses:  Septic shock [facility])    Rx / DC Orders ED Discharge Orders    None       Sherwood Gambler, MD 03/24/2021 2238

## 2021-03-27 NOTE — ED Notes (Signed)
pts wife very concerned that he has not eaten. Explained to wife that because of pts level of consciousness it is not safe for him to eat right now, notified MD goldston of family concern. Awaiting speech swallow exam.

## 2021-03-27 NOTE — ED Notes (Signed)
ED TO INPATIENT HANDOFF REPORT  Name/Age/Gender Robert Gross 87 y.o. male  Code Status    Code Status Orders  (From admission, onward)         Start     Ordered   04/07/2021 1911  Do not attempt resuscitation (DNR)  Continuous       Question Answer Comment  In the event of cardiac or respiratory ARREST Do not call a "code blue"   In the event of cardiac or respiratory ARREST Do not perform Intubation, CPR, defibrillation or ACLS   In the event of cardiac or respiratory ARREST Use medication by any route, position, wound care, and other measures to relive pain and suffering. May use oxygen, suction and manual treatment of airway obstruction as needed for comfort.      2021/04/07 1911        Code Status History    Date Active Date Inactive Code Status Order ID Comments User Context   06/12/2020 1750 06/17/2020 2145 Full Code [id number]  Mckinley Jewel, MD ED   04/08/2020 1107 04/09/2020 1755 Full Code [id number]  Earnie Larsson, MD Inpatient   01/11/2020 1834 01/13/2020 1949 Full Code [id number]  Sid Falcon, MD Inpatient   09/28/2017 1605 09/29/2017 1612 Full Code [id number]  Lucas Mallow, MD Inpatient   09/13/2017 0011 09/13/2017 1754 Full Code [id number]  Ivor Costa, MD ED   Advance Care Planning Activity      Home/SNF/Other Home  Chief Complaint Acute metabolic encephalopathy [I62.70]  Level of Care/Admitting Diagnosis ED Disposition    ED Disposition Condition [place]: [facility] [100102]  Level of Care: Progressive [102]  Admit to Progressive based on following criteria: MULTISYSTEM THREATS such as stable sepsis, metabolic/electrolyte imbalance with or without encephalopathy that is responding to early treatment.  May admit patient to [facility] or [facility] if equivalent level of care is available:: No  Covid Evaluation: Asymptomatic Screening Protocol (No Symptoms)  Diagnosis: Acute metabolic encephalopathy [3500938]   Admitting Physician: Orene Desanctis [[id number]]  Attending Physician: Orene Desanctis [[id number]]  Estimated length of stay: past midnight tomorrow  Certification:: I certify this patient will need inpatient services for at least 2 midnights       Medical History Past Medical History:  Diagnosis Date  . Arthritis   . BPH (benign prostatic hyperplasia)   . BPH with urinary obstruction   . Cataracts, bilateral   . Dizzy spells    residual from concussion 09-05-2017 intermittantly when turns head but stated as of 09-26-2017 no issues for past 2-3 days  . Essential hypertension   . Gait abnormality   . GERD (gastroesophageal reflux disease)   . History of concussion    09-05-2017  w/ loc --- per pt residual intermittant dizziness when he turns his head either way  . History of prostatitis   . History of sepsis    09-12-2017  urosepsis  . History of urinary retention   . Hydrocephalus ([place])   . Hypercalcemia   . Hyperlipemia   . Memory loss   . Metabolic encephalopathy   . Osteopenia   . Squamous cell carcinoma in situ    multiple spots  . Type 2 diabetes mellitus ([place])   . Urinary retention    requires intermittent caths  . Vitamin D deficiency   . Vocal cord paralysis   . Wears partial dentures    upper    Allergies No Known Allergies  IV Location/Drains/Wounds Patient Lines/Drains/Airways Status    Active Line/Drains/Airways    Name Placement date Placement time Site Days   Peripheral IV Apr 03, 2021 Left Antecubital Apr 03, 2021  1309  Antecubital  less than 1   Urethral Catheter Courtney RN  Coude 18 Fr. Apr 03, 2021  1739  Coude  less than 1   External Urinary Catheter 06/16/20  2017  --  284   Incision (Closed) 09/28/17 Penis 09/28/17  0855  -- 1276   Incision (Closed) 04/08/20 Abdomen Right 04/08/20  0910  -- 353   Incision (Closed) 04/08/20 Head Right 04/08/20  0910  -- 353          Labs/Imaging Results for orders placed or performed during the hospital encounter of  04-03-21 (from the past 48 hour(s))  Lactic acid, plasma     Status: Abnormal   Collection Time: 03-Apr-2021  2:03 PM  Result Value Ref Range   Lactic Acid, Venous 4.1 (HH) 0.5 - 1.9 mmol/L    Comment: CRITICAL RESULT CALLED TO, READ BACK BY AND VERIFIED WITH: GARRISON,G. RN @1511  ON 2021-04-03 BY COHEN,K Performed at [facility], [place] [address] [place], [place] [address]   Urinalysis, Routine w reflex microscopic     Status: Abnormal   Collection Time: Apr 03, 2021  2:03 PM  Result Value Ref Range   Color, Urine YELLOW YELLOW   APPearance HAZY (A) CLEAR   Specific Gravity, Urine 1.020 1.005 - 1.030   pH 5.0 5.0 - 8.0   Glucose, UA 150 (A) NEGATIVE mg/dL   Hgb urine dipstick LARGE (A) NEGATIVE   Bilirubin Urine NEGATIVE NEGATIVE   Ketones, ur 5 (A) NEGATIVE mg/dL   Protein, ur 30 (A) NEGATIVE mg/dL   Nitrite NEGATIVE NEGATIVE   Leukocytes,Ua LARGE (A) NEGATIVE   RBC / HPF >50 (H) 0 - 5 RBC/hpf   WBC, UA >50 (H) 0 - 5 WBC/hpf   Bacteria, UA NONE SEEN NONE SEEN   Squamous Epithelial / LPF 0-5 0 - 5   Mucus PRESENT     Comment: Performed at [facility], [place] [address], [place], [place] [address]  Comprehensive metabolic panel     Status: Abnormal   Collection Time: 04-03-21  2:06 PM  Result Value Ref Range   Sodium 150 (H) 135 - 145 mmol/L   Potassium 4.4 3.5 - 5.1 mmol/L   Chloride 122 (H) 98 - 111 mmol/L   CO2 21 (L) 22 - 32 mmol/L   Glucose, Bld 171 (H) 70 - 99 mg/dL    Comment: Glucose reference range applies only to samples taken after fasting for at least 8 hours.   BUN 45 (H) 8 - 23 mg/dL   Creatinine, Ser 1.08 0.61 - 1.24 mg/dL   Calcium 11.5 (H) 8.9 - 10.3 mg/dL   Total Protein 6.0 (L) 6.5 - 8.1 g/dL   Albumin 2.6 (L) 3.5 - 5.0 g/dL   AST 16 15 - 41 U/L   ALT 14 0 - 44 U/L   Alkaline Phosphatase 134 (H) 38 - 126 U/L   Total Bilirubin 0.5 0.3 - 1.2 mg/dL   GFR, Estimated >60 >60 mL/min    Comment: (NOTE) Calculated using the  CKD-EPI Creatinine Equation (2021)    Anion gap 7 5 - 15    Comment: Performed at [facility], [place] [address], [place], [place] [address]  CBC with Differential     Status: Abnormal   Collection Time: 04/03/2021  2:06 PM  Result Value  Ref Range   WBC 14.7 (H) 4.0 - 10.5 K/uL   RBC 4.86 4.22 - 5.81 MIL/uL   Hemoglobin 15.8 13.0 - 17.0 g/dL   HCT 48.1 39.0 - 52.0 %   MCV 99.0 80.0 - 100.0 fL   MCH 32.5 26.0 - 34.0 pg   MCHC 32.8 30.0 - 36.0 g/dL   RDW 13.4 11.5 - 15.5 %   Platelets 184 150 - 400 K/uL   nRBC 0.0 0.0 - 0.2 %   Neutrophils Relative % 82 %   Neutro Abs 12.2 (H) 1.7 - 7.7 K/uL   Lymphocytes Relative 8 %   Lymphs Abs 1.2 0.7 - 4.0 K/uL   Monocytes Relative 6 %   Monocytes Absolute 0.8 0.1 - 1.0 K/uL   Eosinophils Relative 1 %   Eosinophils Absolute 0.2 0.0 - 0.5 K/uL   Basophils Relative 1 %   Basophils Absolute 0.1 0.0 - 0.1 K/uL   Immature Granulocytes 2 %   Abs Immature Granulocytes 0.23 (H) 0.00 - 0.07 K/uL    Comment: Performed at [facility], [place] [address], [place], [place] [address]  Protime-INR     Status: None   Collection Time: 2021-04-18  2:06 PM  Result Value Ref Range   Prothrombin Time 14.7 11.4 - 15.2 seconds   INR 1.2 0.8 - 1.2    Comment: (NOTE) INR goal varies based on device and disease states. Performed at [facility], [place] [address], [place], [place] [address]   Culture, blood (Routine x 2)     Status: None (Preliminary result)   Collection Time: 2021/04/18  2:06 PM   Specimen: BLOOD  Result Value Ref Range   Specimen Description      BLOOD LEFT ANTECUBITAL Performed at [facility], [place] [address] [place], [place] [address]    Special Requests      BOTTLES DRAWN AEROBIC AND ANAEROBIC Blood Culture adequate volume Performed at [facility], [place] [address] [place], [place] [address]    Culture PENDING    Report Status PENDING   Culture, blood (Routine x  2)     Status: None (Preliminary result)   Collection Time: 18-Apr-2021  2:12 PM   Specimen: Site Not Specified; Blood  Result Value Ref Range   Specimen Description      SITE NOT SPECIFIED Performed at [place] [address], [place], [place] [address]    Special Requests      BOTTLES DRAWN AEROBIC AND ANAEROBIC Blood Culture adequate volume Performed at [facility], [place] [address] [place], [place] [address]    Culture PENDING    Report Status PENDING   Resp Panel by RT-PCR (Flu A&B, Covid) Nasopharyngeal Swab     Status: None   Collection Time: 04/18/21  3:15 PM   Specimen: Nasopharyngeal Swab; Nasopharyngeal(NP) swabs in vial transport medium  Result Value Ref Range   SARS Coronavirus 2 by RT PCR NEGATIVE NEGATIVE    Comment: (NOTE) SARS-CoV-2 target nucleic acids are NOT DETECTED.  The SARS-CoV-2 RNA is generally detectable in upper respiratory specimens during the acute phase of infection. The lowest concentration of SARS-CoV-2 viral copies this assay can detect is 138 copies/mL. A negative result does not preclude SARS-Cov-2 infection and should not be used as the sole basis for treatment or other patient management decisions. A negative result may occur with  improper specimen collection/handling, submission of specimen other than nasopharyngeal swab, presence of viral  mutation(s) within the areas targeted by this assay, and inadequate number of viral copies(<138 copies/mL). A negative result must be combined with clinical observations, patient history, and epidemiological information. The expected result is Negative.  Fact Sheet for Patients:  EntrepreneurPulse.com.au  Fact Sheet for Healthcare Providers:  IncredibleEmployment.be  This test is no t yet approved or cleared by the [place] FDA and  has been authorized for detection and/or diagnosis of SARS-CoV-2 by FDA under an Emergency Use  Authorization (EUA). This EUA will remain  in effect (meaning this test can be used) for the duration of the COVID-19 declaration under Section 564(b)(1) of the Act, 21 U.S.C.section 360bbb-3(b)(1), unless the authorization is terminated  or revoked sooner.       Influenza A by PCR NEGATIVE NEGATIVE   Influenza B by PCR NEGATIVE NEGATIVE    Comment: (NOTE) The Xpert Xpress SARS-CoV-2/FLU/RSV plus assay is intended as an aid in the diagnosis of influenza from Nasopharyngeal swab specimens and should not be used as a sole basis for treatment. Nasal washings and aspirates are unacceptable for Xpert Xpress SARS-CoV-2/FLU/RSV testing.  Fact Sheet for Patients: EntrepreneurPulse.com.au  Fact Sheet for Healthcare Providers: IncredibleEmployment.be  This test is not yet approved or cleared by the [place] FDA and has been authorized for detection and/or diagnosis of SARS-CoV-2 by FDA under an Emergency Use Authorization (EUA). This EUA will remain in effect (meaning this test can be used) for the duration of the COVID-19 declaration under Section 564(b)(1) of the Act, 21 U.S.C. section 360bbb-3(b)(1), unless the authorization is terminated or revoked.  Performed at [facility], [place] [address], [place], [place] [address]   Lactic acid, plasma     Status: Abnormal   Collection Time: 03/31/2021  4:03 PM  Result Value Ref Range   Lactic Acid, Venous 5.0 (HH) 0.5 - 1.9 mmol/L    Comment: CRITICAL VALUE NOTED.  VALUE IS CONSISTENT WITH PREVIOUSLY REPORTED AND CALLED VALUE. Performed at [facility], [place] [address], [place], [place] [address]    DG Chest 2 View  Result Date: 03/24/2021 CLINICAL DATA:  Cough. EXAM: CHEST - 2 VIEW COMPARISON:  June 12, 2020 FINDINGS: Mildly decreased lung volumes are seen which is likely, in part, secondary to the degree of patient inspiration. There is no evidence of acute  infiltrate, pleural effusion or pneumothorax. Mild elevation of the right hemidiaphragm is noted. Ventriculoperitoneal catheter tubing is seen overlying the right lung. The heart size and mediastinal contours are within normal limits. There is moderate severity calcification of the thoracic aorta. Multiple soft tissue calcifications are seen projecting over the right upper quadrant. The visualized skeletal structures are unremarkable. IMPRESSION: 1. No active cardiopulmonary disease. 2. Cholelithiasis. Electronically Signed   By: Virgina Norfolk M.D.   On: 04/20/2021 15:12   CT Head Wo Contrast  Result Date: 04/18/2021 CLINICAL DATA:  Mental status changes of unknown etiology, history hypertension, hydrocephalus, type II diabetes mellitus, former smoker EXAM: CT HEAD WITHOUT CONTRAST TECHNIQUE: Contiguous axial images were obtained from the base of the skull through the vertex without intravenous contrast. Sagittal and coronal MPR images reconstructed from axial data set. COMPARISON:  03/16/2021 FINDINGS: Brain: Intracranial shunt via of posterior RIGHT parietal approach with tip at the RIGHT foramen of [place]. Generalized atrophy. Prominent lateral ventricles, slightly greater on LEFT, minimally increased. No midline shift or mass effect. Small vessel chronic ischemic changes of deep cerebral white matter. Cough no intracranial hemorrhage, mass lesion, or evidence of acute  infarction. No extra-axial fluid collections. Vascular: Atherosclerotic calcification of internal carotid and vertebral arteries at skull base. Skull: Intact Sinuses/Orbits: Small chronic LEFT mastoid effusion. Prior partial LEFT mastoidectomy. Small amount of mucus within sphenoid sinus. Paranasal sinuses otherwise clear. Other: N/A IMPRESSION: Mildly dilated lateral ventricles slightly greater on LEFT, minimally increased from previous exam Atrophy with small vessel chronic ischemic changes of deep cerebral white matter. No additional  intracranial abnormalities. Electronically Signed   By: Lavonia Dana M.D.   On: 04/12/2021 17:15    Pending Labs Unresulted Labs (From admission, onward)          Start     Ordered   03/28/21 [id number]  Basic metabolic panel  Tomorrow morning,   R        04/14/2021 1911   03/28/21 0500  CBC  Tomorrow morning,   R        03/26/2021 1911   03/28/21 0500  TSH  Tomorrow morning,   R        04/19/2021 1911   04/19/2021 [id number]  Basic metabolic panel  ONCE - STAT,   STAT        04/15/2021 1911   04/12/2021 1857  Lactic acid, plasma  Now then every 2 hours,   STAT      03/23/2021 1856   04/21/2021 1414  Urine culture  ONCE - STAT,   STAT        03/24/2021 1414          Vitals/Pain Today's Vitals   03/23/2021 1745 04/19/2021 1830 04/19/2021 1900 03/25/2021 1930  BP: (!) 168/73 (!) 174/98 (!) 165/96 (!) 152/91  Pulse: (!) 45 (!) 55 (!) 57 (!) 41  Resp: (!) 21 19 17 20   Temp:      TempSrc:      SpO2: 95% 94% 95% 96%  Weight:        Isolation Precautions Airborne and Contact precautions  Medications Medications  metroNIDAZOLE (FLAGYL) IVPB 500 mg (500 mg Intravenous New Bag/Given 04/12/2021 1950)  enoxaparin (LOVENOX) injection 40 mg (has no administration in time range)  0.9 %  sodium chloride infusion ( Intravenous New Bag/Given 03/26/2021 1949)  sodium chloride 0.9 % bolus 1,000 mL (0 mLs Intravenous Stopped 04/10/2021 1514)  lactated ringers bolus 1,000 mL (0 mLs Intravenous Stopped 04/11/2021 1552)    And  lactated ringers bolus 250 mL (0 mLs Intravenous Stopped 04/16/2021 1529)  vancomycin (VANCOCIN) IVPB 1000 mg/200 mL premix (0 mg Intravenous Stopped 04/12/2021 1951)  ceFEPIme (MAXIPIME) 2 g in sodium chloride 0.9 % 100 mL IVPB (2 g Intravenous New Bag/Given 04/18/2021 1953)    Mobility walks with person assist

## 2021-03-27 NOTE — H&P (Addendum)
History and Physical    Robert Gross [id number] DOB: Apr 02, 1929 DOA: 03/24/2021  PCP: [name], MD  Patient coming from: Home, wife at bedside  I have personally briefly reviewed patient's old medical records in [place]  Chief Complaint: Altered mental status  HPI: Robert Gross is a 87 y.o. male with medical history significant for communicating hydrocephalus s/p VP shunt, hypertension and type 2 diabetes who presents with family for concerns of altered mental status.  Patient unable to provide HPI due to altered mental status.  Wife is at bedside but also unable to provide a detailed history.  I spoke with daughter [name] who is a Software engineer over the phone.   She reports that about 6 weeks ago he was doing well and even went out to eat with the family.  Then sometime in March he was diagnosed with an ear infection and was started on antibiotics and 2 weeks of prednisone. During the steroid course he had hypentension and hyperglycemia up to 400s. Once the prednisone was stopped patient began to have a sharp decline in his health and had progressive weakness.   A CT head follow up for his shunt done on 4/5 showed a new subacute infarct that family was unaware of. Then last week he was also diagnosed with UTI with urine culture growing yeast and was placed on fluconazole which he is currently still taking.  Last Thursday he also saw neurosurgery and had his VP shunt readjusted. Starting Friday he could no longer get out of bed and had difficulty eating. Family had to give him a more pureed diet.   Today he is alert but otherwise disoriented. Did not recognize his wife at bedside. Had temp of 0000000, BP with systolic in [id number]. Has leukocytosis of 14.7.  Lactate of 4 and increased to 5 despite aggressive fluid resuscitation.  Sodium of 150.  Elevated creatinine of 1.08.  Calcium of 11.5 with albumin of 2.6.  CT head shows slight dilated left lateral ventricle but no other acute  changes.  UA shows large leukocyte, negative nitrite, no bacteria but greater than 50 WBC.  He was initially started on IV Rocephin by ED physician but later was broadened to with the addition of vancomycin and cefepime since he continued to have worsening lactic acidosis.  PCCM was consulted by ED physician Dr. Regenia Skeeter but they recommended that fluid resuscitation and admission by hospitalist.   Review of Systems: Unable to fully obtain due to AMS  Past Medical History:  Diagnosis Date  . Arthritis   . BPH (benign prostatic hyperplasia)   . BPH with urinary obstruction   . Cataracts, bilateral   . Dizzy spells    residual from concussion 09-05-2017 intermittantly when turns head but stated as of 09-26-2017 no issues for past 2-3 days  . Essential hypertension   . Gait abnormality   . GERD (gastroesophageal reflux disease)   . History of concussion    09-05-2017  w/ loc --- per pt residual intermittant dizziness when he turns his head either way  . History of prostatitis   . History of sepsis    09-12-2017  urosepsis  . History of urinary retention   . Hydrocephalus ([place])   . Hypercalcemia   . Hyperlipemia   . Memory loss   . Metabolic encephalopathy   . Osteopenia   . Squamous cell carcinoma in situ    multiple spots  . Type 2 diabetes mellitus ([place])   . Urinary retention  requires intermittent caths  . Vitamin D deficiency   . Vocal cord paralysis   . Wears partial dentures    upper    Past Surgical History:  Procedure Laterality Date  . CATARACT EXTRACTION W/ INTRAOCULAR LENS  IMPLANT, BILATERAL  03/2017  . EXICISION EPIDERMAL CYST LEFT THUMB  09-12-2001   dr sypher  [facility]  . INGUINAL HERNIA REPAIR Right 11-07-2007   dr Dalbert [facility]  . KNEE ARTHROSCOPY Right 1990s  . MIDDLE EAR SURGERY     cyst removal  . SQUAMOUS CELL CARCINOMA EXCISION    . TRANSURETHRAL RESECTION OF PROSTATE  07-26-2000  dr Amalia Ha[facility]  . TRANSURETHRAL RESECTION OF PROSTATE N/A 09/28/2017    Procedure: BIPOLAR TRANSURETHRAL RESECTION OF THE PROSTATE (TURP);  Surgeon: Lucas Mallow, MD;  Location: [facility];  Service: Urology;  Laterality: N/A;  . VENTRICULOPERITONEAL SHUNT Right 04/08/2020   Procedure: Shunt Placment - right occipital;  Surgeon: Earnie Larsson, MD;  Location: [place];  Service: Neurosurgery;  Laterality: Right;     reports that he quit smoking about 53 years ago. His smoking use included cigarettes. He quit after 1.00 year of use. He has never used smokeless tobacco. He reports previous alcohol use. He reports that he does not use drugs. Social History  No Known Allergies  Family History  Problem Relation Age of Onset  . Pancreatic cancer Mother   . Melanoma Sister   . Lung cancer Brother   . Cancer Father        unsure of type     Prior to Admission medications   Medication Sig Start Date End Date Taking? Authorizing Provider  fluconazole (DIFLUCAN) 100 MG tablet Take 100 mg by mouth daily.   Yes [provider]  hydrALAZINE (APRESOLINE) 10 MG tablet Take 10 mg by mouth 3 (three) times daily. 01/28/21  Yes [provider]  lisinopril (PRINIVIL,ZESTRIL) 40 MG tablet Take 40 mg by mouth daily.    Yes [provider]  metFORMIN (GLUCOPHAGE) 500 MG tablet Take 500-1,000 mg by mouth See admin instructions. Take 1000 mg in the morning and 500 mg in the evening   Yes [provider]  pantoprazole (PROTONIX) 20 MG tablet Take 20 mg 2 (two) times daily by mouth.    Yes [provider]  trospium (SANCTURA) 20 MG tablet Take 20 mg by mouth at bedtime.  02/07/20  Yes [provider]  Vitamin D, Ergocalciferol, (DRISDOL) 1.25 MG (50000 UNIT) CAPS capsule Take 50,000 Units by mouth every 7 (seven) days. Wednesday   Yes [provider]  amLODipine (NORVASC) 5 MG tablet Take 1 tablet (5 mg total) by mouth daily. Patient not taking: Reported on 04-18-2021 06/18/20   Oswald Hillock, MD    Physical  Exam: Vitals:   Apr 18, 2021 1715 18-Apr-2021 1745 18-Apr-2021 1830 Apr 18, 2021 1900  BP: (!) 169/104 (!) 168/73 (!) 174/98 (!) 165/96  Pulse: 95 (!) 45 (!) 55 (!) 57  Resp: (!) 25 (!) 21 19 17   Temp:      TempSrc:      SpO2: 95% 95% 94% 95%  Weight:        Constitutional: NAD, calm, comfortable, ill appearing thin cachectic male laying flat in bed Vitals:   04-18-2021 1715 Apr 18, 2021 1745 Apr 18, 2021 1830 2021-04-18 1900  BP: (!) 169/104 (!) 168/73 (!) 174/98 (!) 165/96  Pulse: 95 (!) 45 (!) 55 (!) 57  Resp: (!) 25 (!) 21 19 17   Temp:  TempSrc:      SpO2: 95% 95% 94% 95%  Weight:       Eyes: PERRL, lids and conjunctivae normal ENMT: Mucous membranes are moist.  Neck: normal, supple Respiratory: clear to auscultation bilaterally, no wheezing, no crackles. Normal respiratory effort. No accessory muscle use.  Cardiovascular: Regular rate and rhythm with frequent PVCs on telemetry, no murmurs / rubs / gallops. No extremity edema.  Abdomen: no tenderness, no masses palpated.  Bowel sounds positive.  GU: foley catheter in place with urine output Musculoskeletal: no clubbing / cyanosis. No joint deformity upper and lower extremities. Good ROM, no contractures. Normal muscle tone.  Skin: no rashes, lesions, ulcers. No induration Neurologic: CN 2-12 grossly intact. Sensation intact,  Strength 3/5 in all 4.  Psychiatric:  Alert and oriented to self and place but unable to recognize wife at bedside.    Labs on Admission: I have personally reviewed following labs and imaging studies  CBC: Recent Labs  Lab 03-28-21 1406  WBC 14.7*  NEUTROABS 12.2*  HGB 15.8  HCT 48.1  MCV 99.0  PLT 101   Basic Metabolic Panel: Recent Labs  Lab Mar 28, 2021 1406  NA 150*  K 4.4  CL 122*  CO2 21*  GLUCOSE 171*  BUN 45*  CREATININE 1.08  CALCIUM 11.5*   GFR: CrCl cannot be calculated (Unknown ideal weight.). Liver Function Tests: Recent Labs  Lab 03-28-21 1406  AST 16  ALT 14  ALKPHOS 134*  BILITOT  0.5  PROT 6.0*  ALBUMIN 2.6*   No results for input(s): LIPASE, AMYLASE in the last 168 hours. No results for input(s): AMMONIA in the last 168 hours. Coagulation Profile: Recent Labs  Lab 2021-03-28 1406  INR 1.2   Cardiac Enzymes: No results for input(s): CKTOTAL, CKMB, CKMBINDEX, TROPONINI in the last 168 hours. BNP (last 3 results) No results for input(s): PROBNP in the last 8760 hours. HbA1C: No results for input(s): HGBA1C in the last 72 hours. CBG: No results for input(s): GLUCAP in the last 168 hours. Lipid Profile: No results for input(s): CHOL, HDL, LDLCALC, TRIG, CHOLHDL, LDLDIRECT in the last 72 hours. Thyroid Function Tests: No results for input(s): TSH, T4TOTAL, FREET4, T3FREE, THYROIDAB in the last 72 hours. Anemia Panel: No results for input(s): VITAMINB12, FOLATE, FERRITIN, TIBC, IRON, RETICCTPCT in the last 72 hours. Urine analysis:    Component Value Date/Time   COLORURINE YELLOW 03-28-21 1403   APPEARANCEUR HAZY (A) Mar 28, 2021 1403   LABSPEC 1.020 03/28/2021 1403   PHURINE 5.0 03/28/2021 1403   GLUCOSEU 150 (A) Mar 28, 2021 1403   HGBUR LARGE (A) 2021-03-28 1403   BILIRUBINUR NEGATIVE 03/28/21 1403   KETONESUR 5 (A) 2021-03-28 1403   PROTEINUR 30 (A) 03-28-2021 1403   UROBILINOGEN 0.2 11/03/2007 2123   NITRITE NEGATIVE 2021/03/28 1403   LEUKOCYTESUR LARGE (A) 28-Mar-2021 1403    Radiological Exams on Admission: DG Chest 2 View  Result Date: 2021-03-28 CLINICAL DATA:  Cough. EXAM: CHEST - 2 VIEW COMPARISON:  June 12, 2020 FINDINGS: Mildly decreased lung volumes are seen which is likely, in part, secondary to the degree of patient inspiration. There is no evidence of acute infiltrate, pleural effusion or pneumothorax. Mild elevation of the right hemidiaphragm is noted. Ventriculoperitoneal catheter tubing is seen overlying the right lung. The heart size and mediastinal contours are within normal limits. There is moderate severity calcification of the  thoracic aorta. Multiple soft tissue calcifications are seen projecting over the right upper quadrant. The visualized skeletal structures are unremarkable. IMPRESSION:  1. No active cardiopulmonary disease. 2. Cholelithiasis. Electronically Signed   By: Virgina Norfolk M.D.   On: 03/24/2021 15:12   CT Head Wo Contrast  Result Date: 03/29/2021 CLINICAL DATA:  Mental status changes of unknown etiology, history hypertension, hydrocephalus, type II diabetes mellitus, former smoker EXAM: CT HEAD WITHOUT CONTRAST TECHNIQUE: Contiguous axial images were obtained from the base of the skull through the vertex without intravenous contrast. Sagittal and coronal MPR images reconstructed from axial data set. COMPARISON:  03/16/2021 FINDINGS: Brain: Intracranial shunt via of posterior RIGHT parietal approach with tip at the RIGHT foramen of Monro. Generalized atrophy. Prominent lateral ventricles, slightly greater on LEFT, minimally increased. No midline shift or mass effect. Small vessel chronic ischemic changes of deep cerebral white matter. Cough no intracranial hemorrhage, mass lesion, or evidence of acute infarction. No extra-axial fluid collections. Vascular: Atherosclerotic calcification of internal carotid and vertebral arteries at skull base. Skull: Intact Sinuses/Orbits: Small chronic LEFT mastoid effusion. Prior partial LEFT mastoidectomy. Small amount of mucus within sphenoid sinus. Paranasal sinuses otherwise clear. Other: N/A IMPRESSION: Mildly dilated lateral ventricles slightly greater on LEFT, minimally increased from previous exam Atrophy with small vessel chronic ischemic changes of deep cerebral white matter. No additional intracranial abnormalities. Electronically Signed   By: Lavonia Dana M.D.   On: 04/20/2021 17:15      Assessment/Plan  Acute metabolic encephalopathy -likely from dehydration with hypernatremia and hypercalcemia -Unclear if he has other sources of infection other than UTI.   However we will continue vancomycin, cefepime and Flagyl and can de-escalate pending blood culture and urine culture. -Correct electrolyte abnormalities as below -CT head negative for acute changes of his VP shunt  UTI -UA positive here.  Continue IV antibiotics pending urine culture -Continue previous outpatient fluconazole  AKI -Cr of 1.08 from 0.93 -Hold Lisinopril for now - monitor with repeat labs after fluids  Hypernatremia -Likely secondary to dehydration.  However sodium was slightly worse on repeat despite close to 2 L IV fluid resuscitation -Give 500 cc bolus of D5 -repeat bmp again in 4 hrs - check urine osm  Hypercalcemia - downward trended with fluids - continue to monitor with repeat lab   Questionable dysphagia -family having to do more pureed diet recently -NPO except for meds - speech evaluation  Hx of communicating hydrocephalus s/p VP shunt -Negative CT head for acute changes  HTN - continue hydralazine.  Hold lisinopril due to AKI  Type 2 DM - no hyperglycemia - monitor with morning lab   Hx of CVA  - pt noted to have subacute infarct in CT head back in April -Start aspirin  DVT prophylaxis:.Lovenox Code Status: DNR- per wife at bedside  Family Communication: Plan discussed with wife  at bedside and daughter Tammy over the phone disposition Plan: Home with at least 2 midnight stays  Consults called:  Admission status: inpatient  Level of care: Progressive  Status is: Inpatient  Remains inpatient appropriate because:Inpatient level of care appropriate due to severity of illness   Dispo: The patient is from: Home              Anticipated d/c is to: Home              Patient currently is not medically stable to d/c.   Difficult to place patient No         Orene Desanctis DO [facility]   If 7PM-7AM, please contact night-coverage [email or web address]   04/07/2021, 7:13 PM

## 2021-03-27 NOTE — ED Notes (Signed)
Lactic 4.1, Regenia Skeeter, MD notified.

## 2021-03-27 NOTE — ED Triage Notes (Signed)
ams x 1 week, being treated for uti at home, worsening ams today

## 2021-03-27 NOTE — Progress Notes (Addendum)
A consult was received from an ED physician for cefepime per pharmacy dosing.  The patient's profile has been reviewed for ht/wt/allergies/indication/available labs.    A one time order has been placed for Cefepime 2g IV x 1.  Further antibiotics/pharmacy consults should be ordered by admitting physician if indicated.                       Thank you, Shirlyn Savin A 04/01/2021  6:09 PM

## 2021-03-28 DIAGNOSIS — I10 Essential (primary) hypertension: Secondary | ICD-10-CM | POA: Diagnosis not present

## 2021-03-28 LAB — BASIC METABOLIC PANEL
Anion gap: 6 (ref 5–15)
Anion gap: 4 — ABNORMAL LOW (ref 5–15)
Anion gap: 6 (ref 5–15)
BUN: 35 mg/dL — ABNORMAL HIGH (ref 8–23)
BUN: 32 mg/dL — ABNORMAL HIGH (ref 8–23)
BUN: 27 mg/dL — ABNORMAL HIGH (ref 8–23)
CO2: 23 mmol/L (ref 22–32)
CO2: 21 mmol/L — ABNORMAL LOW (ref 22–32)
CO2: 20 mmol/L — ABNORMAL LOW (ref 22–32)
Calcium: 10.4 mg/dL — ABNORMAL HIGH (ref 8.9–10.3)
Calcium: 9.7 mg/dL (ref 8.9–10.3)
Calcium: 9.7 mg/dL (ref 8.9–10.3)
Chloride: 119 mmol/L — ABNORMAL HIGH (ref 98–111)
Chloride: 118 mmol/L — ABNORMAL HIGH (ref 98–111)
Chloride: 116 mmol/L — ABNORMAL HIGH (ref 98–111)
Creatinine, Ser: 1.04 mg/dL (ref 0.61–1.24)
Creatinine, Ser: 0.78 mg/dL (ref 0.61–1.24)
Creatinine, Ser: 0.81 mg/dL (ref 0.61–1.24)
GFR, Estimated: >60 mL/min (ref >60)
GFR, Estimated: >60 mL/min (ref >60)
GFR, Estimated: >60 mL/min (ref >60)
Glucose, Bld: 211 mg/dL — ABNORMAL HIGH (ref 70–99)
Glucose, Bld: 195 mg/dL — ABNORMAL HIGH (ref 70–99)
Glucose, Bld: 177 mg/dL — ABNORMAL HIGH (ref 70–99)
Potassium: 3.8 mmol/L (ref 3.5–5.1)
Potassium: 3.5 mmol/L (ref 3.5–5.1)
Potassium: 3.7 mmol/L (ref 3.5–5.1)
Sodium: 148 mmol/L — ABNORMAL HIGH (ref 135–145)
Sodium: 143 mmol/L (ref 135–145)
Sodium: 142 mmol/L (ref 135–145)

## 2021-03-28 LAB — URINE CULTURE: Culture: NO GROWTH

## 2021-03-28 LAB — CBC
HCT: 40.7 % (ref 39.0–52.0)
Hemoglobin: 13.2 g/dL (ref 13.0–17.0)
MCH: 32.8 pg (ref 26.0–34.0)
MCHC: 32.4 g/dL (ref 30.0–36.0)
MCV: 101.2 fL — ABNORMAL HIGH (ref 80.0–100.0)
Platelets: 143 10*3/uL — ABNORMAL LOW (ref 150–400)
RBC: 4.02 MIL/uL — ABNORMAL LOW (ref 4.22–5.81)
RDW: 13.4 % (ref 11.5–15.5)
WBC: 13.5 10*3/uL — ABNORMAL HIGH (ref 4.0–10.5)
nRBC: 0 % (ref 0.0–0.2)

## 2021-03-28 LAB — LACTIC ACID, PLASMA
Lactic Acid, Venous: 2 mmol/L (ref 0.5–1.9)
Lactic Acid, Venous: 2.1 mmol/L (ref 0.5–1.9)

## 2021-03-28 LAB — T4, FREE: Free T4: 0.97 ng/dL (ref 0.61–1.12)

## 2021-03-28 LAB — TSH: TSH: 4.637 u[IU]/mL — ABNORMAL HIGH (ref 0.350–4.500)

## 2021-03-28 LAB — IRON AND TIBC
Iron: 88 ug/dL (ref 45–182)
Saturation Ratios: 69 % — ABNORMAL HIGH (ref 17.9–39.5)
TIBC: 129 ug/dL — ABNORMAL LOW (ref 250–450)
UIBC: 41 ug/dL

## 2021-03-28 LAB — C-REACTIVE PROTEIN: CRP: 0.7 mg/dL (ref <1.0)

## 2021-03-28 LAB — MAGNESIUM: Magnesium: 1.5 mg/dL — ABNORMAL LOW (ref 1.7–2.4)

## 2021-03-28 LAB — AMMONIA: Ammonia: 28 umol/L (ref 9–35)

## 2021-03-28 LAB — VITAMIN B12: Vitamin B-12: 356 pg/mL (ref 180–914)

## 2021-03-28 MED ORDER — THIAMINE HCL 100 MG/ML IJ SOLN
100.0000 mg | Freq: Every day | INTRAMUSCULAR | Status: DC
  Administered 2021-03-28 – 2021-04-07 (×11): 100 mg via INTRAVENOUS
  Filled 2021-03-28 (×11): qty 2

## 2021-03-28 MED ORDER — CHLORHEXIDINE GLUCONATE CLOTH 2 % EX PADS
6.0000 | MEDICATED_PAD | Freq: Every day | CUTANEOUS | Status: DC
  Administered 2021-03-29 – 2021-04-06 (×9): 6 via TOPICAL

## 2021-03-28 MED ORDER — VANCOMYCIN HCL 1000 MG/200ML IV SOLN: 1000.0000 mg | Freq: Once | INTRAVENOUS | Status: DC

## 2021-03-28 MED ORDER — PANTOPRAZOLE SODIUM 40 MG IV SOLR
20.0000 mg | Freq: Once | INTRAVENOUS | Status: AC
  Administered 2021-03-28: 20 mg via INTRAVENOUS
  Filled 2021-03-28: qty 40

## 2021-03-28 MED ORDER — VANCOMYCIN HCL 500 MG/100ML IV SOLN
500.0000 mg | Freq: Two times a day (BID) | INTRAVENOUS | Status: DC
  Administered 2021-03-28 – 2021-03-31 (×6): 500 mg via INTRAVENOUS
  Filled 2021-03-28 (×6): qty 100

## 2021-03-28 MED ORDER — SODIUM CHLORIDE 0.9 % IV SOLN
2.0000 g | Freq: Four times a day (QID) | INTRAVENOUS | Status: DC
  Administered 2021-03-28 – 2021-03-31 (×12): 2 g via INTRAVENOUS
  Filled 2021-03-28 (×2): qty 2
  Filled 2021-03-28: qty 2000
  Filled 2021-03-28: qty 2
  Filled 2021-03-28 (×4): qty 2000
  Filled 2021-03-28: qty 2
  Filled 2021-03-28 (×3): qty 2000
  Filled 2021-03-28: qty 2
  Filled 2021-03-28: qty 2000
  Filled 2021-03-28: qty 2

## 2021-03-28 MED ORDER — VANCOMYCIN HCL 1000 MG/200ML IV SOLN: 1000.0000 mg | INTRAVENOUS | Status: DC

## 2021-03-28 MED ORDER — SODIUM CHLORIDE 0.9 % IV SOLN
2.0000 g | Freq: Two times a day (BID) | INTRAVENOUS | Status: DC
  Administered 2021-03-28: 2 g via INTRAVENOUS
  Filled 2021-03-28 (×2): qty 2

## 2021-03-28 MED ORDER — MAGNESIUM SULFATE 2 GM/50ML IV SOLN
2.0000 g | Freq: Once | INTRAVENOUS | Status: AC
  Administered 2021-03-28: 2 g via INTRAVENOUS
  Filled 2021-03-28: qty 50

## 2021-03-28 MED ORDER — FLUCONAZOLE 100MG IVPB
100.0000 mg | INTRAVENOUS | Status: DC
  Administered 2021-03-28 – 2021-03-30 (×3): 100 mg via INTRAVENOUS
  Filled 2021-03-28 (×3): qty 50

## 2021-03-28 MED ORDER — DEXTROSE-NACL 5-0.45 % IV SOLN: INTRAVENOUS | Status: DC

## 2021-03-28 MED ORDER — DEXTROSE IN LACTATED RINGERS 5 % IV SOLN: INTRAVENOUS | Status: DC

## 2021-03-28 MED ORDER — SODIUM CHLORIDE 0.9 % IV SOLN
2.0000 g | Freq: Two times a day (BID) | INTRAVENOUS | Status: DC
  Administered 2021-03-28 – 2021-03-30 (×5): 2 g via INTRAVENOUS
  Filled 2021-03-28: qty 20
  Filled 2021-03-28: qty 2
  Filled 2021-03-28: qty 20
  Filled 2021-03-28: qty 2
  Filled 2021-03-28 (×3): qty 20

## 2021-03-28 MED ORDER — CYANOCOBALAMIN 1000 MCG/ML IJ SOLN
1000.0000 ug | Freq: Every day | INTRAMUSCULAR | Status: AC
  Administered 2021-03-29 – 2021-03-31 (×3): 1000 ug via SUBCUTANEOUS
  Filled 2021-03-28 (×3): qty 1

## 2021-03-28 NOTE — Progress Notes (Signed)
Pharmacy Antibiotic Note  Robert Gross is a 87 y.o. male admitted on 04/04/2021 with medical history significant for communicating hydrocephalus s/p VP shunt, hypertension and type 2 diabetes who presents with family for concerns of altered mental status.  Pharmacy was originally consulted 5/6 to dose vancomycin and cefepime for sepsis.  5/7 abx dosing requested for Meningitis  Plan: Vancomycin 500mg  IV q12 - aiming for trough 15-20 mcg/ml Ceftriaxone 2gm q12 Ampicillin 2gm IV q6  Height: 5\' 8"  (172.7 cm) Weight: 69 kg (152 lb 1.9 oz) IBW/kg (Calculated) : 68.4  Temp (24hrs), Avg:97.6 F (36.4 C), Min:97.4 F (36.3 C), Max:98 F (36.7 C)  Recent Labs  Lab 03/24/2021 1406 03/24/2021 1603 03/26/2021 1857 04/12/2021 2214 04/01/2021 2344 03/28/21 0349 03/28/21 1318  WBC 14.7*  --   --   --   --  13.5*  --   CREATININE 1.08  --   --  1.11  --  1.04 0.78  LATICACIDVEN  --  5.0* 3.1* 3.5* 2.0* 2.1*  --     Estimated Creatinine Clearance: 59.4 mL/min (by C-G formula based on SCr of 0.78 mg/dL).    No Known Allergies  Antimicrobials this admission: 5/6 Cefepime >> 5/7 5/6 Flagyl >> 5/7 5/6 Vanc >> 5/6 CTX >> 5/7 Ampicillin >>  Dose adjustments this admission:  Microbiology results: 5/6 BCx: ngtd 5/6 UCx: ng-final  Thank you for allowing pharmacy to be a part of this patient's care.  Minda Ditto PharmD 03/28/2021, 3:11 PM

## 2021-03-28 NOTE — Plan of Care (Signed)
  Problem: Activity: Goal: Risk for activity intolerance will decrease Outcome: Adequate for Discharge   Problem: Elimination: Goal: Will not experience complications related to bowel motility Outcome: Adequate for Discharge   

## 2021-03-28 NOTE — Evaluation (Signed)
Clinical/Bedside Swallow Evaluation Patient Details  Name: Robert Gross MRN: [id number] Date of Birth: 12/22/1928  Today's Date: 03/28/2021 Time: SLP Start Time (ACUTE ONLY): 1535 SLP Stop Time (ACUTE ONLY): 1625 SLP Time Calculation (min) (ACUTE ONLY): 50 min  Past Medical History:  Past Medical History:  Diagnosis Date  . Arthritis   . BPH (benign prostatic hyperplasia)   . BPH with urinary obstruction   . Cataracts, bilateral   . Dizzy spells    residual from concussion 09-05-2017 intermittantly when turns head but stated as of 09-26-2017 no issues for past 2-3 days  . Essential hypertension   . Gait abnormality   . GERD (gastroesophageal reflux disease)   . History of concussion    09-05-2017  w/ loc --- per pt residual intermittant dizziness when he turns his head either way  . History of prostatitis   . History of sepsis    09-12-2017  urosepsis  . History of urinary retention   . Hydrocephalus ([place])   . Hypercalcemia   . Hyperlipemia   . Memory loss   . Metabolic encephalopathy   . Osteopenia   . Squamous cell carcinoma in situ    multiple spots  . Type 2 diabetes mellitus ([place])   . Urinary retention    requires intermittent caths  . Vitamin D deficiency   . Vocal cord paralysis   . Wears partial dentures    upper   Past Surgical History:  Past Surgical History:  Procedure Laterality Date  . CATARACT EXTRACTION W/ INTRAOCULAR LENS  IMPLANT, BILATERAL  03/2017  . EXICISION EPIDERMAL CYST LEFT THUMB  09-12-2001   dr sypher  [facility]  . INGUINAL HERNIA REPAIR Right 11-07-2007   dr Dalbert Batman [facility]  . KNEE ARTHROSCOPY Right 1990s  . MIDDLE EAR SURGERY     cyst removal  . SQUAMOUS CELL CARCINOMA EXCISION    . TRANSURETHRAL RESECTION OF PROSTATE  07-26-2000  dr Amalia Hailey [facility]  . TRANSURETHRAL RESECTION OF PROSTATE N/A 09/28/2017   Procedure: BIPOLAR TRANSURETHRAL RESECTION OF THE PROSTATE (TURP);  Surgeon: Lucas Mallow, MD;  Location: [facility];   Service: Urology;  Laterality: N/A;  . VENTRICULOPERITONEAL SHUNT Right 04/08/2020   Procedure: Shunt Placment - right occipital;  Surgeon: Earnie Larsson, MD;  Location: [place];  Service: Neurosurgery;  Laterality: Right;   HPI:  Per admitting MD notes:  "Robert Gross is a 87 y.o. male with medical history significant for communicating hydrocephalus s/p VP shunt, hypertension and type 2 diabetes who presents with family for concerns of altered mental status.     Patient unable to provide HPI due to altered mental status.  Per MD notes, daughter [name] advised her that pt was doing well about 6 weeks ago he was doing well and went out to eat with family.  In March he was diagnosed with an ear infection and was started on antibiotics and 2 weeks of prednisone. During the steroid course he had hypentension and hyperglycemia up to 400s. Once the prednisone was stopped patient began to have a sharp decline in his health and had progressive weakness".     A CT head follow up for his shunt done on 4/5 showed a new subacute infarct and pt with recent UTI - culture growing yeast and was placed on fluconazole per Md notes.  Last Thursday he also saw neurosurgery and had his VP shunt readjusted per spouse. Starting Friday he could no longer get out of bed and had  difficulty eating.  Wife states pt has been sleeping most of the day for approx the last month and daughter admits to this SLP that pt has undergone significant weight loss.   Swallow evaluation ordered.  CT head 03/30/2021  showed Mildly dilated lateral ventricles slightly greater on LEFT,  minimally increased from previous exam. Chest Xray showed Mildly decreased lung volumes are seen which is likely, in part,  secondary to the degree of patient inspiration. There is no evidence  of acute infiltrate, pleural effusion or pneumothorax. Mild  elevation of the right hemidiaphragm is noted.   Pt is a DNR.   Assessment / Plan / Recommendation Clinical Impression  Pt  currently presents with severe deconditioning and AMS.  He does not follow directions (x once of approx 20 opportunities), is resistant to oral care and very xerostomic.  SLP provided pt with 2 tsps and a single cup boluses of nectar thick water after he refused oral care.  Clinically pt with severely delayed and multiple swallows followed by congested cough concerning for airway infiltration.  Pt then allowed SLP to view oral cavity 0 minimally and SLP able to remove viscous yellow tinged secretions adhered to soft palate midline and on left - He did not allow SLP to clear right as he pushed SLPs hand away from him. SLP provided verbal comfort in informing him to need to clear bacteria laden secretions.  Pt did not turn his head to midline despite verbal visual cues from spouse and daughter. Wife assisted to turn midline - to which pt winced and groaned - but was able to remain in position without furhter discomfort evident.  Due to this acute change *pt able to follow directions last week per family*, recommend NPO and oral care for moisture.  Educated family to recommendations and concerns for premorbid dysphagia.  Also reviewed use of oral suction to allow their use prn - in anterior oral cavity.    SLP Visit Diagnosis: Dysphagia, oropharyngeal phase (R13.12)    Aspiration Risk  Severe aspiration risk;Risk for inadequate nutrition/hydration    Diet Recommendation NPO;Free water protocol after oral care   Medication Administration: Via alternative means Compensations: Slow rate;Small sips/bites Postural Changes: Seated upright at 90 degrees    Other  Recommendations Oral Care Recommendations: Oral care QID Other Recommendations: Have oral suction available   Follow up Recommendations        Frequency and Duration min 1 x/week  1 week       Prognosis Prognosis for Safe Diet Advancement: Guarded Barriers to Reach Goals: Severity of deficits      Swallow Study   General Date of Onset:  03/28/21 HPI: Per admitting MD notes:  "NUH Gross is a 87 y.o. male with medical history significant for communicating hydrocephalus s/p VP shunt, hypertension and type 2 diabetes who presents with family for concerns of altered mental status.     Patient unable to provide HPI due to altered mental status.  Per MD notes, daughter [name] advised her that pt was doing well about 6 weeks ago he was doing well and went out to eat with family.  In March he was diagnosed with an ear infection and was started on antibiotics and 2 weeks of prednisone. During the steroid course he had hypentension and hyperglycemia up to 400s. Once the prednisone was stopped patient began to have a sharp decline in his health and had progressive weakness".     A CT head follow up for his shunt  done on 4/5 showed a new subacute infarct and pt with recent UTI - culture growing yeast and was placed on fluconazole per Md notes.  Last Thursday he also saw neurosurgery and had his VP shunt readjusted per spouse. Starting Friday he could no longer get out of bed and had difficulty eating.  Wife states pt has been sleeping most of the day for approx the last month and daughter admits to this SLP that pt has undergone significant weight loss.   Swallow evaluation ordered.  CT head 30-Mar-2021  showed Mildly dilated lateral ventricles slightly greater on LEFT,  minimally increased from previous exam. Chest Xray showed Mildly decreased lung volumes are seen which is likely, in part,  secondary to the degree of patient inspiration. There is no evidence  of acute infiltrate, pleural effusion or pneumothorax. Mild  elevation of the right hemidiaphragm is noted.   Pt is a DNR. Type of Study: Bedside Swallow Evaluation Diet Prior to this Study: NPO Temperature Spikes Noted: No Respiratory Status: Room air History of Recent Intubation: No Behavior/Cognition: Lethargic/Drowsy;Doesn't follow directions;Distractible;Confused;Other (Comment) (pt is not  turning his head to the right - turned to the left and wife reports she feeds him on that side, physical assist to turn head midline given by wife caused pt to wince and groan - from conversation with wife  head left for one week) Oral Cavity Assessment: Excessive secretions;Other (comment) (yellow tinged viscous secretions - orally suctioned to remove after minimal intake) Oral Care Completed by SLP: Other (Comment) (as much as pt would allow) Oral Cavity - Dentition: Missing dentition Self-Feeding Abilities: Total assist Patient Positioning: Upright in bed Baseline Vocal Quality: Low vocal intensity;Other (comment) (very low phonation - nearly aphonic) Volitional Cough: Cognitively unable to elicit Volitional Swallow: Unable to elicit    Oral/Motor/Sensory Function Overall Oral Motor/Sensory Function: Other (comment) (pt did not follow directions, sealed lips on suction, protruded tongue x1 midline)   Ice Chips Ice chips: Not tested   Thin Liquid Thin Liquid: Impaired (moisture via toothette)    Nectar Thick Nectar Thick Liquid: Impaired Presentation: Cup;Spoon Oral Phase Impairments: Reduced labial seal;Reduced lingual movement/coordination Oral phase functional implications: Prolonged oral transit Pharyngeal Phase Impairments: Suspected delayed Swallow;Decreased hyoid-laryngeal movement;Multiple swallows;Cough - Delayed   Honey Thick Honey Thick Liquid: Not tested   Puree Puree: Not tested   Solid     Solid: Not tested      Macario Golds 03/28/2021,4:53 PM   Kathleen Lime, MS [place] Office [phone] Pager [phone]

## 2021-03-28 NOTE — Progress Notes (Signed)
Pharmacy Antibiotic Note  Robert Gross is a 87 y.o. male admitted on 04/07/2021 with medical history significant for communicating hydrocephalus s/p VP shunt, hypertension and type 2 diabetes who presents with family for concerns of altered mental status.  Pharmacy has been consulted to dose vancomycin and cefepime for sepsis.  1st doses given earlier in ED  Plan: Vancomycin 1gm IV q24h (AUC 504.3, Scr 1.11) Cefepime 2gm IV q12h Flagyl 500mg  IV q8h per MD Follow renal function, cultures and clinical course   Weight: 69 kg (152 lb 1.9 oz)  Temp (24hrs), Avg:98.8 F (37.1 C), Min:98 F (36.7 C), Max:99.6 F (37.6 C)  Recent Labs  Lab 04/20/2021 1403 04/06/2021 1406 04/12/2021 1603 03/31/2021 1857 03/26/2021 2214  WBC  --  14.7*  --   --   --   CREATININE  --  1.08  --   --  1.11  LATICACIDVEN 4.1*  --  5.0* 3.1* 3.5*    CrCl cannot be calculated (Unknown ideal weight.).    No Known Allergies  Antimicrobials this admission: vanc 5/6 >> Cefepime 5/6 >> Flagyl 5/6 >> CTX 5/6 x 1 Dose adjustments this admission:   Microbiology results: 5/6 BCx:  5/6 UCx:   Thank you for allowing pharmacy to be a part of this patient's care.  Dolly Rias RPh 03/28/2021, 12:05 AM

## 2021-03-28 NOTE — Progress Notes (Signed)
The On-call PCP was paged to get an order for the route of Protonix to be changed from oral to IV.  Awaiting new orders

## 2021-03-28 NOTE — Progress Notes (Signed)
[facility] Progress Note  Patient: Robert Gross    SEG:[id number]  DOA: 04-14-21     Date of Service: the patient was seen and examined on 03/28/2021  Brief hospital course: Past medical history of communicating hydrocephalus SP VP shunt, HTN, type II DM, BPH, GERD, recent CVA, HLD. Presents with complaints of progressively worsening confusion and weakness and poor p.o. intake. Concern is about infection/meningitis/UTI.  Versus acute metabolic encephalopathy. Will require further neurological work-up. Currently plan is transfer to [facility] further neurological work-up, if the work-up is unremarkable may require neurologic consultation and neurosurgical consultation for his hydrocephalus.  Assessment and Plan: Acute metabolic encephalopathy At his baseline 6 weeks ago this patient was able to ambulate without any assistance was able to feed himself and would be able to carry out a normal conversation. Currently patient is bedbound unable to speak unable to follow any command, unable to swallow safely. During this timeline patient has undergone modification of his VP shunt pressure, being treated with antibiotics for ear infection as well as treated with Cipro and fluconazole for UTI. No other change reported in his medical therapy. Suspect his encephalopathy is in the setting of metabolic issues including hyponatremia, poor p.o. intake as well as dehydration. Also UTI is likely contributing to this presentation as well. Subacute stroke seen on CT scan in March can also be contributing to this. Nonconvulsive status epilepticus picture also can be contributing. With his neck stiffness meningitis is also highly in differential. Discussed with neurosurgery, Dr. Christella Noa, his hydrocephalus is less likely to be cause of his presentation.  Also recommend LP instead of obtaining a CSF sample from the patient as it increases the risk for infecting the VP shunt. B12 level relatively  low. B1 currently pending. TSH almost normal, T4 currently pending. CRP normal. Ammonia 28. Sodium 151, now improving almost back to normal.  At present continue to monitor BMP every 8 hours. Changing D5-D5 half-normal saline and now that the sodium is corrected, changing to D5 LR. Replace B12 replace B1. LP ordered. Follow-up on urine culture and blood culture. Chest x-ray negative for pneumonia.  Currently remains n.p.o. Check EEG. If the work-up is unremarkable and there is no improvement in patient's condition in the next 24 to 48 hours, patient will require neuro-neurosurgery consultation.  Sepsis.POA.  Due to UTI,, concern for meningitis No septic shock. On presentation patient had tachycardia and tachypnea and leukocytosis with lactic acidosis. Meeting SIRS criteria. Potential source of infection is UTI as well as potentially meningitis. Currently on IV antibiotics, vancomycin, ampicillin as well as ceftriaxone. Less likely herpes therefore not initiating acyclovir. Follow-up on cultures.  AKI Cr of 1.08 from 0.93 Hold Lisinopril for now monitor with repeat labs after fluids  Hypernatremia Likely secondary to dehydration due to poor p.o. intake. Monitor BMP for now.  Hypercalcemia downward trended with fluids continue to monitor with repeat lab   Dysphagia Speech therapy consulted. Patient was receiving pured diet prior to admission. Currently remains n.p.o. as a speech therapy is unable to clear the patient for safe swallow. If the patient is unable to improve the next 48 hours recommended to family and the patient will not be a good candidate for PEG tube placement.  Hx of communicating hydrocephalus s/p VP shunt Mild dilation of the ventricle compared to prior imaging. 2/14 had a fall, CT head shows SDH and a subacute stroke.  SDH was thought to be secondary to over shunting and shunt pressure was reduced. 3/17  repeat CT scan shows improvement in SDH.   Per family shunting was normalized. 5/3 neurosurgery reduced shunting again.  HTN Monitor for now. Blood pressure stable.  Type 2 DM, -Mild hyperglycemia - monitor with morning lab   Hx of CVA  - pt noted to have subacute infarct in CT head back in April -Aspirin was started.  Currently on hold.  Monitor.  Diet: Currently remains NPO. DVT Prophylaxis:   enoxaparin (LOVENOX) injection 40 mg Start: 04/23/21 2200   Advance goals of care discussion: DNR  Family Communication: family was present at bedside, at the time of interview.  The pt provided permission to discuss medical plan with the family. Opportunity was given to ask question and all questions were answered satisfactorily.   Disposition:  Status is: Inpatient  Remains inpatient appropriate because:Altered mental status, Ongoing diagnostic testing needed not appropriate for outpatient work up and IV treatments appropriate due to intensity of illness or inability to take PO  Dispo: The patient is from: Home              Anticipated d/c is to: to be determined              Patient currently is not medically stable to d/c.   Difficult to place patient No  Subjective: Patient was initially unresponsive.  Not following any commands.  No nausea no vomiting.  No acute events overnight.  No seizure-like events identified.  No tremors. On repeat evaluation patient still minimally responsive but answered no when I ask him whether he has any pain.  When I ask him can you move your neck, he said I cannot.  When I ask him to squeeze my finger he said I cannot.  Physical Exam:  General: Appear in moderate distress, no Rash; Oral Mucosa Clear, dry. no Abnormal Neck Mass Or lumps, Conjunctiva normal  Cardiovascular: S1 and S2 Present, no Murmur, Respiratory: increased respiratory effort, Bilateral Air entry present and CTA, no Crackles, no wheezes Abdomen: Bowel Sound present, Soft and difficult to assess  tenderness Extremities:  no Pedal edema Neurology: lethargic and not oriented to time, place, and person affect flat in affect.  Incoherent speech. Pupils are reactive. Further exam difficult.  Withdraws to painful stimuli. Difficult to elicit reflexes. Gait not checked due to patient safety concerns  Vitals:   04/23/2021 2126 03/28/21 0836 03/28/21 1159 03/28/21 1400  BP: (!) 162/80 134/89 (!) 150/70   Pulse: 82 68 64   Resp: 18 18 20    Temp: 98 F (36.7 C) (!) 97.5 F (36.4 C) (!) 97.4 F (36.3 C)   TempSrc: Oral Axillary Oral   SpO2: 98% 98% 97%   Weight:    69 kg  Height:    5\' 8"  (1.727 m)    Intake/Output Summary (Last 24 hours) at 03/28/2021 1831 Last data filed at 03/28/2021 1758 Gross per 24 hour  Intake 1503.05 ml  Output 1100 ml  Net 403.05 ml   Filed Weights   04-23-21 1401 03/28/21 1400  Weight: 69 kg 69 kg    Data Reviewed: I have personally reviewed and interpreted daily labs, tele strips, imaging. I reviewed all nursing notes, pharmacy notes, vitals, pertinent old records I have discussed plan of care as described above with RN and patient/family.  CBC: Recent Labs  Lab 04/23/2021 1406 03/28/21 0349  WBC 14.7* 13.5*  NEUTROABS 12.2*  --   HGB 15.8 13.2  HCT 48.1 40.7  MCV 99.0 101.2*  PLT 184 143*  Basic Metabolic Panel: Recent Labs  Lab 04/07/2021 1406 03/23/2021 2214 03/28/21 0349 03/28/21 1318  NA 150* 151* 148* 143  K 4.4 4.3 3.8 3.5  CL 122* 121* 119* 118*  CO2 21* 23 23 21*  GLUCOSE 171* 150* 211* 195*  BUN 45* 41* 35* 32*  CREATININE 1.08 1.11 1.04 0.78  CALCIUM 11.5* 10.6* 10.4* 9.7  MG  --   --   --  1.5*    Studies: No results found.  Scheduled Meds: . enoxaparin (LOVENOX) injection  40 mg Subcutaneous QHS   Continuous Infusions: . ampicillin (OMNIPEN) IV 2 g (03/28/21 1453)  . cefTRIAXone (ROCEPHIN)  IV    . dextrose 5% lactated ringers 75 mL/hr at 03/28/21 1828  . fluconazole (DIFLUCAN) IV 100 mg (03/28/21 1353)  . vancomycin 500 mg (03/28/21  1554)   PRN Meds:   Time spent: 35 minutes  Author: Berle Mull, MD [facility] 03/28/2021 6:31 PM  To reach On-call, see care teams to locate the attending and reach out via [email or web address]. Between 7PM-7AM, please contact night-coverage If you still have difficulty reaching the attending provider, please page the [facility] (Director on Call) for [facility] on amion for assistance.

## 2021-03-29 ENCOUNTER — Inpatient Hospital Stay (HOSPITAL_COMMUNITY): Payer: Medicare PPO

## 2021-03-29 DIAGNOSIS — I10 Essential (primary) hypertension: Secondary | ICD-10-CM | POA: Diagnosis not present

## 2021-03-29 DIAGNOSIS — Z982 Presence of cerebrospinal fluid drainage device: Secondary | ICD-10-CM | POA: Diagnosis not present

## 2021-03-29 DIAGNOSIS — G910 Communicating hydrocephalus: Secondary | ICD-10-CM | POA: Diagnosis not present

## 2021-03-29 DIAGNOSIS — E119 Type 2 diabetes mellitus without complications: Secondary | ICD-10-CM | POA: Diagnosis not present

## 2021-03-29 DIAGNOSIS — G9341 Metabolic encephalopathy: Secondary | ICD-10-CM | POA: Diagnosis not present

## 2021-03-29 DIAGNOSIS — Z8673 Personal history of transient ischemic attack (TIA), and cerebral infarction without residual deficits: Secondary | ICD-10-CM

## 2021-03-29 LAB — BASIC METABOLIC PANEL
Anion gap: 2 — ABNORMAL LOW (ref 5–15)
Anion gap: 4 — ABNORMAL LOW (ref 5–15)
BUN: 18 mg/dL (ref 8–23)
BUN: 16 mg/dL (ref 8–23)
CO2: 23 mmol/L (ref 22–32)
CO2: 23 mmol/L (ref 22–32)
Calcium: 9.9 mg/dL (ref 8.9–10.3)
Calcium: 9.3 mg/dL (ref 8.9–10.3)
Chloride: 118 mmol/L — ABNORMAL HIGH (ref 98–111)
Chloride: 115 mmol/L — ABNORMAL HIGH (ref 98–111)
Creatinine, Ser: 0.68 mg/dL (ref 0.61–1.24)
Creatinine, Ser: 0.62 mg/dL (ref 0.61–1.24)
GFR, Estimated: >60 mL/min (ref >60)
GFR, Estimated: >60 mL/min (ref >60)
Glucose, Bld: 153 mg/dL — ABNORMAL HIGH (ref 70–99)
Glucose, Bld: 160 mg/dL — ABNORMAL HIGH (ref 70–99)
Potassium: 3.5 mmol/L (ref 3.5–5.1)
Potassium: 3 mmol/L — ABNORMAL LOW (ref 3.5–5.1)
Sodium: 143 mmol/L (ref 135–145)
Sodium: 142 mmol/L (ref 135–145)

## 2021-03-29 LAB — COMPREHENSIVE METABOLIC PANEL
ALT: 13 U/L (ref 0–44)
AST: 14 U/L — ABNORMAL LOW (ref 15–41)
Albumin: 2 g/dL — ABNORMAL LOW (ref 3.5–5.0)
Alkaline Phosphatase: 97 U/L (ref 38–126)
Anion gap: 4 — ABNORMAL LOW (ref 5–15)
BUN: 21 mg/dL (ref 8–23)
CO2: 25 mmol/L (ref 22–32)
Calcium: 9.8 mg/dL (ref 8.9–10.3)
Chloride: 116 mmol/L — ABNORMAL HIGH (ref 98–111)
Creatinine, Ser: 0.76 mg/dL (ref 0.61–1.24)
GFR, Estimated: >60 mL/min (ref >60)
Glucose, Bld: 153 mg/dL — ABNORMAL HIGH (ref 70–99)
Potassium: 4 mmol/L (ref 3.5–5.1)
Sodium: 145 mmol/L (ref 135–145)
Total Bilirubin: 0.3 mg/dL (ref 0.3–1.2)
Total Protein: 4.5 g/dL — ABNORMAL LOW (ref 6.5–8.1)

## 2021-03-29 LAB — CBC WITH DIFFERENTIAL/PLATELET
Abs Immature Granulocytes: 0.27 10*3/uL — ABNORMAL HIGH (ref 0.00–0.07)
Basophils Absolute: 0.1 10*3/uL (ref 0.0–0.1)
Basophils Relative: 1 %
Eosinophils Absolute: 1.2 10*3/uL — ABNORMAL HIGH (ref 0.0–0.5)
Eosinophils Relative: 10 %
HCT: 39.6 % (ref 39.0–52.0)
Hemoglobin: 13.3 g/dL (ref 13.0–17.0)
Immature Granulocytes: 2 %
Lymphocytes Relative: 10 %
Lymphs Abs: 1.2 10*3/uL (ref 0.7–4.0)
MCH: 32.8 pg (ref 26.0–34.0)
MCHC: 33.6 g/dL (ref 30.0–36.0)
MCV: 97.8 fL (ref 80.0–100.0)
Monocytes Absolute: 0.7 10*3/uL (ref 0.1–1.0)
Monocytes Relative: 6 %
Neutro Abs: 8.3 10*3/uL — ABNORMAL HIGH (ref 1.7–7.7)
Neutrophils Relative %: 71 %
Platelets: 134 10*3/uL — ABNORMAL LOW (ref 150–400)
RBC: 4.05 MIL/uL — ABNORMAL LOW (ref 4.22–5.81)
RDW: 13.2 % (ref 11.5–15.5)
WBC: 11.7 10*3/uL — ABNORMAL HIGH (ref 4.0–10.5)
nRBC: 0 % (ref 0.0–0.2)

## 2021-03-29 LAB — GLUCOSE, CAPILLARY: Glucose-Capillary: 151 mg/dL — ABNORMAL HIGH (ref 70–99)

## 2021-03-29 LAB — MAGNESIUM: Magnesium: 2 mg/dL (ref 1.7–2.4)

## 2021-03-29 MED ORDER — LABETALOL HCL 5 MG/ML IV SOLN
10.0000 mg | INTRAVENOUS | Status: DC | PRN
  Administered 2021-03-29 – 2021-03-31 (×3): 10 mg via INTRAVENOUS
  Filled 2021-03-29 (×3): qty 4

## 2021-03-29 MED ORDER — HYDRALAZINE HCL 10 MG PO TABS: 10.0000 mg | ORAL_TABLET | Freq: Three times a day (TID) | ORAL | Status: DC

## 2021-03-29 NOTE — Plan of Care (Signed)
  Problem: Education: Goal: Knowledge of General Education information will improve Description: Including pain rating scale, medication(s)/side effects and non-pharmacologic comfort measures Outcome: Progressing   Problem: Health Behavior/Discharge Planning: Goal: Ability to manage health-related needs will improve Outcome: Progressing   Problem: Activity: Goal: Risk for activity intolerance will decrease Outcome: Progressing   Problem: Nutrition: Goal: Adequate nutrition will be maintained Outcome: Progressing   Problem: Elimination: Goal: Will not experience complications related to bowel motility Outcome: Progressing Goal: Will not experience complications related to urinary retention Outcome: Progressing   Problem: Pain Managment: Goal: General experience of comfort will improve Outcome: Progressing   Problem: Safety: Goal: Ability to remain free from injury will improve Outcome: Progressing   Problem: Skin Integrity: Goal: Risk for impaired skin integrity will decrease Outcome: Progressing   

## 2021-03-29 NOTE — Progress Notes (Addendum)
Patient ID: Robert Gross, male   DOB: May 28, 1929, 87 y.o.   MRN: [id number] BP (!) 178/75 (BP Location: Right Arm)   Pulse 72   Temp 97.6 F (36.4 C) (Oral)   Resp 16   Ht 5\' 8"  (1.727 m)   Wt 69.3 kg   SpO2 99%   BMI 23.23 kg/m  Mr. Navejas initially was treated for NPH. He had multiple lumbar punctures prior to his shunt placement. He is not now  shunt dependent. His ventricles are larger than previously because he developed subdural hematomas due to over drainage after the shunt was placed. Therefore his shunt pressure was turned up which is the customary treatment performed by neurosurgeons. This however does not create obstructive hydrocephalus, the pop off pressure is simply higher. It is safe to perform a lumbar puncture. Communicating and obstructive hydrocephalus are not nor have ever been the same thing.

## 2021-03-29 NOTE — Progress Notes (Signed)
EEG complete - results pending 

## 2021-03-29 NOTE — Progress Notes (Signed)
[facility]  PROGRESS NOTE  Robert Gross [id number] DOB: 1929-01-14 DOA: 04/17/2021 PCP: [name], MD   Brief HPI:   87 year old male with history of communicating hydrocephalus status post VP shunt, hypertension, diabetes mellitus type 2, BPH, GERD, recent CVA, hyperlipidemia presented with worsening confusion and weakness and poor p.o. intake.  Patient was recently treated for otitis media by his PCP with Omnicef and prednisone for 2 weeks.  Since then patient has gradually declined with poor p.o. intake.  He was also treated for yeast UTI with fluconazole but no significant improvement so EMS was called and patient brought to hospital.  He also had neurosurgery at just VP shunt pressure on 03/24/2021 without any significant improvement.  Patient transferred to [facility] for further evaluation and likely needing lumbar puncture for possible meningitis.    Subjective   Patient seen and examined, he is alert and responds to questions.  Still not oriented to place person and time.   Assessment/Plan:     1. Acute metabolic encephalopathy-unclear etiology, patient has VP shunt in place, pressure was adjusted by neurosurgery recently on 03/24/2021.  Patient was treated with Omnicef and prednisone for otitis media and since then patient has gradually declined.  CT head showed mild ventricular enlargement, mild progression since 02/05/2021.  Resolving bilateral subdural fluid collections.  Interval development of hypodensity left basal ganglia compatible with subacute infarct.  Urine culture showed no growth.  Chest x-ray was unremarkable.  Patient is on empiric antibiotics ceftriaxone, ampicillin, vancomycin for possible meningitis.  Neurology has been consulted for possible lumbar puncture today.  He is not on acyclovir due to low probability for herpes encephalitis.  Consider MRI brain if no significant improvement. 2. Acute kidney injury-improved with IV fluids.  Lisinopril  is on hold. 3. Hypernatremia-sodium level was 150 on admission, likely from poor p.o. intake.  It has now improved 145. 4. Hypercalcemia-patient was found to have hypercalcemia with calcium of 11.5.  Continue to improve, today calcium is 9.8. 5. Dysphagia-speech therapy consulted, continues to be n.p.o. due to altered mental status.  Speech therapy will continue to follow and make recommendations. 6. History of communicating hydrocephalus status post VP shunt-CT head shows mild dilation of ventricles compared to prior imaging.  Patient had a fall on 2/14 at that time CT head showed SDH with subacute stroke.  SDH was thought to be due to over shunting and shunt pressure was reduced.  On 3/17 CT scan showed improvement in SDH.  On 03/24/2021, neurosurgery has been reduced shunt pressure. 7. Diabetes mellitus type 2-continue sliding scale insulin with NovoLog. 8. History of CVA-patient found to have subacute infarct and CT head in April 2022.  Aspirin was started.  She is currently on hold. 9. Hypertension-blood pressure is mildly elevated, will restart hydralazine, continue to hold lisinopril. 10. Low vitamin B12 level-B12 level is borderline 356, started on B12 supplementation.  He is also on thiamine 100 mg IV daily.  Thiamine level is currently pending.   Scheduled medications:   . Chlorhexidine Gluconate Cloth  6 each Topical Daily  . cyanocobalamin  1,000 mcg Subcutaneous Daily  . thiamine injection  100 mg Intravenous Daily         Data Reviewed:   CBG:  Recent Labs  Lab 03/29/21 0354  GLUCAP 151*    SpO2: 97 %    Vitals:   03/29/21 0355 03/29/21 0400 03/29/21 0740 03/29/21 1147  BP: (!) 168/93  (!) 155/91 (!) 167/73  Pulse: 78  76 73  Resp: 18  16 17   Temp: 97.8 F (36.6 C)  (!) 97.5 F (36.4 C) (!) 97.5 F (36.4 C)  TempSrc: Oral  Oral Oral  SpO2: 99%  99% 97%  Weight:  69.3 kg    Height:  5\' 8"  (1.727 m)       Intake/Output Summary (Last 24 hours) at 03/29/2021  1251 Last data filed at 03/29/2021 1035 Gross per 24 hour  Intake 247.86 ml  Output 1350 ml  Net -1102.14 ml    05/06 1901 - 05/08 0700 In: 1750.9 [I.V.:1018.3] Out: 1550 [Urine:1550]  Filed Weights   2021/03/28 1401 03/28/21 1400 03/29/21 0400  Weight: 69 kg 69 kg 69.3 kg    CBC:  Recent Labs  Lab Mar 28, 2021 1406 03/28/21 0349 03/29/21 0536  WBC 14.7* 13.5* 11.7*  HGB 15.8 13.2 13.3  HCT 48.1 40.7 39.6  PLT 184 143* 134*  MCV 99.0 101.2* 97.8  MCH 32.5 32.8 32.8  MCHC 32.8 32.4 33.6  RDW 13.4 13.4 13.2  LYMPHSABS 1.2  --  1.2  MONOABS 0.8  --  0.7  EOSABS 0.2  --  1.2*  BASOSABS 0.1  --  0.1    Complete metabolic panel:  Recent Labs  Lab 03/28/2021 1406 28-Mar-2021 1603 2021/03/28 1857 Mar 28, 2021 2214 Mar 28, 2021 2344 03/28/21 0349 03/28/21 1318 03/28/21 2102 03/29/21 0633  NA 150*  --   --  151*  --  148* 143 142 145  K 4.4  --   --  4.3  --  3.8 3.5 3.7 4.0  CL 122*  --   --  121*  --  119* 118* 116* 116*  CO2 21*  --   --  23  --  23 21* 20* 25  GLUCOSE 171*  --   --  150*  --  211* 195* 177* 153*  BUN 45*  --   --  41*  --  35* 32* 27* 21  CREATININE 1.08  --   --  1.11  --  1.04 0.78 0.81 0.76  CALCIUM 11.5*  --   --  10.6*  --  10.4* 9.7 9.7 9.8  AST 16  --   --   --   --   --   --   --  14*  ALT 14  --   --   --   --   --   --   --  13  ALKPHOS 134*  --   --   --   --   --   --   --  97  BILITOT 0.5  --   --   --   --   --   --   --  0.3  ALBUMIN 2.6*  --   --   --   --   --   --   --  2.0*  MG  --   --   --   --   --   --  1.5*  --  2.0  CRP  --   --   --   --   --   --  0.7  --   --   LATICACIDVEN  --  5.0* 3.1* 3.5* 2.0* 2.1*  --   --   --   INR 1.2  --   --   --   --   --   --   --   --   TSH  --   --   --   --   --  4.637*  --   --   --   AMMONIA  --   --   --   --   --   --  28  --   --     No results for input(s): LIPASE, AMYLASE in the last 168 hours.  Recent Labs  Lab Apr 12, 2021 1515 03/28/21 1318  CRP  --  0.7  SARSCOV2NAA NEGATIVE  --      ------------------------------------------------------------------------------------------------------------------ No results for input(s): CHOL, HDL, LDLCALC, TRIG, CHOLHDL, LDLDIRECT in the last 72 hours.  Lab Results  Component Value Date   HGBA1C 5.6 06/12/2020   ------------------------------------------------------------------------------------------------------------------ Recent Labs    03/28/21 0349  TSH 4.637*   ------------------------------------------------------------------------------------------------------------------ Recent Labs    03/28/21 1318  VITAMINB12 356  TIBC 129*  IRON 88    Coagulation profile Recent Labs  Lab Apr 12, 2021 1406  INR 1.2   No results for input(s): DDIMER in the last 72 hours.  Cardiac Enzymes No results for input(s): CKTOTAL, CKMB, CKMBINDEX, TROPONINI in the last 168 hours.  ------------------------------------------------------------------------------------------------------------------ No results found for: BNP   Antibiotics: Anti-infectives (From admission, onward)   Start     Dose/Rate Route Frequency Ordered Stop   03/28/21 2200  cefTRIAXone (ROCEPHIN) 2 g in sodium chloride 0.9 % 100 mL IVPB        2 g 200 mL/hr over 30 Minutes Intravenous Every 12 hours 03/28/21 1244     03/28/21 1800  vancomycin (VANCOREADY) IVPB 1000 mg/200 mL  Status:  Discontinued        1,000 mg 200 mL/hr over 60 Minutes Intravenous Every 24 hours 03/28/21 0006 03/28/21 0757   03/28/21 1600  vancomycin (VANCOREADY) IVPB 500 mg/100 mL        500 mg 100 mL/hr over 60 Minutes Intravenous Every 12 hours 03/28/21 1457     03/28/21 1400  fluconazole (DIFLUCAN) IVPB 100 mg        100 mg 50 mL/hr over 60 Minutes Intravenous Every 24 hours 03/28/21 1239     03/28/21 1330  vancomycin (VANCOREADY) IVPB 1000 mg/200 mL  Status:  Discontinued        1,000 mg 200 mL/hr over 60 Minutes Intravenous  Once 03/28/21 1244 03/28/21 1259   03/28/21 1300   ampicillin (OMNIPEN) 2 g in sodium chloride 0.9 % 100 mL IVPB        2 g 300 mL/hr over 20 Minutes Intravenous Every 6 hours 03/28/21 1244     03/28/21 1000  fluconazole (DIFLUCAN) tablet 100 mg  Status:  Discontinued        100 mg Oral Daily Apr 12, 2021 2341 03/28/21 1239   03/28/21 0800  ceFEPIme (MAXIPIME) 2 g in sodium chloride 0.9 % 100 mL IVPB  Status:  Discontinued        2 g 200 mL/hr over 30 Minutes Intravenous Every 12 hours 03/28/21 0006 03/28/21 1244   03/28/21 0400  metroNIDAZOLE (FLAGYL) IVPB 500 mg  Status:  Discontinued        500 mg 100 mL/hr over 60 Minutes Intravenous Every 8 hours Apr 12, 2021 2346 03/28/21 0757   Apr 12, 2021 1815  metroNIDAZOLE (FLAGYL) IVPB 500 mg        500 mg 100 mL/hr over 60 Minutes Intravenous  Once Apr 12, 2021 1805 Apr 12, 2021 2049   Apr 12, 2021 1815  ceFEPIme (MAXIPIME) 2 g in sodium chloride 0.9 % 100 mL IVPB        2 g 200 mL/hr over 30 Minutes Intravenous  Once Apr 12, 2021 1808 Apr 12, 2021 2049  04/19/2021 1800  vancomycin (VANCOCIN) IVPB 1000 mg/200 mL premix        1,000 mg 200 mL/hr over 60 Minutes Intravenous  Once 04/10/2021 1752 03/29/2021 1951   04/15/2021 1515  cefTRIAXone (ROCEPHIN) 1 g in sodium chloride 0.9 % 100 mL IVPB  Status:  Discontinued        1 g 200 mL/hr over 30 Minutes Intravenous Every 24 hours 04/01/2021 1513 04/19/2021 1808       Radiology Reports  DG Chest 2 View  Result Date: 03/31/2021 CLINICAL DATA:  Cough. EXAM: CHEST - 2 VIEW COMPARISON:  June 12, 2020 FINDINGS: Mildly decreased lung volumes are seen which is likely, in part, secondary to the degree of patient inspiration. There is no evidence of acute infiltrate, pleural effusion or pneumothorax. Mild elevation of the right hemidiaphragm is noted. Ventriculoperitoneal catheter tubing is seen overlying the right lung. The heart size and mediastinal contours are within normal limits. There is moderate severity calcification of the thoracic aorta. Multiple soft tissue calcifications are seen  projecting over the right upper quadrant. The visualized skeletal structures are unremarkable. IMPRESSION: 1. No active cardiopulmonary disease. 2. Cholelithiasis. Electronically Signed   By: Virgina Norfolk M.D.   On: 03/23/2021 15:12   CT Head Wo Contrast  Result Date: 03/26/2021 CLINICAL DATA:  Mental status changes of unknown etiology, history hypertension, hydrocephalus, type II diabetes mellitus, former smoker EXAM: CT HEAD WITHOUT CONTRAST TECHNIQUE: Contiguous axial images were obtained from the base of the skull through the vertex without intravenous contrast. Sagittal and coronal MPR images reconstructed from axial data set. COMPARISON:  03/16/2021 FINDINGS: Brain: Intracranial shunt via of posterior RIGHT parietal approach with tip at the RIGHT foramen of [place]ro. Generalized atrophy. Prominent lateral ventricles, slightly greater on LEFT, minimally increased. No midline shift or mass effect. Small vessel chronic ischemic changes of deep cerebral white matter. Cough no intracranial hemorrhage, mass lesion, or evidence of acute infarction. No extra-axial fluid collections. Vascular: Atherosclerotic calcification of internal carotid and vertebral arteries at skull base. Skull: Intact Sinuses/Orbits: Small chronic LEFT mastoid effusion. Prior partial LEFT mastoidectomy. Small amount of mucus within sphenoid sinus. Paranasal sinuses otherwise clear. Other: N/A IMPRESSION: Mildly dilated lateral ventricles slightly greater on LEFT, minimally increased from previous exam Atrophy with small vessel chronic ischemic changes of deep cerebral white matter. No additional intracranial abnormalities. Electronically Signed   By: Lavonia Dana M.D.   On: 03/26/2021 17:15   EEG adult  Result Date: 03/29/2021 Lora Havens, MD     03/29/2021 11:17 AM Patient Name: Robert Gross MRN: [id number] Epilepsy Attending: Lora Havens Referring Physician/Provider: Dr Berle Mull Date: 03/29/2021 Duration: 26.41 mins  Patient history: 87yo M with ams. EEG to evaluate for seizure. Level of alertness: Awake, asleep AEDs during EEG study: None Technical aspects: This EEG study was done with scalp electrodes positioned according to the 10-20 International system of electrode placement. Electrical activity was acquired at a sampling rate of 500Hz  and reviewed with a high frequency filter of 70Hz  and a low frequency filter of 1Hz . EEG data were recorded continuously and digitally stored. Description: No posterior dominant rhythm was seen. Sleep was characterized by sleep spindles (12 to 14 Hz), maximal frontocentral region. EEG showed continuous generalized 3 to 6 Hz theta-delta slowing. Hyperventilation and photic stimulation were not performed.   ABNORMALITY - Continuous slow, generalized IMPRESSION: This study is suggestive of moderate diffuse encephalopathy, nonspecific etiology. No seizures or epileptiform discharges were seen throughout the recording. Priyanka  Barbra Sarks      DVT prophylaxis: SCDs, start on 03/29/2021  Code Status: DNR  Family Communication: Discussed with patient's daughter at bedside   Consultants:  Neurology  Procedures:      Objective    Physical Examination:    General: Appears in no acute distress  Cardiovascular: S1-S2, regular, no murmur auscultated  Respiratory: Clear to auscultation bilaterally  Abdomen: Abdomen is soft, nontender, no organomegaly  Extremities: No edema in the lower extremities  Neurologic: Alert, following commands, not oriented x3   Status is: Inpatient  Dispo: The patient is from: Home              Anticipated d/c is to: To be decided              Anticipated d/c date is: 04/02/2021              Patient currently not stable for discharge  Barrier to discharge-ongoing evaluation for encephalopathy  COVID-19 Labs  Recent Labs    03/28/21 1318  CRP 0.7    Lab Results  Component Value Date   [place] NEGATIVE 04/07/2021    [place] NEGATIVE 06/12/2020   [place] NEGATIVE 04/04/2020   [place] NEGATIVE 01/11/2020    Microbiology  Recent Results (from the past 240 hour(s))  Culture, blood (Routine x 2)     Status: None (Preliminary result)   Collection Time: 04/16/2021  2:06 PM   Specimen: BLOOD  Result Value Ref Range Status   Specimen Description   Final    BLOOD LEFT ANTECUBITAL Performed at [facility], [place] [address], [place], [place] [address]    Special Requests   Final    BOTTLES DRAWN AEROBIC AND ANAEROBIC Blood Culture adequate volume   Culture   Final    NO GROWTH 2 DAYS Performed at [facility], [place] [address] [place], [place] [address]    Report Status PENDING  Incomplete  Culture, blood (Routine x 2)     Status: None (Preliminary result)   Collection Time: 04/06/2021  2:12 PM   Specimen: Site Not Specified; Blood  Result Value Ref Range Status   Specimen Description   Final    SITE NOT SPECIFIED Performed at [place] [address] [place], [place] [address]    Special Requests   Final    BOTTLES DRAWN AEROBIC AND ANAEROBIC Blood Culture adequate volume   Culture   Final    NO GROWTH 2 DAYS Performed at [facility], [place] [address] [place], [place] [address]    Report Status PENDING  Incomplete  Urine culture     Status: None   Collection Time: 04/11/2021  2:14 PM   Specimen: Urine, Random  Result Value Ref Range Status   Specimen Description   Final    URINE, RANDOM Performed at [place] [address] [place], [place] [address]    Special Requests   Final    NONE Performed at [facility], [place] [address], [place], [place] [address]    Culture   Final    NO GROWTH Performed at [facility], [place] [address], [place],  [address]    Report Status 03/28/2021 FINAL  Final  Resp Panel by RT-PCR (Flu A&B, Covid) Nasopharyngeal Swab     Status: None    Collection Time: 04/07/2021  3:15 PM   Specimen: Nasopharyngeal Swab; Nasopharyngeal(NP) swabs in vial transport medium  Result Value  Ref Range Status   SARS Coronavirus 2 by RT PCR NEGATIVE NEGATIVE Final    Comment: (NOTE) SARS-CoV-2 target nucleic acids are NOT DETECTED.  The SARS-CoV-2 RNA is generally detectable in upper respiratory specimens during the acute phase of infection. The lowest concentration of SARS-CoV-2 viral copies this assay can detect is 138 copies/mL. A negative result does not preclude SARS-Cov-2 infection and should not be used as the sole basis for treatment or other patient management decisions. A negative result may occur with  improper specimen collection/handling, submission of specimen other than nasopharyngeal swab, presence of viral mutation(s) within the areas targeted by this assay, and inadequate number of viral copies(<138 copies/mL). A negative result must be combined with clinical observations, patient history, and epidemiological information. The expected result is Negative.  Fact Sheet for Patients:  EntrepreneurPulse.com.au  Fact Sheet for Healthcare Providers:  IncredibleEmployment.be  This test is no t yet approved or cleared by the [place] FDA and  has been authorized for detection and/or diagnosis of SARS-CoV-2 by FDA under an Emergency Use Authorization (EUA). This EUA will remain  in effect (meaning this test can be used) for the duration of the COVID-19 declaration under Section 564(b)(1) of the Act, 21 U.S.C.section 360bbb-3(b)(1), unless the authorization is terminated  or revoked sooner.       Influenza A by PCR NEGATIVE NEGATIVE Final   Influenza B by PCR NEGATIVE NEGATIVE Final    Comment: (NOTE) The Xpert Xpress SARS-CoV-2/FLU/RSV plus assay is intended as an aid in the diagnosis of influenza from Nasopharyngeal swab specimens and should not be used as a sole basis for treatment.  Nasal washings and aspirates are unacceptable for Xpert Xpress SARS-CoV-2/FLU/RSV testing.  Fact Sheet for Patients: EntrepreneurPulse.com.au  Fact Sheet for Healthcare Providers: IncredibleEmployment.be  This test is not yet approved or cleared by the [place] FDA and has been authorized for detection and/or diagnosis of SARS-CoV-2 by FDA under an Emergency Use Authorization (EUA). This EUA will remain in effect (meaning this test can be used) for the duration of the COVID-19 declaration under Section 564(b)(1) of the Act, 21 U.S.C. section 360bbb-3(b)(1), unless the authorization is terminated or revoked.  Performed at [facility], [place] [address] [place], [place] [address]              Oswald Hillock   [facility] If 7PM-7AM, please contact night-coverage at [email or web address], Office  [phone]   03/29/2021, 12:51 PM  LOS: 2 days

## 2021-03-29 NOTE — Progress Notes (Signed)
  Speech Language Pathology Treatment: Dysphagia  Patient Details Name: Robert Gross MRN: [id number] DOB: 1929-09-14 Today's Date: 03/29/2021 Time: 9892-1194 SLP Time Calculation (min) (ACUTE ONLY): 15 min  Assessment / Plan / Recommendation Clinical Impression  ST follow up to assess for readiness for PO intake.  Patient roused easily to voice but had difficulty maintaining alertness level.  He was non verbal and unable to follow commands for the duration of this visit.  He presented with ongoing oral and pharyngeal dysphagia.  Decreased bolus awareness given ice chips was seen.  Patient observed to have no oral response given an ice chips.  He was accepting of a bolus of thin liquid via spoon with pharyngeal swallow triggered.  However, patient with immediate strong cough response.  Given pureed material patient noted to orally manipulate material, however, it was slow.  Pharyngeal swallow was triggered without overt s/s of aspiration.  Given his current presentation and mentation suggest he remain NPO including medications pending improvement in his current status.  ST will continue to follow for PO readiness.   HPI HPI: Robert Gross is a 87 y.o. male with medical history significant for communicating hydrocephalus s/p VP shunt, hypertension and type 2 diabetes who presents with family for concerns of altered mental status.  Patient was unable to provide HPI due to altered mental status.  Wife was at bedside but also unable to provide a detailed history.  The daughter reported that about 6 weeks ago he was doing well and even went out to eat with the family.  Then sometime in March he was diagnosed with an ear infection and was started on antibiotics and 2 weeks of prednisone. During the steroid course he had hypentension and hyperglycemia up to 400s. Once the prednisone was stopped patient began to have a sharp decline in his health and had progressive weakness.   A CT head follow up for his shunt  done on 4/5 showed a new subacute infarct that family was unaware of. Then last week he was also diagnosed with UTI with urine culture growing yeast and was placed on fluconazole which he is currently still taking.  Last Thursday he also saw neurosurgery and had his VP shunt readjusted. Starting Friday he could no longer get out of bed and had difficulty eating. Family had to give him a more pureed diet. Patient was diagnosed with acute metabolic encephalopathy, UTI, AKI, hypernatremia, and hypercalcemia.  Most recent chest xray was showing no active cardiopulmonary disease.  Most recent head CT was showing mildly dilated lateral ventricles slightly greater on the left, minimally increased from previous exam, atrophy with small vessel chornic ischemic changes of deep cerebral white mater with no additional intracranial abnormalities.      SLP Plan  Continue with current plan of care       Recommendations  Diet recommendations: NPO Medication Administration: Via alternative means                Oral Care Recommendations: Oral care QID Follow up Recommendations: Other (comment) (TBD) SLP Visit Diagnosis: Dysphagia, oropharyngeal phase (R13.12) Plan: Continue with current plan of care       GO                Lamar Sprinkles 03/29/2021, 8:49 AM

## 2021-03-29 NOTE — Progress Notes (Signed)
Pt's BP elevated, paged on call MD, awaiting any orders

## 2021-03-29 NOTE — Progress Notes (Addendum)
Received pt from [facility], alert abut nonverbal, oriented to the room, call light in reach, no complications noted at this moment, MD orders implemented, paged admissions regarding pt's arrival to the unit

## 2021-03-29 NOTE — Plan of Care (Signed)
Not progressing due to ongoing altered mental status and confusion.   Problem: Education: Goal: Knowledge of General Education information will improve Description: Including pain rating scale, medication(s)/side effects and non-pharmacologic comfort measures Outcome: Not Progressing   Problem: Health Behavior/Discharge Planning: Goal: Ability to manage health-related needs will improve Outcome: Not Progressing   Problem: Activity: Goal: Risk for activity intolerance will decrease Outcome: Not Progressing   Problem: Nutrition: Goal: Adequate nutrition will be maintained Outcome: Not Progressing   Problem: Elimination: Goal: Will not experience complications related to bowel motility Outcome: Not Progressing Goal: Will not experience complications related to urinary retention Outcome: Not Progressing   Problem: Pain Managment: Goal: General experience of comfort will improve Outcome: Not Progressing   Problem: Safety: Goal: Ability to remain free from injury will improve Outcome: Not Progressing   Problem: Skin Integrity: Goal: Risk for impaired skin integrity will decrease Outcome: Not Progressing

## 2021-03-29 NOTE — Progress Notes (Signed)
Patient ID: Robert Gross, male   DOB: [birth year], 87 y.o.   MRN: [id number] BP (!) 167/73 (BP Location: Right Arm)   Pulse 73   Temp (!) 97.5 F (36.4 C) (Oral)   Resp 17   Ht 5\' 8"  (1.727 m)   Wt 69.3 kg   SpO2 97%   BMI 23.23 kg/m  Called regarding the safety of performing a lumbar puncture. I have informed the service that a lumbar puncture is quite safe in the case of communicating hydrocephalus. That indeed it is used as a diagnostic tool when evaluation nph. Thus with a vp shunt in place there is dual protection, and no real risk of downward herniation, as there is no mass.

## 2021-03-29 NOTE — Consult Note (Signed)
Neurology Consultation  Reason for Consult: Acute encephalopathy  Referring Physician: Dr. Darrick Meigs  CC: Acute encephalopathy  History is obtained from: Chart review, Patient's daughter and wife at bedside  HPI: Robert Gross is a 87 y.o. male with a medical history significant for communicating hydrocephalus s/p VP shunt, family reported dementia, CVA, hypertension, hyperlipidemia, type 2 diabetes mellitus, memory loss, and benign prostatic hyperplasia who presented to [facility] on 03/29/2021 for evaluation of approximately 6 weeks of progressive confusion, weakness, and poor oral intake. Over the past 6 weeks prior to hospitalization, [name] had his VP shunt pressure modified on Mar 24, 2021 and he has been initiated on antibiotic therapy for treatment of an ear infection as well as a urinary tract infection. Initial work up revealed a septic patient with tachycardia, tachypnea, leukocytosis, with lactic acidosis but he has remained afebrile. He was also found to be hypernatremic, hypercalcemic, and dehydrated. Patient has also complained of neck stiffness over the last 2 weeks at home. Due to encephalopathy and concern for CNS infection, he was started on empiric meningitis coverage and transferred to [facility] for further evaluation and work up. He was not started on acyclovir due to low suspicion for herpes encephalitis.    On chart review, in February 2022 after sustaining a fall, CT head imaging revealed a small chronic right subdural hematoma and a subacute-to-chronic left subdural hematoma with a possible superimposed acute hemorrhage. Repeat CT head imaging in April 2022 revealed a left basal ganglia subacute infarct. Current CT head imaging on 04/10/2021 reveals mildly dilated lateral ventricles from previous imaging.   Per patient's daughter at bedside, patient's decline was first noticed in March after stopping prednisone when he was placed on a 12 day dose with Cefdinir for his ear infection. She  states that after he stopped taking the prednisone, he stopped talking and slept most of the day. She states that he still had adequate intake. She states that prior to his decline 6 weeks ago he was walking, talking, able to tell who his family members were and communicate with them, ordering his own meals at restaurants, and even walking without his cane at times. She states that at baseline he is not fully oriented and is unable to tell you month or year but he is able to recognize his family and communicate effectively. Over the past 2 weeks, [name] has become progressively more lethargic without verbalization and has started wincing with any manipulation of his neck. She administered a home urine dipstick test that revealed that he had a UTI which also causes him to be altered. She initiated him on some Cipro that he had at home but he has persistently remained encephalopathic prompting them to seek further care.  ROS: Unable to obtain due to altered mental status.   Past Medical History:  Diagnosis Date  . Arthritis   . BPH (benign prostatic hyperplasia)   . BPH with urinary obstruction   . Cataracts, bilateral   . Dizzy spells    residual from concussion 09-05-2017 intermittantly when turns head but stated as of 09-26-2017 no issues for past 2-3 days  . Essential hypertension   . Gait abnormality   . GERD (gastroesophageal reflux disease)   . History of concussion    09-05-2017  w/ loc --- per pt residual intermittant dizziness when he turns his head either way  . History of prostatitis   . History of sepsis    09-12-2017  urosepsis  . History of  urinary retention   . Hydrocephalus ([place])   . Hypercalcemia   . Hyperlipemia   . Memory loss   . Metabolic encephalopathy   . Osteopenia   . Squamous cell carcinoma in situ    multiple spots  . Type 2 diabetes mellitus ([place])   . Urinary retention    requires intermittent caths  . Vitamin D deficiency   . Vocal cord paralysis   .  Wears partial dentures    upper   Past Surgical History:  Procedure Laterality Date  . CATARACT EXTRACTION W/ INTRAOCULAR LENS  IMPLANT, BILATERAL  03/2017  . EXICISION EPIDERMAL CYST LEFT THUMB  09-12-2001   dr sypher  [facility]  . INGUINAL HERNIA REPAIR Right 11-07-2007   dr Dalbert [facility]  . KNEE ARTHROSCOPY Right 1990s  . MIDDLE EAR SURGERY     cyst removal  . SQUAMOUS CELL CARCINOMA EXCISION    . TRANSURETHRAL RESECTION OF PROSTATE  07-26-2000  dr Amalia [facility]  . TRANSURETHRAL RESECTION OF PROSTATE N/A 09/28/2017   Procedure: BIPOLAR TRANSURETHRAL RESECTION OF THE PROSTATE (TURP);  Surgeon: Lucas Mallow, MD;  Location: [facility];  Service: Urology;  Laterality: N/A;  . VENTRICULOPERITONEAL SHUNT Right 04/08/2020   Procedure: Shunt Placment - right occipital;  Surgeon: Earnie Larsson, MD;  Location: [place];  Service: Neurosurgery;  Laterality: Right;   Family History  Problem Relation Age of Onset  . Pancreatic cancer Mother   . Melanoma Sister   . Lung cancer Brother   . Cancer Father        unsure of type   Social History:   reports that he quit smoking about 53 years ago. His smoking use included cigarettes. He quit after 1.00 year of use. He has never used smokeless tobacco. He reports previous alcohol use. He reports that he does not use drugs.  Medications  Current Facility-Administered Medications:  .  ampicillin (OMNIPEN) 2 g in sodium chloride 0.9 % 100 mL IVPB, 2 g, Intravenous, Q6H, Lavina Hamman, MD, Last Rate: 300 mL/hr at 03/29/21 0519, 2 g at 03/29/21 0519 .  cefTRIAXone (ROCEPHIN) 2 g in sodium chloride 0.9 % 100 mL IVPB, 2 g, Intravenous, Q12H, Lavina Hamman, MD, Stopped at 03/28/21 2211 .  Chlorhexidine Gluconate Cloth 2 % PADS 6 each, 6 each, Topical, Daily, Lavina Hamman, MD, 6 each at 03/29/21 0520 .  cyanocobalamin ((VITAMIN B-12)) injection 1,000 mcg, 1,000 mcg, Subcutaneous, Daily, Berle Mull M, MD .  dextrose 5 % in lactated ringers  infusion, , Intravenous, Continuous, Lavina Hamman, MD, Last Rate: 100 mL/hr at 03/29/21 0233, Infusion Verify at 03/29/21 0233 .  fluconazole (DIFLUCAN) IVPB 100 mg, 100 mg, Intravenous, Q24H, Lavina Hamman, MD, Last Rate: 50 mL/hr at 03/28/21 1353, 100 mg at 03/28/21 1353 .  thiamine (B-1) injection 100 mg, 100 mg, Intravenous, Daily, Lavina Hamman, MD, 100 mg at 03/28/21 2143 .  vancomycin (VANCOREADY) IVPB 500 mg/100 mL, 500 mg, Intravenous, Q12H, Minda Ditto, RPH, Last Rate: 100 mL/hr at 03/29/21 0327, 500 mg at 03/29/21 0327  Exam: Current vital signs: BP (!) 155/91 (BP Location: Right Arm)   Pulse 76   Temp (!) 97.5 F (36.4 C) (Oral)   Resp 16   Ht 5\' 8"  (1.727 m)   Wt 69.3 kg   SpO2 99%   BMI 23.23 kg/m  Vital signs in last 24 hours: Temp:  [95.9 F (35.5 C)-97.8 F (36.6 C)] 97.5 F (36.4 C) (  05/08 0740) Pulse Rate:  [64-83] 76 (05/08 0740) Resp:  [16-20] 16 (05/08 0740) BP: (134-168)/(70-106) 155/91 (05/08 0740) SpO2:  [97 %-99 %] 99 % (05/08 0740) Weight:  [69 kg-69.3 kg] 69.3 kg (05/08 0400)  GENERAL: Frail appearing man, sleeping in bed, in no acute distress Head: Normocephalic and atraumatic EENT: Normal conjunctivae, arcus senilis bilaterally, dry mm, no OP obstruction Neck: Nuchal rigidity and loss of ROM present  LUNGS: Normal respiratory effort, non-labored breathing CV: Regular rate on tele, extremities without edema ABDOMEN: Soft, non-distended Ext: + Kernig's and Brudzinski's signs per initial NP evaluation. On repeat evaluation by Neurology attending he has increased tone in all 4 limbs but no definite Kernig or Brudzinski sing.     NEURO:  Mental Status: Lethargic, wakes to tactile stimulation. Does not verbalize on assessment. Does not follow commands.  Unable to determine orientation status due to patient condition.  Speech/Language: speech is mute.  Cranial Nerves:  II: PERRL 3 mm/brisk. Does not attend to stimuli in peripheral visual  fields. Unable to assess oculocephalic reflex due to nuchal rigidity. [name], IV, VI: Does not fixate or track examiner.  V: Unable to assess facial sensation due to patient condition VII: Face is symmetric resting and grimacing  VIII: Unable to assess due to patient condition IX, X: Patient does not verbalize or participate with examiner for palate evaluation.  XI: Head appears midline, patient does not shrug shoulders XII: Tongue protrudes midline without fasciculations.   Motor: Localizes with right upper extremity to noxious stimuli, minimal withdrawal of left upper extremity with noxious stimuli. Bilateral lower extremities with minimal withdrawal to noxious stimuli. Cogwheel rigidity noted in bilateral upper extremities. Right lower extremity with increased tone, left lower extremity with normal tone. Bulk is decreased. Sensation: Grimaces with light application of noxious stimuli in bilateral upper and lower extremities.  Coordination: Unable to assess due to patient's condition Plantars: Toes upgoing bilaterally Gait: Unable to assess  Labs I have reviewed labs in epic and the results pertinent to this consultation are: CBC    Component Value Date/Time   WBC 11.7 (H) 03/29/2021 0536   RBC 4.05 (L) 03/29/2021 0536   HGB 13.3 03/29/2021 0536   HCT 39.6 03/29/2021 0536   PLT 134 (L) 03/29/2021 0536   MCV 97.8 03/29/2021 0536   MCH 32.8 03/29/2021 0536   MCHC 33.6 03/29/2021 0536   RDW 13.2 03/29/2021 0536   LYMPHSABS 1.2 03/29/2021 0536   MONOABS 0.7 03/29/2021 0536   EOSABS 1.2 (H) 03/29/2021 0536   BASOSABS 0.1 03/29/2021 0536   CMP     Component Value Date/Time   NA 145 03/29/2021 0633   K 4.0 03/29/2021 0633   CL 116 (H) 03/29/2021 0633   CO2 25 03/29/2021 0633   GLUCOSE 153 (H) 03/29/2021 0633   BUN 21 03/29/2021 0633   CREATININE 0.76 03/29/2021 0633   CALCIUM 9.8 03/29/2021 0633   PROT 4.5 (L) 03/29/2021 0633   PROT 6.1 03/06/2020 1026   ALBUMIN 2.0 (L)  03/29/2021 0633   AST 14 (L) 03/29/2021 0633   ALT 13 03/29/2021 0633   ALKPHOS 97 03/29/2021 0633   BILITOT 0.3 03/29/2021 0633   GFRNONAA >60 03/29/2021 0633   GFRAA >60 06/16/2020 0324   Lipid Panel  No results found for: CHOL, TRIG, HDL, CHOLHDL, VLDL, LDLCALC, LDLDIRECT Lab Results  Component Value Date   HGBA1C 5.6 06/12/2020   Lab Results  Component Value Date   VITAMINB12 356 03/28/2021   Results for  orders placed or performed during the hospital encounter of 04/21/2021  Culture, blood (Routine x 2)     Status: None (Preliminary result)   Collection Time: 03/22/2021  2:06 PM   Specimen: BLOOD  Result Value Ref Range Status   Specimen Description   Final    BLOOD LEFT ANTECUBITAL Performed at [facility] [address] [place], [place] [address]    Special Requests   Final    BOTTLES DRAWN AEROBIC AND ANAEROBIC Blood Culture adequate volume   Culture   Final    NO GROWTH < 24 HOURS Performed at [facility], [place] [address], [place], [place] [address]    Report Status PENDING  Incomplete  Culture, blood (Routine x 2)     Status: None (Preliminary result)   Collection Time: 04/08/2021  2:12 PM   Specimen: Site Not Specified; Blood  Result Value Ref Range Status   Specimen Description   Final    SITE NOT SPECIFIED Performed at [facility] [address], [place], [place] [address]    Special Requests   Final    BOTTLES DRAWN AEROBIC AND ANAEROBIC Blood Culture adequate volume   Culture   Final    NO GROWTH < 24 HOURS Performed at [facility], [place] [address] [place], [place] [address]    Report Status PENDING  Incomplete  Urine culture     Status: None   Collection Time: 04/06/2021  2:14 PM   Specimen: Urine, Random  Result Value Ref Range Status   Specimen Description   Final    URINE, RANDOM Performed at [facility] [address] [place], [place] [address]    Special Requests   Final     NONE Performed at [facility], [place] [address] [place], [place] [address]    Culture   Final    NO GROWTH Performed at [facility], [place] [address] [place], [place] [address]    Report Status 03/28/2021 FINAL  Final  Resp Panel by RT-PCR (Flu A&B, Covid) Nasopharyngeal Swab     Status: None   Collection Time: 04/12/2021  3:15 PM   Specimen: Nasopharyngeal Swab; Nasopharyngeal(NP) swabs in vial transport medium  Result Value Ref Range Status   SARS Coronavirus 2 by RT PCR NEGATIVE NEGATIVE Final    Comment: (NOTE) SARS-CoV-2 target nucleic acids are NOT DETECTED.  The SARS-CoV-2 RNA is generally detectable in upper respiratory specimens during the acute phase of infection. The lowest concentration of SARS-CoV-2 viral copies this assay can detect is 138 copies/mL. A negative result does not preclude SARS-Cov-2 infection and should not be used as the sole basis for treatment or other patient management decisions. A negative result may occur with  improper specimen collection/handling, submission of specimen other than nasopharyngeal swab, presence of viral mutation(s) within the areas targeted by this assay, and inadequate number of viral copies(<138 copies/mL). A negative result must be combined with clinical observations, patient history, and epidemiological information. The expected result is Negative.  Fact Sheet for Patients:  EntrepreneurPulse.com.au  Fact Sheet for Healthcare Providers:  IncredibleEmployment.be  This test is no t yet approved or cleared by the [place] FDA and  has been authorized for detection and/or diagnosis of SARS-CoV-2 by FDA under an Emergency Use Authorization (EUA). This EUA will remain  in effect (meaning this test can be used) for the duration of the COVID-19 declaration under Section 564(b)(1) of the Act,  21 U.S.C.section 360bbb-3(b)(1), unless the authorization is terminated   or revoked sooner.       Influenza A by PCR NEGATIVE NEGATIVE Final   Influenza B by PCR NEGATIVE NEGATIVE Final    Comment: (NOTE) The Xpert Xpress SARS-CoV-2/FLU/RSV plus assay is intended as an aid in the diagnosis of influenza from Nasopharyngeal swab specimens and should not be used as a sole basis for treatment. Nasal washings and aspirates are unacceptable for Xpert Xpress SARS-CoV-2/FLU/RSV testing.  Fact Sheet for Patients: EntrepreneurPulse.com.au  Fact Sheet for Healthcare Providers: IncredibleEmployment.be  This test is not yet approved or cleared by the [place] FDA and has been authorized for detection and/or diagnosis of SARS-CoV-2 by FDA under an Emergency Use Authorization (EUA). This EUA will remain in effect (meaning this test can be used) for the duration of the COVID-19 declaration under Section 564(b)(1) of the Act, 21 U.S.C. section 360bbb-3(b)(1), unless the authorization is terminated or revoked.  Performed at [facility], [place] [address], [place], [place] [address]    Lab Results  Component Value Date   TSH 4.637 (H) 03/28/2021  Free T4: 0.97  Lactic Acid, Venous    Component Value Date/Time   LATICACIDVEN 2.1 (HH) 03/28/2021 0349   Urinalysis    Component Value Date/Time   COLORURINE YELLOW 04/05/2021 1403   APPEARANCEUR HAZY (A) 04/11/2021 1403   LABSPEC 1.020 04/15/2021 1403   PHURINE 5.0 04/08/2021 1403   GLUCOSEU 150 (A) 04/15/2021 1403   HGBUR LARGE (A) 04/14/2021 1403   BILIRUBINUR NEGATIVE 04/05/2021 1403   KETONESUR 5 (A) 03/23/2021 1403   PROTEINUR 30 (A) 04/20/2021 1403   UROBILINOGEN 0.2 11/03/2007 2123   NITRITE NEGATIVE 04/09/2021 1403   LEUKOCYTESUR LARGE (A) 04/14/2021 1403   Imaging I have reviewed the images obtained:  CT-scan of the brain 5/6: Mildly dilated lateral ventricles slightly greater on LEFT, minimally increased from previous exam.  Atrophy  with small vessel chronic ischemic changes of deep cerebral white matter. No additional intracranial abnormalities.  EEG: This study is suggestive of moderate diffuse encephalopathy, nonspecific etiology. No seizures or epileptiform discharges were seen throughout the recording.  Assessment: 88 year old male who presented to [facility] 5/6 for evaluation of progressive confusion, weakness, and poor oral intake. The decline in mental status started approximately 6 weeks ago with a more rapid decline over the past 2 weeks with patient becoming nonverbal and increasingly lethargic. Over the past 6 weeks Mr. Dolby has been treated for an ear infection with Cefidinir and a 12 day dose of prednisone, a UTI with Cipro, and had his shunt manipulated.  - Examination reveals nonverbal patient, unable to follow commands with nuchal rigidity and an initial leukocytosis of 13.5 and in septic shock on arrival. Kernig's and Brudzinski's signs are positive. Patient has remained afebrile during hospitalization.  - CT head with moderately dilated lateral ventricles, slightly greater on the left, minimally increased from previous exam. - EEG suggestive of moderate diffuse encephalopathy but without seizures or epileptiform discharges throughout the recording.  - Due to interval ventricular dilation of the lateral ventricles on CT, elevated CSF pressure is likely contributing to the patient's presentation. Infection is also in the DDx given nuchal rigidity and the other meningeal signs documented in the exam above. The patient will need evaluation of his shunt by Neurosurgery to assess for elevated intracranial pressure due to shunt failure as well as to obtain CSF sample for a gram stain, culture, protein, glucose, cell count, and cryptococcal antigen. Lumbar  puncture is contraindicated due to interventricular dilation of the lateral ventricles consistent with elevated CSF pressure which would be associated with a high risk for  transtentorial herniation if CSF is accessed below the level of the aqueductus Silvius. Additionally, patient with significant discomfort with any manipulation or positioning, I do not believe that he will be able to tolerate a bedside LP. The above has been discussed with Dr. Darrick Meigs with the hospitalist service. - Although this is a communicating hydrocephalus, it is possible for rapid pressure change from CSF drainage below the aqueduct of Sylvius to nevertheless result in herniation if CSF flow via the aqueduct, which is about 1 mm in diameter is not fast enough to compensate for the CSF removal below the aqueduct, which could result in a significant pressure gradient.  - A component of his neck stiffness appears likely to be due to torticollis. Head is turned to the patient's left and his right SCM muscle has increased tone consistent with contraction. He points to his right SCM and appears in some discomfort when it is palpated. He may benefit from a muscle relaxer such as Flexeril, but I worry that this will cause sedation or altered sensorium in this fragile patient.   Impression: Acute encephalopathy Nuchal rigidity Possible torticollis Enlarging ventricles in the setting of VP shunt  Recommendations: - Appreciate Neurosurgery evaluation of VP shunt - CSF labs: gram stain, culture, protein, glucose, cell count, and cryptococcal antigen - Continue empiric meningitis coverage - Neuro checks - MRI brain to assess for possible transependymal CSF flow - ID consult may be helpful  - Neurology will continue to follow  Anibal Henderson, AGAC-NP [facility] Pager: [phone]  I have seen and examined the patient. I have formulated the assessment and recommendations. 87 year old male with progressively worsened cognition and motor function in addition to neck pain for several weeks. The pattern of symptom evolution is more consistent with effects of elevated CSF pressure than  meningitis and CT shows interval significant widening of the lateral ventricles relative to the prior scan obtained 13 days ago. The radiology report describes this as minimal interval change, but on side by side comparison of the images by Neurology, the change appears significant. The patient is on empiric meningitis-dose antibiotics in the event that he also has a superimposed meningitis. Recommendations as above.  Electronically signed: Dr. Kerney Elbe

## 2021-03-29 NOTE — Progress Notes (Signed)
[facility] to take pt to [facility].  Wife called and made aware.

## 2021-03-29 NOTE — Procedures (Signed)
Patient Name: Robert Gross  MRN: [id number]  Epilepsy Attending: Lora Havens  Referring Physician/Provider: Dr Berle Mull Date: 03/29/2021 Duration: 26.41 mins  Patient history: 87yo M with ams. EEG to evaluate for seizure.  Level of alertness: Awake, asleep  AEDs during EEG study: None  Technical aspects: This EEG study was done with scalp electrodes positioned according to the 10-20 International system of electrode placement. Electrical activity was acquired at a sampling rate of 500Hz  and reviewed with a high frequency filter of 70Hz  and a low frequency filter of 1Hz . EEG data were recorded continuously and digitally stored.   Description: No posterior dominant rhythm was seen. Sleep was characterized by sleep spindles (12 to 14 Hz), maximal frontocentral region. EEG showed continuous generalized 3 to 6 Hz theta-delta slowing. Hyperventilation and photic stimulation were not performed.     ABNORMALITY - Continuous slow, generalized  IMPRESSION: This study is suggestive of moderate diffuse encephalopathy, nonspecific etiology. No seizures or epileptiform discharges were seen throughout the recording.   Riyanna Crutchley Barbra Sarks

## 2021-03-29 NOTE — Progress Notes (Signed)
Wife called and informed that pt will be going to [facility] room 13[facility].  No other questions asked at this time.

## 2021-03-30 ENCOUNTER — Inpatient Hospital Stay (HOSPITAL_COMMUNITY): Payer: Medicare PPO

## 2021-03-30 DIAGNOSIS — R1310 Dysphagia, unspecified: Secondary | ICD-10-CM

## 2021-03-30 DIAGNOSIS — G9341 Metabolic encephalopathy: Secondary | ICD-10-CM | POA: Diagnosis not present

## 2021-03-30 LAB — BASIC METABOLIC PANEL
Anion gap: 6 (ref 5–15)
BUN: 13 mg/dL (ref 8–23)
CO2: 24 mmol/L (ref 22–32)
Calcium: 9.6 mg/dL (ref 8.9–10.3)
Chloride: 112 mmol/L — ABNORMAL HIGH (ref 98–111)
Creatinine, Ser: 0.62 mg/dL (ref 0.61–1.24)
GFR, Estimated: >60 mL/min (ref >60)
Glucose, Bld: 152 mg/dL — ABNORMAL HIGH (ref 70–99)
Potassium: 3 mmol/L — ABNORMAL LOW (ref 3.5–5.1)
Sodium: 142 mmol/L (ref 135–145)

## 2021-03-30 LAB — PROTEIN AND GLUCOSE, CSF
Glucose, CSF: 101 mg/dL — ABNORMAL HIGH (ref 40–70)
Total  Protein, CSF: 88 mg/dL — ABNORMAL HIGH (ref 15–45)

## 2021-03-30 LAB — CBC
HCT: 40.4 % (ref 39.0–52.0)
Hemoglobin: 13.6 g/dL (ref 13.0–17.0)
MCH: 32.5 pg (ref 26.0–34.0)
MCHC: 33.7 g/dL (ref 30.0–36.0)
MCV: 96.4 fL (ref 80.0–100.0)
Platelets: 167 10*3/uL (ref 150–400)
RBC: 4.19 MIL/uL — ABNORMAL LOW (ref 4.22–5.81)
RDW: 13.2 % (ref 11.5–15.5)
WBC: 9.6 10*3/uL (ref 4.0–10.5)
nRBC: 0 % (ref 0.0–0.2)

## 2021-03-30 LAB — CSF CELL COUNT WITH DIFFERENTIAL
RBC Count, CSF: 280 /mm3 — ABNORMAL HIGH
Tube #: 1
WBC, CSF: 1 /mm3 (ref 0–5)

## 2021-03-30 LAB — GLUCOSE, CAPILLARY
Glucose-Capillary: 143 mg/dL — ABNORMAL HIGH (ref 70–99)
Glucose-Capillary: 145 mg/dL — ABNORMAL HIGH (ref 70–99)

## 2021-03-30 LAB — CRYPTOCOCCAL ANTIGEN, CSF: Crypto Ag: NEGATIVE

## 2021-03-30 LAB — HEMOGLOBIN A1C
Hgb A1c MFr Bld: 9.3 % — ABNORMAL HIGH (ref 4.8–5.6)
Mean Plasma Glucose: 220.21 mg/dL

## 2021-03-30 MED ORDER — LIDOCAINE HCL 1 % IJ SOLN
5.0000 mL | Freq: Once | INTRAMUSCULAR | Status: DC
  Filled 2021-03-30: qty 5

## 2021-03-30 MED ORDER — LIDOCAINE HCL (PF) 1 % IJ SOLN: 5.0000 mL | Freq: Once | INTRAMUSCULAR | Status: DC

## 2021-03-30 MED ORDER — INSULIN ASPART 100 UNIT/ML IJ SOLN
0.0000 [IU] | INTRAMUSCULAR | Status: DC
  Administered 2021-03-31 (×3): 1 [IU] via SUBCUTANEOUS
  Administered 2021-03-31: 2 [IU] via SUBCUTANEOUS
  Administered 2021-03-31 – 2021-04-01 (×2): 1 [IU] via SUBCUTANEOUS
  Administered 2021-04-01: 2 [IU] via SUBCUTANEOUS
  Administered 2021-04-01: 1 [IU] via SUBCUTANEOUS
  Administered 2021-04-01: 3 [IU] via SUBCUTANEOUS
  Administered 2021-04-01: 2 [IU] via SUBCUTANEOUS
  Administered 2021-04-01: 3 [IU] via SUBCUTANEOUS
  Administered 2021-04-02 (×5): 1 [IU] via SUBCUTANEOUS
  Administered 2021-04-02 – 2021-04-03 (×2): 2 [IU] via SUBCUTANEOUS
  Administered 2021-04-03: 1 [IU] via SUBCUTANEOUS
  Administered 2021-04-03: 3 [IU] via SUBCUTANEOUS
  Administered 2021-04-03 (×2): 1 [IU] via SUBCUTANEOUS
  Administered 2021-04-04 (×2): 2 [IU] via SUBCUTANEOUS
  Administered 2021-04-04 (×3): 1 [IU] via SUBCUTANEOUS
  Administered 2021-04-05: 2 [IU] via SUBCUTANEOUS
  Administered 2021-04-05: 1 [IU] via SUBCUTANEOUS
  Administered 2021-04-05: 3 [IU] via SUBCUTANEOUS
  Administered 2021-04-05: 2 [IU] via SUBCUTANEOUS
  Administered 2021-04-05: 1 [IU] via SUBCUTANEOUS
  Administered 2021-04-05: 3 [IU] via SUBCUTANEOUS
  Administered 2021-04-05: 2 [IU] via SUBCUTANEOUS
  Administered 2021-04-06: 3 [IU] via SUBCUTANEOUS
  Administered 2021-04-06: 2 [IU] via SUBCUTANEOUS
  Administered 2021-04-06: 3 [IU] via SUBCUTANEOUS
  Administered 2021-04-06: 2 [IU] via SUBCUTANEOUS
  Administered 2021-04-06: 4 [IU] via SUBCUTANEOUS
  Administered 2021-04-07: 2 [IU] via SUBCUTANEOUS
  Administered 2021-04-07: 3 [IU] via SUBCUTANEOUS
  Administered 2021-04-07: 2 [IU] via SUBCUTANEOUS

## 2021-03-30 MED ORDER — LIDOCAINE HCL (PF) 1 % IJ SOLN
INTRAMUSCULAR | Status: AC
  Filled 2021-03-30: qty 5

## 2021-03-30 MED ORDER — POTASSIUM CHLORIDE 10 MEQ/100ML IV SOLN
10.0000 meq | INTRAVENOUS | Status: AC
  Administered 2021-03-30 – 2021-03-31 (×6): 10 meq via INTRAVENOUS
  Filled 2021-03-30 (×5): qty 100

## 2021-03-30 NOTE — Progress Notes (Signed)
PROGRESS NOTE   Robert Gross  [id number]    DOB: 08-20-1929    DOA: 03/24/2021  PCP: [name], MD   I have briefly reviewed patients previous medical records in [facility].  Chief Complaint  Patient presents with  . Altered Mental Status    Brief Narrative:  87 year old male with medical history significant for but not limited to communicating hydrocephalus s/p VP shunt, family reported dementia, hypertension, DM2, BPH, GERD, recent CVA, hyperlipidemia, presented with worsening confusion, weakness and poor oral intake.  He was reportedly doing well about 6 weeks ago, then sometime in March she was diagnosed with an ear infection and started on antibiotics and 2 weeks of prednisone.  During the steroid course, he had hypertension and hyperglycemia up to the 200s.  Once the prednisone was stopped, there was a sharp decline in his health and progressive weakness.  CT head for shunt follow-up 4/5 showed new subacute infarct.  Week prior to admission, he was also diagnosed with yeast UTI and started on fluconazole.  Seen by neurosurgery and VP shunt readjusted on 03/24/2021.  He progressively worsened, unable to get out of bed, difficulty eating.  Admitted initially for acute metabolic encephalopathy, hypernatremia, hypercalcemia, dehydration, suspected UTI.  He had complained of neck stiffness for 2 weeks at home, due to concern for CNS infection, empiric meningitis coverage was started and patient was transferred [facility] to [facility] for further evaluation and management.  No acyclovir started due to low suspicion for herpes encephalitis.  Neurology and neurosurgery were consulted.  Patient underwent LP 5/9.  Ongoing AMS with unsafe oral intake.  Family opted for [facility] tube placement and tube feeds for now.   Assessment & Plan:  Principal Problem:   Acute metabolic encephalopathy Active Problems:   Essential hypertension   Diabetes mellitus without complication (HCC)    UTI (urinary tract infection)   Communicating hydrocephalus (HCC)   Hypercalcemia   Hypernatremia   History of CVA (cerebrovascular accident)   Acute metabolic encephalopathy Decline in mental status started approximately 6 weeks PTA with a more rapid decline over the past 2 weeks PTA when he became nonverbal and increasingly lethargic.  Over the past 6 weeks, he was treated for an ear infection with cefdinir and a 12-day course of prednisone, UTI with Cipro and fluconazole and his VP shunt was readjusted by neurosurgery.  CT head: Mildly dilated lateral ventricles slightly greater on the left, minimally increased from previous exam.  EEG: Suggestive of moderate diffuse encephalopathy, nonspecific etiology.  No seizures or epileptiform discharges seen throughout the recording.  Vitamin B12: 356.  Vitamin B1: Pending. TSH: 4.637, Free T4: 0.97.  Ammonia 28.  Urine culture: No growth.  Blood cultures x2: Negative to date.  CSF work-up pending.  Chest x-ray unremarkable.  As per neurosurgery, no obstructive hydrocephalus.  Remains on empiric meningitic coverage with IV ampicillin, ceftriaxone, and vancomycin.  Neurology following.  As per neurosurgery, left basal ganglia subacute hypodensity consistent with infarct more likely causing his encephalopathy. Does not appear to have significant improvement in mental status.  Defer to neurology regarding MRI brain.  Also on IV thiamine pending B1 results.  This is likely multifactorial.  Was on empiric IV fluconazole for yeast UTI PTA, urine culture now negative-discontinue fluconazole.  Neck rigidity/possible torticollis Neck turned towards the left.  Evaluation and management for possible meningitis as noted above.  History of communicating hydrocephalus s/p VP shunt Neurosurgery following.  As per neurosurgery, he developed  bilateral subdural fluid collections necessitating adjustment of his shunt valve pressure higher.  His subdural collections essentially  resolved but he continues to have mild ventriculomegaly.  Shunt appears to be operating well otherwise.  AKI, mild: Resolved.  Lisinopril on hold.  Follow BMP.  Dehydration with hypernatremia: Serum sodium 150 on admission, likely related to poor oral intake.  Resolved.  Hypercalcemia Noted to have serum calcium of 11.5.  Resolved.  Corrected serum calcium today is 10.9.  Hypokalemia/hypomagnesemia No labs for today, will repeat BMP stat to assess and replace as needed.  Hypomagnesemia replaced.  Dysphagia Unsafe oral intake due to AMS.  Speech therapy recommends NPO and alternate means of nutrition.  I had a long discussion with patient's daughter, advised alternative nutrition options, risks and benefits, including temporary Panda tube feeds versus PEG, continued IVF for few more days to see if mental status improves.  I do not think TPN is indicated.  Family have opted for Panda tube feeds, ordered Panda tube and dietitian consult for same.  Type II DM Monitor CBGs and SSI as needed.  Metformin on hold.  CVA Per CT head 03/16/2021: Stable appearance of hypodensity seen in the left anterior basal ganglia consistent with subacute infarction.  Defer to neurology regarding MRI brain and aspirin  Essential hypertension Mildly uncontrolled.  Hold lisinopril.  As needed IV hydralazine.  Thrombocytopenia: Unclear etiology.?  Related to meds.  Follow CBC.  Adult failure to thrive Likely multifactorial.  Monitor closely.  If no significant and sustained improvement, may need to consider palliative care consultation to address goals of care.  Body mass index is 23.23 kg/m.    DVT prophylaxis: Place and maintain sequential compression device Start: 03/29/21 0749     Code Status: DNR Family Communication: Discussed in detail with patient's daughter via phone, updated care and answered all questions Disposition:  Status is: Inpatient  Remains inpatient appropriate because:Inpatient  level of care appropriate due to severity of illness   Dispo: The patient is from: Home              Anticipated d/c is to: TBD              Patient currently is not medically stable to d/c.   Difficult to place patient No        Consultants:   Neurosurgery Neurology  Procedures:   LP by neurology 5/9.  Antimicrobials:    Anti-infectives (From admission, onward)   Start     Dose/Rate Route Frequency Ordered Stop   03/28/21 2200  cefTRIAXone (ROCEPHIN) 2 g in sodium chloride 0.9 % 100 mL IVPB        2 g 200 mL/hr over 30 Minutes Intravenous Every 12 hours 03/28/21 1244     03/28/21 1800  vancomycin (VANCOREADY) IVPB 1000 mg/200 mL  Status:  Discontinued        1,000 mg 200 mL/hr over 60 Minutes Intravenous Every 24 hours 03/28/21 0006 03/28/21 0757   03/28/21 1600  vancomycin (VANCOREADY) IVPB 500 mg/100 mL        500 mg 100 mL/hr over 60 Minutes Intravenous Every 12 hours 03/28/21 1457     03/28/21 1400  fluconazole (DIFLUCAN) IVPB 100 mg        100 mg 50 mL/hr over 60 Minutes Intravenous Every 24 hours 03/28/21 1239     03/28/21 1330  vancomycin (VANCOREADY) IVPB 1000 mg/200 mL  Status:  Discontinued        1,000 mg 200 mL/hr over  60 Minutes Intravenous  Once 03/28/21 1244 03/28/21 1259   03/28/21 1300  ampicillin (OMNIPEN) 2 g in sodium chloride 0.9 % 100 mL IVPB        2 g 300 mL/hr over 20 Minutes Intravenous Every 6 hours 03/28/21 1244     03/28/21 1000  fluconazole (DIFLUCAN) tablet 100 mg  Status:  Discontinued        100 mg Oral Daily 04/04/2021 2341 03/28/21 1239   03/28/21 0800  ceFEPIme (MAXIPIME) 2 g in sodium chloride 0.9 % 100 mL IVPB  Status:  Discontinued        2 g 200 mL/hr over 30 Minutes Intravenous Every 12 hours 03/28/21 0006 03/28/21 1244   03/28/21 0400  metroNIDAZOLE (FLAGYL) IVPB 500 mg  Status:  Discontinued        500 mg 100 mL/hr over 60 Minutes Intravenous Every 8 hours 04/12/2021 2346 03/28/21 0757   04/19/2021 1815  metroNIDAZOLE (FLAGYL)  IVPB 500 mg        500 mg 100 mL/hr over 60 Minutes Intravenous  Once 03/30/2021 1805 04/19/2021 2049   04/10/2021 1815  ceFEPIme (MAXIPIME) 2 g in sodium chloride 0.9 % 100 mL IVPB        2 g 200 mL/hr over 30 Minutes Intravenous  Once 03/26/2021 1808 04/06/2021 2049   04/17/2021 1800  vancomycin (VANCOCIN) IVPB 1000 mg/200 mL premix        1,000 mg 200 mL/hr over 60 Minutes Intravenous  Once 03/25/2021 1752 03/29/2021 1951   04/10/2021 1515  cefTRIAXone (ROCEPHIN) 1 g in sodium chloride 0.9 % 100 mL IVPB  Status:  Discontinued        1 g 200 mL/hr over 30 Minutes Intravenous Every 24 hours 04/16/2021 1513 03/30/2021 1808        Subjective:  Patient nonverbal.  Unable to get any history.  Objective:   Vitals:   03/30/21 0510 03/30/21 0803 03/30/21 1138 03/30/21 1528  BP: (!) 158/79 (!) 179/90 (!) 149/62 (!) 194/80  Pulse: 70 70 60 76  Resp:  14  16  Temp:  (!) 97.3 F (36.3 C)  97.7 F (36.5 C)  TempSrc:  Oral  Oral  SpO2:  97% 97% 98%  Weight:      Height:        General exam: Very elderly male, moderately built, frail, chronically ill looking lying supine in bed with head/neck turn to the left.  Neck: Stiff and turned to the left. Respiratory system: Clear to auscultation/poor inspiratory effort. Respiratory effort normal. Cardiovascular system: S1 & S2 heard, RRR. No JVD, murmurs, rubs, gallops or clicks. No pedal edema.  Telemetry personally reviewed: Sinus rhythm. Gastrointestinal system: Abdomen is nondistended, soft and nontender. No organomegaly or masses felt. Normal bowel sounds heard. Central nervous system: Eyes open, does not track.  Nonverbal.  Does not follow instructions. No focal neurological deficits. Extremities: Localizes to sternal rub with right upper extremity and withdraws bilateral lower extremities.  Minimal movement of left upper extremity. Skin: No rashes, lesions or ulcers Psychiatry: Judgement and insight impaired. Mood & affect cannot be assessed.     Data  Reviewed:   I have personally reviewed following labs and imaging studies   CBC: Recent Labs  Lab 04/07/2021 1406 03/28/21 0349 03/29/21 0536  WBC 14.7* 13.5* 11.7*  NEUTROABS 12.2*  --  8.3*  HGB 15.8 13.2 13.3  HCT 48.1 40.7 39.6  MCV 99.0 101.2* 97.8  PLT 184 143* 134*    Basic Metabolic  Panel: Recent Labs  Lab 03/28/21 1318 03/28/21 2102 03/29/21 0633 03/29/21 1231 03/29/21 2045  NA 143   < > 145 143 142  K 3.5   < > 4.0 3.5 3.0*  CL 118*   < > 116* 118* 115*  CO2 21*   < > 25 23 23   GLUCOSE 195*   < > 153* 153* 160*  BUN 32*   < > 21 18 16   CREATININE 0.78   < > 0.76 0.68 0.62  CALCIUM 9.7   < > 9.8 9.9 9.3  MG 1.5*  --  2.0  --   --    < > = values in this interval not displayed.    Liver Function Tests: Recent Labs  Lab 04/14/2021 1406 03/29/21 0633  AST 16 14*  ALT 14 13  ALKPHOS 134* 97  BILITOT 0.5 0.3  PROT 6.0* 4.5*  ALBUMIN 2.6* 2.0*    CBG: Recent Labs  Lab 03/29/21 0354  GLUCAP 151*    Microbiology Studies:   Recent Results (from the past 240 hour(s))  Culture, blood (Routine x 2)     Status: None (Preliminary result)   Collection Time: 03/25/2021  2:06 PM   Specimen: BLOOD  Result Value Ref Range Status   Specimen Description   Final    BLOOD LEFT ANTECUBITAL Performed at [facility], [place] [address], [place], [place] [address]    Special Requests   Final    BOTTLES DRAWN AEROBIC AND ANAEROBIC Blood Culture adequate volume   Culture   Final    NO GROWTH 3 DAYS Performed at [facility], [place] [address], [place], [place] [address]    Report Status PENDING  Incomplete  Culture, blood (Routine x 2)     Status: None (Preliminary result)   Collection Time: 04/08/2021  2:12 PM   Specimen: Site Not Specified; Blood  Result Value Ref Range Status   Specimen Description   Final    SITE NOT SPECIFIED Performed at [place] [address], [place], [place] [address]    Special Requests    Final    BOTTLES DRAWN AEROBIC AND ANAEROBIC Blood Culture adequate volume   Culture   Final    NO GROWTH 3 DAYS Performed at [facility], [place] [address] [place], [place] [address]    Report Status PENDING  Incomplete  Urine culture     Status: None   Collection Time: 04/04/2021  2:14 PM   Specimen: Urine, Random  Result Value Ref Range Status   Specimen Description   Final    URINE, RANDOM Performed at [place] [address] [place], [place] [address]    Special Requests   Final    NONE Performed at [facility], [place] [address], [place], [place] [address]    Culture   Final    NO GROWTH Performed at [facility], [place] [address] [place], [place] [address]    Report Status 03/28/2021 FINAL  Final  Resp Panel by RT-PCR (Flu A&B, Covid) Nasopharyngeal Swab     Status: None   Collection Time: 03/24/2021  3:15 PM   Specimen: Nasopharyngeal Swab; Nasopharyngeal(NP) swabs in vial transport medium  Result Value Ref Range Status   SARS Coronavirus 2 by RT PCR NEGATIVE NEGATIVE Final    Comment: (NOTE) SARS-CoV-2 target nucleic acids are NOT DETECTED.  The SARS-CoV-2 RNA is generally detectable in upper respiratory specimens during  the acute phase of infection. The lowest concentration of SARS-CoV-2 viral copies this assay can detect is 138 copies/mL. A negative result does not preclude SARS-Cov-2 infection and should not be used as the sole basis for treatment or other patient management decisions. A negative result may occur with  improper specimen collection/handling, submission of specimen other than nasopharyngeal swab, presence of viral mutation(s) within the areas targeted by this assay, and inadequate number of viral copies(<138 copies/mL). A negative result must be combined with clinical observations, patient history, and epidemiological information. The expected result is Negative.  Fact Sheet for Patients:   EntrepreneurPulse.com.au  Fact Sheet for Healthcare Providers:  IncredibleEmployment.be  This test is no t yet approved or cleared by the [place] FDA and  has been authorized for detection and/or diagnosis of SARS-CoV-2 by FDA under an Emergency Use Authorization (EUA). This EUA will remain  in effect (meaning this test can be used) for the duration of the COVID-19 declaration under Section 564(b)(1) of the Act, 21 U.S.C.section 360bbb-3(b)(1), unless the authorization is terminated  or revoked sooner.       Influenza A by PCR NEGATIVE NEGATIVE Final   Influenza B by PCR NEGATIVE NEGATIVE Final    Comment: (NOTE) The Xpert Xpress SARS-CoV-2/FLU/RSV plus assay is intended as an aid in the diagnosis of influenza from Nasopharyngeal swab specimens and should not be used as a sole basis for treatment. Nasal washings and aspirates are unacceptable for Xpert Xpress SARS-CoV-2/FLU/RSV testing.  Fact Sheet for Patients: EntrepreneurPulse.com.au  Fact Sheet for Healthcare Providers: IncredibleEmployment.be  This test is not yet approved or cleared by the [place] FDA and has been authorized for detection and/or diagnosis of SARS-CoV-2 by FDA under an Emergency Use Authorization (EUA). This EUA will remain in effect (meaning this test can be used) for the duration of the COVID-19 declaration under Section 564(b)(1) of the Act, 21 U.S.C. section 360bbb-3(b)(1), unless the authorization is terminated or revoked.  Performed at [facility], [place] [address] [place], [place] [address]   CSF culture w Stat Gram Stain     Status: None (Preliminary result)   Collection Time: 03/30/21  1:48 PM   Specimen: CSF; Cerebrospinal Fluid  Result Value Ref Range Status   Specimen Description CSF  Final   Special Requests NONE  Final   Gram Stain   Final    NO WBC SEEN NO ORGANISMS SEEN CYTOSPIN  SMEAR Performed at [facility], [place] [address] [place], [place] [address]    Culture PENDING  Incomplete   Report Status PENDING  Incomplete     Radiology Studies:  DG Swallowing Func-Speech Pathology  Result Date: 03/30/2021 Objective Swallowing Evaluation: Type of Study: MBS-Modified Barium Swallow Study  Patient Details Name: BRIDGER REDDIC MRN: [id number] Date of Birth: September 06, 1929 Today's Date: 03/30/2021 Time: SLP Start Time (ACUTE ONLY): 1226 -SLP Stop Time (ACUTE ONLY): 1250 SLP Time Calculation (min) (ACUTE ONLY): 24 min Past Medical History: Past Medical History: Diagnosis Date . Arthritis  . BPH (benign prostatic hyperplasia)  . BPH with urinary obstruction  . Cataracts, bilateral  . Dizzy spells   residual from concussion 09-05-2017 intermittantly when turns head but stated as of 09-26-2017 no issues for past 2-3 days . Essential hypertension  . Gait abnormality  . GERD (gastroesophageal reflux disease)  . History of concussion   09-05-2017  w/ loc --- per pt residual intermittant dizziness when he turns his head either way . History of prostatitis  .  History of sepsis   09-12-2017  urosepsis . History of urinary retention  . Hydrocephalus ([place])  . Hypercalcemia  . Hyperlipemia  . Memory loss  . Metabolic encephalopathy  . Osteopenia  . Squamous cell carcinoma in situ   multiple spots . Type 2 diabetes mellitus ([place])  . Urinary retention   requires intermittent caths . Vitamin D deficiency  . Vocal cord paralysis  . Wears partial dentures   upper Past Surgical History: Past Surgical History: Procedure Laterality Date . CATARACT EXTRACTION W/ INTRAOCULAR LENS  IMPLANT, BILATERAL  03/2017 . EXICISION EPIDERMAL CYST LEFT THUMB  09-12-2001   dr sypher  [facility] . INGUINAL HERNIA REPAIR Right 11-07-2007   dr Dalbert [facility] . KNEE ARTHROSCOPY Right 1990s . MIDDLE EAR SURGERY    cyst removal . SQUAMOUS CELL CARCINOMA EXCISION   . TRANSURETHRAL RESECTION OF PROSTATE  07-26-2000  dr Amalia Hailey [facility] .  TRANSURETHRAL RESECTION OF PROSTATE N/A 09/28/2017  Procedure: BIPOLAR TRANSURETHRAL RESECTION OF THE PROSTATE (TURP);  Surgeon: Lucas Mallow, MD;  Location: [facility];  Service: Urology;  Laterality: N/A; . VENTRICULOPERITONEAL SHUNT Right 04/08/2020  Procedure: Shunt Placment - right occipital;  Surgeon: Earnie Larsson, MD;  Location: [place];  Service: Neurosurgery;  Laterality: Right; HPI: 87 year old male with history of communicating hydrocephalus status post VP shunt, hypertension, diabetes mellitus type 2, BPH, GERD, recent CVA, hyperlipidemia presented with worsening confusion and weakness and poor p.o. intake.  Patient was recently treated for otitis media by his PCP with Omnicef and prednisone for 2 weeks.  Since then patient has gradually declined with poor p.o. intake. Surgery 03/24/21 for VP shut pressure w/o improvement.  LP pending to r/o meningitis.  Subjective: Pt awake/alert Assessment / Plan / Recommendation CHL IP CLINICAL IMPRESSIONS 03/30/2021 Clinical Impression Pt presents with a severe oropharyngeal dysphagia c/b incomplete labial seal, decreased lingual strength and coordination, reduced base of tongue retraction, delayed swallow inititiation, incomplete laryngeal closure, reduced paharyngeal constriction, decreased hyloaryngeal excursion, and diminished sensation.  These deficits resulted in penetration and/or aspiration of all consistencies trialed.  With thin liquid there was signifcant anterior spillage with the majority of the bolus lost.  With the fraction that did reach the pharynx there was penetration reaching the level of the vocal folds which did not clear with spontaneous throat clear.  Pt did no follow commands for throat clear or reswallow.  With puree there was oral holding. Larger bite increased bolus awareness and allowed for oral transit.  There was lingual pumping and discoordination.  With swallow there was minimal pharyngeal clearance of bolus with a  significant amount of bolus remaining in pharynx.  Pt did not follow cues for cleansing swallow, nor were spontaneous secondary swallows observed.  Liquid was was used in an attempt to reduce residue, which still did not fully clear.  Residuals are suspected to have mixed with subsequent trials and penetrated oral cavity with possible aspiration. With nectar thick liquid there was frank, gross aspiration during the swallow. Reflexive cough/throat clear was very weak and ineffective to clear aspiration.  Pt did not follow cues to cough or reswallow.  With honey thick liquid there was silent penetration to the level of the vocal folds with subsequent aspiration.  Of note, there was retained contrast throughout esophagus on esophageal sweep following trial of HTL.  Pt may have esophagel dysmotility as well as oropharyngeal dysphagia.  Consider GI consult or esophageal evaluation as indicated.  Of note: pt appears to have possible cervical  osteophytes near the UES opening, which did not prevent, but may have impaired, bolus flow into esophagus, especially with more viscous consistencies.  Pt is not safe for any oral diet at this time.  Recommend pt remain NPO with temporary alternate means of nutrition at present. Daughter was present for swallow evaluation.  Discussed results and recommendations following MBSS, role of SLP in dysphagia management, and alternate means of nutrition.  Pt has been NPO since Friday and she is concerned that not being able to eat is making him weaker. Daughter stated that PEG tube would not be desired, but was willing to consider non-surgical NG tube placement. Briefly discussed possibility of comfort feedings with known risk of aspiration. Consider Cortrak placement pending results of ongoing diagnostic testing. SLP Visit Diagnosis Dysphagia, oropharyngeal phase (R13.12) Attention and concentration deficit following -- Frontal lobe and executive function deficit following -- Impact on  safety and function Severe aspiration risk;Risk for inadequate nutrition/hydration   CHL IP TREATMENT RECOMMENDATION 03/30/2021 Treatment Recommendations Therapy as outlined in treatment plan below   Prognosis 03/30/2021 Prognosis for Safe Diet Advancement Guarded Barriers to Reach Goals Severity of deficits Barriers/Prognosis Comment -- CHL IP DIET RECOMMENDATION 03/30/2021 SLP Diet Recommendations NPO;Alternative means - temporary Liquid Administration via -- Medication Administration Via alternative means Compensations -- Postural Changes --   CHL IP OTHER RECOMMENDATIONS 03/28/2021 Recommended Consults -- Oral Care Recommendations -- Other Recommendations Have oral suction available   CHL IP FOLLOW UP RECOMMENDATIONS 03/30/2021 Follow up Recommendations (No Data)   CHL IP FREQUENCY AND DURATION 03/30/2021 Speech Therapy Frequency (ACUTE ONLY) min 1 x/week Treatment Duration 1 week      CHL IP ORAL PHASE 03/30/2021 Oral Phase Impaired Oral - Pudding Teaspoon -- Oral - Pudding Cup -- Oral - Honey Teaspoon -- Oral - Honey Cup Premature spillage;Decreased bolus cohesion;Left anterior bolus loss;Right anterior bolus loss;Incomplete tongue to palate contact;Weak lingual manipulation Oral - Nectar Teaspoon -- Oral - Nectar Cup Premature spillage;Decreased bolus cohesion;Incomplete tongue to palate contact;Weak lingual manipulation Oral - Nectar Straw -- Oral - Thin Teaspoon -- Oral - Thin Cup Premature spillage;Decreased bolus cohesion;Left anterior bolus loss;Incomplete tongue to palate contact;Weak lingual manipulation Oral - Thin Straw -- Oral - Puree Weak lingual manipulation;Lingual pumping;Incomplete tongue to palate contact;Reduced posterior propulsion;Holding of bolus;Lingual/palatal residue;Piecemeal swallowing;Delayed oral transit;Decreased bolus cohesion Oral - Mech Soft -- Oral - Regular -- Oral - Multi-Consistency -- Oral - Pill -- Oral Phase - Comment --  CHL IP PHARYNGEAL PHASE 03/30/2021 Pharyngeal Phase Impaired  Pharyngeal- Pudding Teaspoon -- Pharyngeal -- Pharyngeal- Pudding Cup -- Pharyngeal -- Pharyngeal- Honey Teaspoon -- Pharyngeal -- Pharyngeal- Honey Cup Delayed swallow initiation-pyriform sinuses;Reduced anterior laryngeal mobility;Reduced laryngeal elevation;Reduced airway/laryngeal closure;Reduced tongue base retraction;Penetration/Aspiration during swallow;Penetration/Apiration after swallow;Trace aspiration;Pharyngeal residue - valleculae;Pharyngeal residue - pyriform Pharyngeal Material enters airway, passes BELOW cords without attempt by patient to eject out (silent aspiration) Pharyngeal- Nectar Teaspoon -- Pharyngeal -- Pharyngeal- Nectar Cup Delayed swallow initiation-pyriform sinuses;Reduced pharyngeal peristalsis;Reduced anterior laryngeal mobility;Reduced laryngeal elevation;Reduced airway/laryngeal closure;Reduced tongue base retraction;Penetration/Aspiration before swallow;Significant aspiration (Amount);Pharyngeal residue - valleculae;Pharyngeal residue - pyriform Pharyngeal Material enters airway, passes BELOW cords and not ejected out despite cough attempt by patient Pharyngeal- Nectar Straw -- Pharyngeal -- Pharyngeal- Thin Teaspoon -- Pharyngeal -- Pharyngeal- Thin Cup Delayed swallow initiation-pyriform sinuses;Reduced anterior laryngeal mobility;Reduced laryngeal elevation;Reduced airway/laryngeal closure;Reduced tongue base retraction;Penetration/Aspiration during swallow;Penetration/Aspiration before swallow;Pharyngeal residue - valleculae;Pharyngeal residue - pyriform Pharyngeal Material enters airway, CONTACTS cords and not ejected out Pharyngeal- Thin Straw -- Pharyngeal -- Pharyngeal- Puree  Delayed swallow initiation-vallecula;Reduced anterior laryngeal mobility;Reduced laryngeal elevation;Reduced airway/laryngeal closure;Reduced tongue base retraction;Penetration/Apiration after swallow;Pharyngeal residue - valleculae;Pharyngeal residue - pyriform;Pharyngeal residue - posterior  pharnyx;Pharyngeal residue - cp segment;Inter-arytenoid space residue;Lateral channel residue Pharyngeal Material enters airway, remains ABOVE vocal cords and not ejected out Pharyngeal- Mechanical Soft -- Pharyngeal -- Pharyngeal- Regular -- Pharyngeal -- Pharyngeal- Multi-consistency -- Pharyngeal -- Pharyngeal- Pill -- Pharyngeal -- Pharyngeal Comment --  CHL IP CERVICAL ESOPHAGEAL PHASE 03/30/2021 Cervical Esophageal Phase Impaired Pudding Teaspoon -- Pudding Cup -- Honey Teaspoon -- Honey Cup -- Nectar Teaspoon -- Nectar Cup -- Nectar Straw -- Thin Teaspoon -- Thin Cup -- Thin Straw -- Puree -- Mechanical Soft -- Regular -- Multi-consistency -- Pill -- Cervical Esophageal Comment Retention of contrast in the esophagus without backflow to pharynx Celedonio Savage, MA, [place] Office: [phone] 03/30/2021, 1:40 PM              EEG adult  Result Date: 03/29/2021 Lora Havens, MD     03/29/2021 11:17 AM Patient Name: JAMERSON VONBARGEN MRN: [id number] Epilepsy Attending: Lora Havens Referring Physician/Provider: Dr Berle Mull Date: 03/29/2021 Duration: 26.41 mins Patient history: 87yo M with ams. EEG to evaluate for seizure. Level of alertness: Awake, asleep AEDs during EEG study: None Technical aspects: This EEG study was done with scalp electrodes positioned according to the 10-20 International system of electrode placement. Electrical activity was acquired at a sampling rate of 500Hz  and reviewed with a high frequency filter of 70Hz  and a low frequency filter of 1Hz . EEG data were recorded continuously and digitally stored. Description: No posterior dominant rhythm was seen. Sleep was characterized by sleep spindles (12 to 14 Hz), maximal frontocentral region. EEG showed continuous generalized 3 to 6 Hz theta-delta slowing. Hyperventilation and photic stimulation were not performed.   ABNORMALITY - Continuous slow, generalized IMPRESSION: This study is suggestive of moderate  diffuse encephalopathy, nonspecific etiology. No seizures or epileptiform discharges were seen throughout the recording. Priyanka Barbra Sarks     Scheduled Meds:   . Chlorhexidine Gluconate Cloth  6 each Topical Daily  . cyanocobalamin  1,000 mcg Subcutaneous Daily  . hydrALAZINE  10 mg Oral TID  . lidocaine (PF)  5 mL Other Once  . lidocaine (PF)      . thiamine injection  100 mg Intravenous Daily    Continuous Infusions:   . ampicillin (OMNIPEN) IV 2 g (03/30/21 1202)  . cefTRIAXone (ROCEPHIN)  IV 2 g (03/30/21 1008)  . dextrose 5% lactated ringers 75 mL/hr at 03/30/21 0606  . fluconazole (DIFLUCAN) IV 100 mg (03/30/21 1005)  . vancomycin 500 mg (03/30/21 0343)     LOS: 3 days     Vernell Leep, MD, [place], [facility]. [facility]    To contact the attending provider between 7A-7P or the covering provider during after hours 7P-7A, please log into the web site [email or web address] and access using universal [place] password for that web site. If you do not have the password, please call the hospital operator.  03/30/2021, 3:47 PM

## 2021-03-30 NOTE — Procedures (Signed)
Indication:  Risks of the procedure were dicussed with the patient's wife and daughter including post-LP headache, bleeding, infection, weakness/numbness of legs(radiculopathy).  The patient/patient's proxy agreed and written consent was obtained.   The patient was prepped and draped, and using sterile technique a 20 gauge quinke spinal needle was inserted in the L4-5 space. Approximately 2.5 cc of CSF were obtained and sent for analysis.  Patient tolerated the LP well and had some improvement in mentation post LP.  Patient to lie flat on his back for 1 hour.  [place] Pager Number [phone]

## 2021-03-30 NOTE — Progress Notes (Addendum)
Patient admitted with encephalopathy of unclear etiology.  Diagnosis of meningitis was entertained.  Lumbar puncture   Pending for evaluation of possible meningitis. Patient has history of communicating hydrocephalus status post VP shunting.  He developed bilateral subdural fluid collections necessitating adjustment of his shunt valve pressure higher.  The patient's subdural collections have essentially resolved but he continues to have mild ventriculomegaly.  Shunt appears to be operating well otherwise.  It is been readjusted to its lowest setting.  Serial CT scans demonstrate evidence for left basal ganglia subacute hypodensity consistent with infarct.  I think this is much more likely to be causing most of his encephalopathy.  At this point I see nothing neurosurgically that can be done to improve his situation, but I will re-evaluate after the results of the CSF have been obtained.  Addendum:  CSF results demonstrate no evidence of meningitis.

## 2021-03-30 NOTE — Progress Notes (Signed)
Modified Barium Swallow Progress Note  Patient Details  Name: Robert Gross MRN: [id number] Date of Birth: 1929-03-17  Today's Date: 03/30/2021  Modified Barium Swallow completed.  Full report located under Chart Review in the Imaging Section.  Brief recommendations include the following:  Clinical Impression  Pt presents with a severe oropharyngeal dysphagia c/b incomplete labial seal, decreased lingual strength and coordination, reduced base of tongue retraction, delayed swallow inititiation, incomplete laryngeal closure, reduced paharyngeal constriction, decreased hyloaryngeal excursion, and diminished sensation.  These deficits resulted in penetration and/or aspiration of all consistencies trialed.  With thin liquid there was signifcant anterior spillage with the majority of the bolus lost.  With the fraction that did reach the pharynx there was penetration reaching the level of the vocal folds which did not clear with spontaneous throat clear.  Pt did no follow commands for throat clear or reswallow.  With puree there was oral holding. Larger bite increased bolus awareness and allowed for oral transit.  There was lingual pumping and discoordination.  With swallow there was minimal pharyngeal clearance of bolus with a significant amount of bolus remaining in pharynx.  Pt did not follow cues for cleansing swallow, nor were spontaneous secondary swallows observed.  Liquid was was used in an attempt to reduce residue, which still did not fully clear.  Residuals are suspected to have mixed with subsequent trials and penetrated oral cavity with possible aspiration. With nectar thick liquid there was frank, gross aspiration during the swallow. Reflexive cough/throat clear was very weak and ineffective to clear aspiration.  Pt did not follow cues to cough or reswallow.  With honey thick liquid there was silent penetration to the level of the vocal folds with subsequent aspiration.  Of note, there was  retained contrast throughout esophagus on esophageal sweep following trial of HTL.  Pt may have esophagel dysmotility as well as oropharyngeal dysphagia.  Consider GI consult or esophageal evaluation as indicated.  Of note: pt appears to have possible cervical osteophytes near the UES opening, which did not prevent, but may have impaired, bolus flow into esophagus, especially with more viscous consistencies.    Pt is not safe for any oral diet at this time.  Recommend pt remain NPO with temporary alternate means of nutrition at present.   Daughter was present for swallow evaluation.  Discussed results and recommendations following MBSS, role of SLP in dysphagia management, and alternate means of nutrition.  Pt has been NPO since Friday and she is concerned that not being able to eat is making him weaker. Daughter stated that PEG tube would not be desired, but was willing to consider non-surgical NG tube placement. Briefly discussed possibility of comfort feedings with known risk of aspiration. Consider Cortrak placement pending results of ongoing diagnostic testing.   Swallow Evaluation Recommendations       SLP Diet Recommendations: NPO;Alternative means - temporary       Medication Administration: Via alternative means                        Celedonio Savage , MA, [place] Office: [phone]  03/30/2021,1:35 PM

## 2021-03-30 NOTE — Progress Notes (Signed)
  Speech Language Pathology Treatment: Dysphagia  Patient Details Name: Robert Gross MRN: [id number] DOB: Nov 18, 1929 Today's Date: 03/30/2021 Time: 7408-1448 SLP Time Calculation (min) (ACUTE ONLY): 12 min  Assessment / Plan / Recommendation Clinical Impression  Pt asleep on SLP arrival, but able to rouse.  Dried secretions in oral cavity.  Xerostomia noted.  SLP provided oral care prior to administration of PO trials.  Pt very resistant to oral care. There was no oral response to ice chip placed in oral cavity.  SLP removed with digital sweep.   With thin liquid by cup there was significant anterior spillage.  Pt required multiple swallows. There was wet vocal quality.  Stridorous respiration heard on initial trial only after swallow.  There was occasional weak throat clear.  With puree, there was prolonged oral phase, seemingly delayed swallow initiation and diffuse mild oral residue which was reduced with liquid wash.  Daughter arrived after completion of session. She reports pt was chopped finely with lots of gravies and pt was consuming mostly liquid.  Will plan for MBS later this date to determine if there is a safe oral diet for patient at this time.  Counseled daughter to consider if AMN is something they would want to pursue.    HPI HPI: 87 year old male with history of communicating hydrocephalus status post VP shunt, hypertension, diabetes mellitus type 2, BPH, GERD, recent CVA, hyperlipidemia presented with worsening confusion and weakness and poor p.o. intake.  Patient was recently treated for otitis media by his PCP with Omnicef and prednisone for 2 weeks.  Since then patient has gradually declined with poor p.o. intake. Surgery 03/24/21 for VP shut pressure w/o improvement.  LP pending to r/o meningitis.      SLP Plan  MBS       Recommendations  Diet recommendations: NPO                Oral Care Recommendations: Oral care QID Follow up Recommendations:  (TBD) SLP Visit  Diagnosis: Dysphagia, oropharyngeal phase (R13.12) Plan: MBS       GO                Celedonio Savage, MA, [place] Office: [phone] 03/30/2021, 10:11 AM

## 2021-03-31 ENCOUNTER — Inpatient Hospital Stay (HOSPITAL_COMMUNITY): Payer: Medicare PPO

## 2021-03-31 DIAGNOSIS — G9341 Metabolic encephalopathy: Secondary | ICD-10-CM | POA: Diagnosis not present

## 2021-03-31 DIAGNOSIS — R1310 Dysphagia, unspecified: Secondary | ICD-10-CM | POA: Diagnosis not present

## 2021-03-31 LAB — GLUCOSE, CAPILLARY
Glucose-Capillary: 122 mg/dL — ABNORMAL HIGH (ref 70–99)
Glucose-Capillary: 152 mg/dL — ABNORMAL HIGH (ref 70–99)
Glucose-Capillary: 155 mg/dL — ABNORMAL HIGH (ref 70–99)
Glucose-Capillary: 160 mg/dL — ABNORMAL HIGH (ref 70–99)
Glucose-Capillary: 178 mg/dL — ABNORMAL HIGH (ref 70–99)
Glucose-Capillary: 249 mg/dL — ABNORMAL HIGH (ref 70–99)

## 2021-03-31 LAB — BASIC METABOLIC PANEL
Anion gap: 5 (ref 5–15)
BUN: 10 mg/dL (ref 8–23)
CO2: 25 mmol/L (ref 22–32)
Calcium: 9.6 mg/dL (ref 8.9–10.3)
Chloride: 111 mmol/L (ref 98–111)
Creatinine, Ser: 0.58 mg/dL — ABNORMAL LOW (ref 0.61–1.24)
GFR, Estimated: >60 mL/min (ref >60)
Glucose, Bld: 172 mg/dL — ABNORMAL HIGH (ref 70–99)
Potassium: 3.6 mmol/L (ref 3.5–5.1)
Sodium: 141 mmol/L (ref 135–145)

## 2021-03-31 LAB — MAGNESIUM
Magnesium: 1.6 mg/dL — ABNORMAL LOW (ref 1.7–2.4)
Magnesium: 2 mg/dL (ref 1.7–2.4)
Magnesium: 2 mg/dL (ref 1.7–2.4)

## 2021-03-31 LAB — HSV 1/2 PCR, CSF
HSV-1 DNA: NEGATIVE
HSV-2 DNA: NEGATIVE

## 2021-03-31 LAB — FOLATE: Folate: 4 ng/mL — ABNORMAL LOW (ref >5.9)

## 2021-03-31 LAB — PHOSPHORUS
Phosphorus: 1.6 mg/dL — ABNORMAL LOW (ref 2.5–4.6)
Phosphorus: 1.5 mg/dL — ABNORMAL LOW (ref 2.5–4.6)

## 2021-03-31 MED ORDER — MAGNESIUM SULFATE 2 GM/50ML IV SOLN
2.0000 g | Freq: Once | INTRAVENOUS | Status: AC
  Administered 2021-03-31: 2 g via INTRAVENOUS
  Filled 2021-03-31: qty 50

## 2021-03-31 MED ORDER — FOLIC ACID 5 MG/ML IJ SOLN
1.0000 mg | Freq: Every day | INTRAMUSCULAR | Status: DC
  Administered 2021-03-31 – 2021-04-07 (×8): 1 mg via INTRAVENOUS
  Filled 2021-03-31 (×8): qty 0.2

## 2021-03-31 MED ORDER — OSMOLITE 1.5 CAL PO LIQD
1000.0000 mL | ORAL | Status: DC
  Administered 2021-03-31 – 2021-04-06 (×4): 1000 mL
  Filled 2021-03-31 (×6): qty 1000

## 2021-03-31 MED ORDER — POTASSIUM CHLORIDE 10 MEQ/100ML IV SOLN
INTRAVENOUS | Status: AC
  Filled 2021-03-31: qty 100

## 2021-03-31 MED ORDER — CYANOCOBALAMIN 1000 MCG/ML IJ SOLN
1000.0000 ug | INTRAMUSCULAR | Status: DC
  Administered 2021-04-01: 1000 ug via INTRAMUSCULAR
  Filled 2021-03-31: qty 1

## 2021-03-31 MED ORDER — MUSCLE RUB 10-15 % EX CREA
TOPICAL_CREAM | CUTANEOUS | Status: DC | PRN
  Filled 2021-03-31 (×2): qty 85

## 2021-03-31 MED ORDER — PROSOURCE TF PO LIQD
45.0000 mL | Freq: Every day | ORAL | Status: DC
  Administered 2021-03-31 – 2021-04-07 (×8): 45 mL
  Filled 2021-03-31 (×8): qty 45

## 2021-03-31 MED ORDER — LIDOCAINE VISCOUS HCL 2 % MT SOLN
15.0000 mL | Freq: Once | OROMUCOSAL | Status: AC | PRN
  Administered 2021-03-31: 3 mL via OROMUCOSAL

## 2021-03-31 NOTE — Progress Notes (Signed)
Initial Nutrition Assessment  DOCUMENTATION CODES:  Not applicable  INTERVENTION:  Monitor magnesium, potassium, and phosphorus daily for at least 3 days, MD to replete as needed, as pt is at risk for refeeding syndrome given ongoing poor/no po intake.  Begin TF via NGT: -Osmolite 1.5 @ 18ml/hr, advance by 23ml/hr Q12H until goal rate of 67ml/hr (1246ml) is reached -22ml Prosource TF daily  At goal, TF will provide 1840 kcals, 86g protein, 966ml free water Meets 100% estimated needs  NUTRITION DIAGNOSIS:  Inadequate oral intake related to inability to eat as evidenced by NPO status.  GOAL:  Patient will meet greater than or equal to 90% of their needs  MONITOR:  TF tolerance,Weight trends,Labs,I & O's  REASON FOR ASSESSMENT:  Consult Enteral/tube feeding initiation and management  ASSESSMENT:  Pt admitted with acute metabolic encephalopathy. PMH includes communicating hydrocephalus s/p VP shunt, dementia, HTN, type 2 DM, BPH, GERD, recent CVA, HLD  5/9 s/p LP  Pt not in room at time of RD visit. Per H&P, pt was doing well until ~6 weeks ago when he developed an ear infection which lead to HTN and hyperglycemia 2/2 steroid treatment. Once the steroid was stopped, pt had a sharp decline in health w/ progressive weakness. CT head for shunt f/u on 4/5 showed subacute infarct. 1 week PTA pt was also diagnosed with UTI. VP shunt readjusted on 03/24/21. Pt has reportedly been unable to get out of bed and has had difficulty eating.  Discussed pt with RN. Pt in IR having NGT placed. Pt was unable to receive Cortrak yesterday due to order being placed after service had already ended; Cortrak not available again until tomorrow.    Reviewed weight history; no significant weight loss noted.   UOP: 831ml x24 hours  Medications: folic acid, vitamin K44, SSI Q4H, thiamine IVF: D5 in LR @ 27ml/hr  Labs: Mg 1.6 (L), Folate 4.0 (L) CBGs [phone]  Diet Order:   Diet Order             Diet NPO time specified  Diet effective now                 EDUCATION NEEDS:  No education needs have been identified at this time  Skin:  Skin Assessment: Reviewed RN Assessment  Last BM:  5/7  Height:  Ht Readings from Last 1 Encounters:  03/29/21 5\' 8"  (1.727 m)   Weight:  Wt Readings from Last 1 Encounters:  03/29/21 69.3 kg   BMI:  Body mass index is 23.23 kg/m.  Estimated Nutritional Needs:  Kcal:  6440-3474 Protein:  85-95g Fluid:  >1.75 L/d   Larkin Ina, MS, RD, LDN RD pager number and weekend/on-call pager number located in [place].

## 2021-03-31 NOTE — Progress Notes (Signed)
NEUROLOGY CONSULTATION PROGRESS NOTE   Date of service: Mar 31, 2021 Patient Name: Robert Gross MRN:  [id number] DOB:  02-10-1929  Brief HPI   Robert Gross is a 87 year old male with a medical history significant for communicating hydrocephalus s/p VP shunt, family reported dementia, CVA, hypertension, hyperlipidemia, type 2 diabetes mellitus, memory loss, and benign prostatic hyperplasia who presented to [facility] 5/6 for evaluation of progressive confusion, weakness, and poor oral intake. The decline in mental status started approximately 6 weeks ago with a more rapid decline over the past 2 weeks with patient becoming nonverbal and increasingly lethargic. Over the past 6 weeks Mr. Lauture has been treated for an ear infection with Cefidinir and a 12 day dose of prednisone, a UTI with Cipro, and had his shunt manipulated.  Initial exam with non verbal, unable to follow commands with concerns for nuchal rigidity and leukocytosis of 13.5. Kernigs and brudzinski signs were positive vs torticollos and he was started on meningitis coverage with Vanc, Ceftriaxone and Ampicillin.  EEG with moderate diffuse encephalopathy, CTH without contrast with Mildly dilated lateral ventricles slightly greater on LEFT, minimally increased from previous exam atrophy with small vessel chronic ischemic changes of deep cerebral white matter.   Interval Hx   LP with mildly elevated protein but normal when accounting for his advanced age. CSF Gram stain, Cx negative and normal WBC. CSF not consistent with an infection. MRI Brain is pending.  Vitals   Vitals:   03/30/21 2347 03/31/21 0316 03/31/21 0400 03/31/21 0700  BP: (!) 171/89 (!) 189/94 (!) 160/90 (!) 164/77  Pulse: 78 63 72 70  Resp: 18 18  18   Temp: 97.6 F (36.4 C) 97.8 F (36.6 C)  97.7 F (36.5 C)  TempSrc: Oral Oral  Oral  SpO2: 97% 96%  99%  Weight:      Height:         Body mass index is 23.23 kg/m.  Physical Exam   General: Laying  comfortably in bed; in no acute distress. HENT: Normal oropharynx and mucosa. Normal external appearance of ears and nose. Neck: Supple, concern for torticollis/neck spasm and neck turned to left. CV: No JVD. No peripheral edema. Pulmonary: Symmetric Chest rise. Normal respiratory effort. Abdomen: Soft to touch, non-tender. Ext: No cyanosis, edema, or deformity Skin: No rash. Normal palpation of skin.  Musculoskeletal: Normal digits and nails by inspection. No clubbing.  Neurologic Examination  Mental status/Cognition: Partially opens eyes to tactile stimulation. Awakens and grimaces when neck is moved to the right. Does not follow commands. Does make eye contact briefly. Speech/language: no speech. Cranial nerves:   CN II Pupils equal and reactive to light, blinks to threat BL.   CN [name],IV,VI EOM Intact ot head movement, no gaze preference.   CN V    CN VII no asymmetry, ? Mild L nasolabial fold flattening vs facial fold.   CN VIII    CN IX & X Mouth open, norml oropharynx as far as I can see   CN XI    CN XII midline tongue, does not protrude when asked.   Motor:  Muscle bulk: poor, tone normal. Unable to do detailed strength testing due to AMS. BL arms drift down to the bed when held up in air. BL legs drift down to the bed when held up off the bed. Localizes to pain in all extremities.  Reflexes:  Right Left Comments  Pectoralis      Biceps (C5/6) 2 2   Brachioradialis (  C5/6) 2 2    Triceps (C6/7) 2 2    Patellar (L3/4) 1 1    Achilles (S1)      Hoffman      Plantar     Jaw jerk    Sensation: Localizes to mild pinch in all extremities  Coordination/Complex Motor:  Unable to assess but no obvious ataxia or tremor.  Labs   Basic Metabolic Panel:  Lab Results  Component Value Date   NA 141 03/31/2021   K 3.6 03/31/2021   CO2 25 03/31/2021   GLUCOSE 172 (H) 03/31/2021   BUN 10 03/31/2021   CREATININE 0.58 (L) 03/31/2021   CALCIUM 9.6 03/31/2021   GFRNONAA >60  03/31/2021   GFRAA >60 06/16/2020   HbA1c:  Lab Results  Component Value Date   HGBA1C 9.3 (H) 03/30/2021   LDL: No results found for: [facility] Urine Drug Screen: No results found for: LABOPIA, COCAINSCRNUR, LABBENZ, AMPHETMU, THCU, LABBARB  Alcohol Level No results found for: ETH No results found for: PHENYTOIN, ZONISAMIDE, LAMOTRIGINE, LEVETIRACETA No results found for: PHENYTOIN, PHENOBARB, VALPROATE, CBMZ  Imaging and Diagnostic studies  Results for orders placed during the hospital encounter of 04/10/2021  CT Head Wo Contrast Mildly dilated lateral ventricles slightly greater on LEFT, minimally increased from previous exam. Atrophy with small vessel chronic ischemic changes of deep cerebral white matter. No additional intracranial abnormalities.  MRI Brain without contrast: Pending  CSF WBC count: 1 CSF Protein: 88 CSF Gram Stain and Cultures: NO WBC SEEN, NO ORGANISMS SEEN,  CYTOSPIN SMEAR. NO GROWTH < 24 HOURS(full cultures pending). CSF Cryptococcus: Negative. HSV1/2 PCR CSF: pending. CSF lactate: unable to run due to CSF sample being used up in other testing.  Folate: 4.0 Vit B12: 356. Vit B1 pending. TSH mildly elevated with normal Free T4.   Impression   Robert Gross is a 87 year old male who presented to [facility] 5/6 for evaluation of progressive confusion, weakness, and poor oral intake. The decline in mental status started approximately 6 weeks ago with a more rapid decline over the past 2 weeks with patient becoming nonverbal and increasingly lethargic. Over the past 6 weeks Mr. Jollie has been treated for an ear infection with Cefidinir and a 12 day dose of prednisone, a UTI with Cipro, and had his shunt manipulated.  Initial exam with non verbal, unable to follow commands with concerns for nuchal rigidity and leukocytosis of 13.5. Kernigs and brudzinski signs were positive vs torticollos and he was started on meningitis coverage with Vanc, Ceftriaxone and  Ampicillin. LP is non infectious and thus Meningitis coverage was stopped. Rest of the workup so far with EEG with moderate diffuse encephalopathy, CTH without contrast with Mildly dilated lateral ventricles slightly greater on LEFT, minimally increased from previous exam atrophy with small vessel chronic ischemic changes of deep cerebral white matter.  Nutritional studies with low Folate, low normal Vit B12, thiamine levels pending.  I suspect that his prolonged encephalopathy is potentially either due to delirium with some component of nutritional deficiency likely adding to it. Poor CSF drainage after adjustment of the shunt valve higher may also be contributing. As for the noted subacute basal ganglia stroke, I would expect that to present with focal deficit rather than persistent encephalopathy as brought up by the neurosurgery team.  Another thought and something that the family brought up is that he also had the shunt turned down a couple days prior to being diagnosed with an ear infection and was started on Antibiotics  and Prednisone. He has gone down hill since.  If we do not see any improvement despite replacing Folate and MRI is otherwise negative, will reach out to Neurosurgery team to see if it is reasonable to lower the shunt valve pressure and assess for any improvement.  Recommendations  - delirium precuations - I ordered heating pads and topical menthol gel or the neck spasms. - I ordered MRI Brain without contrast - I discontinued meningitis dose Ceftriaxone, Ampicillin and Vancomycin. Discussed with primary team before discontinuing and they are not particularly worried about another infection. - I ordered folic acid IV 1mg  daily. Can change to PO when able. - continue Cyanocobalamin 1080mcg daily IM x 3 doses, then IM Q30 days. - Vit B1 levels pending. Continue Thiamine 100mg  daily PO or IV while levels are pending. - Neurology will continue to follow  along.  _________________________________________________________________   Thank you for the opportunity to take part in the care of this patient. If you have any further questions, please contact the neurology consultation attending.  Signed,  [place] Pager Number [phone]

## 2021-03-31 NOTE — Progress Notes (Addendum)
PROGRESS NOTE   Robert Gross  [id number]    DOB: [birth year]    DOA: 04/05/2021  PCP: [name], MD   I have briefly reviewed patients previous medical records in [facility].  Chief Complaint  Patient presents with  . Altered Mental Status    Brief Narrative:  87 year old male with medical history significant for but not limited to communicating hydrocephalus s/p VP shunt, family reported dementia, hypertension, DM2, BPH, GERD, recent CVA, hyperlipidemia, presented with worsening confusion, weakness and poor oral intake.  He was reportedly doing well about 6 weeks ago, then sometime in March she was diagnosed with an ear infection and started on antibiotics and 2 weeks of prednisone.  During the steroid course, he had hypertension and hyperglycemia up to the 200s.  Once the prednisone was stopped, there was a sharp decline in his health and progressive weakness.  CT head for shunt follow-up 4/5 showed new subacute infarct.  Week prior to admission, he was also diagnosed with yeast UTI and started on fluconazole.  Seen by neurosurgery and VP shunt readjusted on 03/24/2021.  He progressively worsened, unable to get out of bed, difficulty eating.  Admitted initially for acute metabolic encephalopathy, hypernatremia, hypercalcemia, dehydration, suspected UTI.  He had complained of neck stiffness for 2 weeks at home, due to concern for CNS infection, empiric meningitis coverage was started and patient was transferred from [facility] to [facility] for further evaluation and management.  No acyclovir started due to low suspicion for herpes encephalitis.  Neurology and neurosurgery were consulted.  Patient underwent LP 5/9.  Ongoing AMS with unsafe oral intake.  Family opted for [facility] tube placement and tube feeds for now.   Assessment & Plan:  Principal Problem:   Acute metabolic encephalopathy Active Problems:   Essential hypertension   Diabetes mellitus without complication (HCC)    UTI (urinary tract infection)   Communicating hydrocephalus (HCC)   Hypercalcemia   Hypernatremia   History of CVA (cerebrovascular accident)   Acute metabolic encephalopathy Decline in mental status started approximately 6 weeks PTA with a more rapid decline over the past 2 weeks PTA when he became nonverbal and increasingly lethargic.  Over the past 6 weeks, he was treated for an ear infection with cefdinir and a 12-day course of prednisone, UTI with Cipro and fluconazole and his VP shunt was readjusted by neurosurgery.  CT head: Mildly dilated lateral ventricles slightly greater on the left, minimally increased from previous exam.  EEG: Suggestive of moderate diffuse encephalopathy, nonspecific etiology.  No seizures or epileptiform discharges seen throughout the recording.  Vitamin B12: 356.  Vitamin B1: Pending. TSH: 4.637, Free T4: 0.97.  Ammonia 28.  Urine culture: No growth.  Blood cultures x2: Negative to date.  CSF work-up not suggestive of bacterial meningitis.  Chest x-ray unremarkable.  As per neurosurgery, no obstructive hydrocephalus.  Empirically started meningitic coverage with IV ampicillin, ceftriaxone, and vancomycin discontinued 5/10.  Neurology following.  As per neurosurgery, left basal ganglia subacute hypodensity consistent with infarct more likely causing his encephalopathy. Does not appear to have significant improvement in mental status.  Defer to neurology regarding MRI brain.  Also on IV thiamine pending B1 results.  This is likely multifactorial.  Was on empiric IV fluconazole for yeast UTI PTA, urine culture now negative-discontinued fluconazole.    Discussed with neurology, follow-up appreciated, they suspect that his prolonged encephalopathy is potentially either due to delirium with some component of nutritional deficiency added to it.  Poor CSF drainage after adjustment of shunt valve higher may be contributing.  They do not feel that the subacute basal ganglia stroke is  contributing.  They suggest that if there is no improvement despite replacing low folate and MRI is negative then neurosurgery to see if it is reasonable to lower the shunt valve pressure and assess for improvement.  Delirium precautions, continue folate, B12 and thiamine supplementation.    Neck rigidity/possible torticollis Evaluation negative for meningitis.  Symptomatic treatment with heating pads, topical menthol and Tylenol.  Avoid opiates or sedatives.  Seems somewhat better today.  History of communicating hydrocephalus s/p VP shunt Neurosurgery following.  As per neurosurgery, he developed bilateral subdural fluid collections necessitating adjustment of his shunt valve pressure higher.  His subdural collections essentially resolved but he continues to have mild ventriculomegaly.  Shunt appears to be operating well otherwise.  AKI, mild: Resolved.  Lisinopril on hold.  Follow BMP.  Dehydration with hypernatremia: Serum sodium 150 on admission, likely related to poor oral intake.  Resolved.  Hypercalcemia Noted to have serum calcium of 11.5.  Resolved.  Corrected serum calcium today is 10.9.  Hypokalemia/hypomagnesemia Hypokalemia replaced.  Replace hypokalemia and follow labs in a.m.  Dysphagia Unsafe oral intake due to AMS.  Speech therapy recommends NPO and alternate means of nutrition.  I had a long discussion with patient's daughter on 5/9 and the next daughter who is a pharmacist on 5/10, advised alternative nutrition options, risks and benefits, including temporary Panda tube feeds versus PEG, continued IVF for few more days to see if mental status improves and he can swallow safely.  I do not think TPN is indicated.  Family have opted for Panda tube feeds, ordered Panda tube and dietitian consult for same.  NG tube yet to be placed, discussed with RN.  Type II DM Monitor CBGs and SSI as needed.  Metformin on hold.  CVA Per CT head 03/16/2021: Stable appearance of hypodensity  seen in the left anterior basal ganglia consistent with subacute infarction.  Defer to neurology regarding MRI brain and aspirin  Essential hypertension Mildly uncontrolled.  Hold lisinopril.  As needed IV hydralazine.  Thrombocytopenia: Unclear etiology.?  Related to meds.  Resolved.  Adult failure to thrive Likely multifactorial.  Monitor closely.  If no significant and sustained improvement, may need to consider palliative care consultation to address goals of care.  Urinary retention Has indwelling Foley catheter.  Discussed with RN regarding DC Foley catheter and monitor for voiding and hopefully do not have to place back again.  Body mass index is 23.23 kg/m.    DVT prophylaxis: Place and maintain sequential compression device Start: 03/29/21 0749     Code Status: DNR Family Communication: Discussed in detail with patient's next daughter who is a Software engineer in [place] at bedside, updated care and answered all questions. Disposition:  Status is: Inpatient  Remains inpatient appropriate because:Inpatient level of care appropriate due to severity of illness   Dispo: The patient is from: Home              Anticipated d/c is to: TBD              Patient currently is not medically stable to d/c.   Difficult to place patient No        Consultants:   Neurosurgery Neurology  Procedures:   LP by neurology 5/9.  Antimicrobials:    Anti-infectives (From admission, onward)   Start     Dose/Rate Route Frequency Ordered  Stop   03/28/21 2200  cefTRIAXone (ROCEPHIN) 2 g in sodium chloride 0.9 % 100 mL IVPB  Status:  Discontinued        2 g 200 mL/hr over 30 Minutes Intravenous Every 12 hours 03/28/21 1244 03/31/21 0732   03/28/21 1800  vancomycin (VANCOREADY) IVPB 1000 mg/200 mL  Status:  Discontinued        1,000 mg 200 mL/hr over 60 Minutes Intravenous Every 24 hours 03/28/21 0006 03/28/21 0757   03/28/21 1600  vancomycin (VANCOREADY) IVPB 500 mg/100 mL  Status:   Discontinued        500 mg 100 mL/hr over 60 Minutes Intravenous Every 12 hours 03/28/21 1457 03/31/21 0732   03/28/21 1400  fluconazole (DIFLUCAN) IVPB 100 mg  Status:  Discontinued        100 mg 50 mL/hr over 60 Minutes Intravenous Every 24 hours 03/28/21 1239 03/30/21 1639   03/28/21 1330  vancomycin (VANCOREADY) IVPB 1000 mg/200 mL  Status:  Discontinued        1,000 mg 200 mL/hr over 60 Minutes Intravenous  Once 03/28/21 1244 03/28/21 1259   03/28/21 1300  ampicillin (OMNIPEN) 2 g in sodium chloride 0.9 % 100 mL IVPB  Status:  Discontinued        2 g 300 mL/hr over 20 Minutes Intravenous Every 6 hours 03/28/21 1244 03/31/21 0732   03/28/21 1000  fluconazole (DIFLUCAN) tablet 100 mg  Status:  Discontinued        100 mg Oral Daily 04/20/2021 2341 03/28/21 1239   03/28/21 0800  ceFEPIme (MAXIPIME) 2 g in sodium chloride 0.9 % 100 mL IVPB  Status:  Discontinued        2 g 200 mL/hr over 30 Minutes Intravenous Every 12 hours 03/28/21 0006 03/28/21 1244   03/28/21 0400  metroNIDAZOLE (FLAGYL) IVPB 500 mg  Status:  Discontinued        500 mg 100 mL/hr over 60 Minutes Intravenous Every 8 hours 04/18/2021 2346 03/28/21 0757   03/31/2021 1815  metroNIDAZOLE (FLAGYL) IVPB 500 mg        500 mg 100 mL/hr over 60 Minutes Intravenous  Once 03/31/2021 1805 04/04/2021 2049   04/08/2021 1815  ceFEPIme (MAXIPIME) 2 g in sodium chloride 0.9 % 100 mL IVPB        2 g 200 mL/hr over 30 Minutes Intravenous  Once 04/16/2021 1808 04/12/2021 2049   04/07/2021 1800  vancomycin (VANCOCIN) IVPB 1000 mg/200 mL premix        1,000 mg 200 mL/hr over 60 Minutes Intravenous  Once 03/26/2021 1752 04/19/2021 1951   03/24/2021 1515  cefTRIAXone (ROCEPHIN) 1 g in sodium chloride 0.9 % 100 mL IVPB  Status:  Discontinued        1 g 200 mL/hr over 30 Minutes Intravenous Every 24 hours 04/18/2021 1513 03/23/2021 1808        Subjective:  Interviewed and examined along with patient's daughter at bedside.  Looks better compared to yesterday.   Alert, tracking activity around with his eyes, able to say his name.  Not following much commands.  Nods his head at times to questions.  Appears hard of hearing.  Objective:   Vitals:   03/30/21 2347 03/31/21 0316 03/31/21 0400 03/31/21 0700  BP: (!) 171/89 (!) 189/94 (!) 160/90 (!) 164/77  Pulse: 78 63 72 70  Resp: 18 18  18   Temp: 97.6 F (36.4 C) 97.8 F (36.6 C)  97.7 F (36.5 C)  TempSrc: Oral Oral  Oral  SpO2: 97% 96%  99%  Weight:      Height:        General exam: Very elderly male, moderately built, frail, chronically ill looking lying supine in bed head/neck now in midline.  Overall looks better compared to yesterday.  More alert and interactive. Neck: Now in midline. Respiratory system: Clear to auscultation.  No increased work of breathing. Cardiovascular system: S1 and S2 heard, RRR.  No JVD, murmurs or pedal edema.  Telemetry personally reviewed: Sinus rhythm. Gastrointestinal system: Abdomen is nondistended, soft and nontender. No organomegaly or masses felt. Normal bowel sounds heard. GU: Has indwelling Foley catheter. Central nervous system: Alert, tracks activity around with his eyes, oriented to self, more interactive and nods at times to questions by his daughter.  Appears hard of hearing. Extremities: Purposeful movements of right upper extremity.  Withdraws lower extremities to pain.  Left upper extremity at least grade 2 x 5 power.  A drop of blood oozing from underneath the right great toe nailbed- advised RN to monitor and dress. Skin: No rashes, lesions or ulcers Psychiatry: Judgement and insight impaired. Mood & affect cannot be assessed.     Data Reviewed:   I have personally reviewed following labs and imaging studies   CBC: Recent Labs  Lab 04/06/2021 1406 03/28/21 0349 03/29/21 0536 03/30/21 1659  WBC 14.7* 13.5* 11.7* 9.6  NEUTROABS 12.2*  --  8.3*  --   HGB 15.8 13.2 13.3 13.6  HCT 48.1 40.7 39.6 40.4  MCV 99.0 101.2* 97.8 96.4  PLT 184  143* 134* [id number]    Basic Metabolic Panel: Recent Labs  Lab 03/28/21 1318 03/28/21 2102 03/29/21 0633 03/29/21 1231 03/29/21 2045 03/30/21 1659 03/31/21 0358  NA 143   < > 145   < > 142 142 141  K 3.5   < > 4.0   < > 3.0* 3.0* 3.6  CL 118*   < > 116*   < > 115* 112* 111  CO2 21*   < > 25   < > 23 24 25   GLUCOSE 195*   < > 153*   < > 160* 152* 172*  BUN 32*   < > 21   < > 16 13 10   CREATININE 0.78   < > 0.76   < > 0.62 0.62 0.58*  CALCIUM 9.7   < > 9.8   < > 9.3 9.6 9.6  MG 1.5*  --  2.0  --   --   --  1.6*   < > = values in this interval not displayed.    Liver Function Tests: Recent Labs  Lab 04/05/2021 1406 03/29/21 0633  AST 16 14*  ALT 14 13  ALKPHOS 134* 97  BILITOT 0.5 0.3  PROT 6.0* 4.5*  ALBUMIN 2.6* 2.0*    CBG: Recent Labs  Lab 03/31/21 0411 03/31/21 0732 03/31/21 1204  GLUCAP 160* 155* 152*    Microbiology Studies:   Recent Results (from the past 240 hour(s))  Culture, blood (Routine x 2)     Status: None (Preliminary result)   Collection Time: 04/19/2021  2:06 PM   Specimen: BLOOD  Result Value Ref Range Status   Specimen Description   Final    BLOOD LEFT ANTECUBITAL Performed at [facility], [place] [address], [place], [place] [address]    Special Requests   Final    BOTTLES DRAWN AEROBIC AND ANAEROBIC Blood Culture adequate volume   Culture   Final  NO GROWTH 4 DAYS Performed at [facility], [place] [address] [place], [place] [address]    Report Status PENDING  Incomplete  Culture, blood (Routine x 2)     Status: None (Preliminary result)   Collection Time: 04/22/2021  2:12 PM   Specimen: Site Not Specified; Blood  Result Value Ref Range Status   Specimen Description   Final    SITE NOT SPECIFIED Performed at [place] [address] [place], [place] [address]    Special Requests   Final    BOTTLES DRAWN AEROBIC AND ANAEROBIC Blood Culture adequate volume   Culture   Final    NO GROWTH 4  DAYS Performed at [facility], [place] [address], [place], [place] [address]    Report Status PENDING  Incomplete  Urine culture     Status: None   Collection Time: Apr 22, 2021  2:14 PM   Specimen: Urine, Random  Result Value Ref Range Status   Specimen Description   Final    URINE, RANDOM Performed at [place] [address] [place], [place] [address]    Special Requests   Final    NONE Performed at [facility] [place] [address] [place], [place] [address]    Culture   Final    NO GROWTH Performed at [facility], [place] [address], [place], [place] [address]    Report Status 03/28/2021 FINAL  Final  Resp Panel by RT-PCR (Flu A&B, Covid) Nasopharyngeal Swab     Status: None   Collection Time: 04/22/21  3:15 PM   Specimen: Nasopharyngeal Swab; Nasopharyngeal(NP) swabs in vial transport medium  Result Value Ref Range Status   SARS Coronavirus 2 by RT PCR NEGATIVE NEGATIVE Final    Comment: (NOTE) SARS-CoV-2 target nucleic acids are NOT DETECTED.  The SARS-CoV-2 RNA is generally detectable in upper respiratory specimens during the acute phase of infection. The lowest concentration of SARS-CoV-2 viral copies this assay can detect is 138 copies/mL. A negative result does not preclude SARS-Cov-2 infection and should not be used as the sole basis for treatment or other patient management decisions. A negative result may occur with  improper specimen collection/handling, submission of specimen other than nasopharyngeal swab, presence of viral mutation(s) within the areas targeted by this assay, and inadequate number of viral copies(<138 copies/mL). A negative result must be combined with clinical observations, patient history, and epidemiological information. The expected result is Negative.  Fact Sheet for Patients:  EntrepreneurPulse.com.au  Fact Sheet for Healthcare Providers:   IncredibleEmployment.be  This test is no t yet approved or cleared by the [place] FDA and  has been authorized for detection and/or diagnosis of SARS-CoV-2 by FDA under an Emergency Use Authorization (EUA). This EUA will remain  in effect (meaning this test can be used) for the duration of the COVID-19 declaration under Section 564(b)(1) of the Act, 21 U.S.C.section 360bbb-3(b)(1), unless the authorization is terminated  or revoked sooner.       Influenza A by PCR NEGATIVE NEGATIVE Final   Influenza B by PCR NEGATIVE NEGATIVE Final    Comment: (NOTE) The Xpert Xpress SARS-CoV-2/FLU/RSV plus assay is intended as an aid in the diagnosis of influenza from Nasopharyngeal swab specimens and should not be used as a sole basis for treatment. Nasal washings and aspirates are unacceptable for Xpert Xpress SARS-CoV-2/FLU/RSV testing.  Fact Sheet for Patients: EntrepreneurPulse.com.au  Fact Sheet for Healthcare Providers: IncredibleEmployment.be  This test is  not yet approved or cleared by the [place] and has been authorized for detection and/or diagnosis of SARS-CoV-2 by FDA under an Emergency Use Authorization (EUA). This EUA will remain in effect (meaning this test can be used) for the duration of the COVID-19 declaration under Section 564(b)(1) of the Act, 21 U.S.C. section 360bbb-3(b)(1), unless the authorization is terminated or revoked.  Performed at [facility], [place] [address] [place],  [address]   CSF culture w Stat Gram Stain     Status: None (Preliminary result)   Collection Time: 03/30/21  1:48 PM   Specimen: CSF; Cerebrospinal Fluid  Result Value Ref Range Status   Specimen Description CSF  Final   Special Requests NONE  Final   Gram Stain NO WBC SEEN NO ORGANISMS SEEN CYTOSPIN SMEAR   Final   Culture   Final    NO GROWTH < 24 HOURS Performed at [place], [place] [address] [place],  [address]    Report Status PENDING  Incomplete     Radiology Studies:  DG Swallowing Func-Speech Pathology  Result Date: 03/30/2021 Objective Swallowing Evaluation: Type of Study: MBS-Modified Barium Swallow Study  Patient Details Name: ADALBERT DUMITRESCU MRN: [id number] Date of Birth: Oct 04, 1929 Today's Date: 03/30/2021 Time: SLP Start Time (ACUTE ONLY): 1226 -SLP Stop Time (ACUTE ONLY): 1250 SLP Time Calculation (min) (ACUTE ONLY): 24 min Past Medical History: Past Medical History: Diagnosis Date . Arthritis  . BPH (benign prostatic hyperplasia)  . BPH with urinary obstruction  . Cataracts, bilateral  . Dizzy spells   residual from concussion 09-05-2017 intermittantly when turns head but stated as of 09-26-2017 no issues for past 2-3 days . Essential hypertension  . Gait abnormality  . GERD (gastroesophageal reflux disease)  . History of concussion   09-05-2017  w/ loc --- per pt residual intermittant dizziness when he turns his head either way . History of prostatitis  . History of sepsis   09-12-2017  urosepsis . History of urinary retention  . Hydrocephalus ([place])  . Hypercalcemia  . Hyperlipemia  . Memory loss  . Metabolic encephalopathy  . Osteopenia  . Squamous cell carcinoma in situ   multiple spots . Type 2 diabetes mellitus ([place])  . Urinary retention   requires intermittent caths . Vitamin D deficiency  . Vocal cord paralysis  . Wears partial dentures   upper Past Surgical History: Past Surgical History: Procedure Laterality Date . CATARACT EXTRACTION W/ INTRAOCULAR LENS  IMPLANT, BILATERAL  03/2017 . EXICISION EPIDERMAL CYST LEFT THUMB  09-12-2001   dr sypher  [facility] . INGUINAL HERNIA REPAIR Right 11-07-2007   dr Dalbert [facility] . KNEE ARTHROSCOPY Right 1990s . MIDDLE EAR SURGERY    cyst removal . SQUAMOUS CELL CARCINOMA EXCISION   . TRANSURETHRAL RESECTION OF PROSTATE  07-26-2000  dr Amalia [facility] . TRANSURETHRAL RESECTION OF PROSTATE N/A 09/28/2017  Procedure: BIPOLAR  TRANSURETHRAL RESECTION OF THE PROSTATE (TURP);  Surgeon: Lucas Mallow, MD;  Location: [facility];  Service: Urology;  Laterality: N/A; . VENTRICULOPERITONEAL SHUNT Right 04/08/2020  Procedure: Shunt Placment - right occipital;  Surgeon: Earnie Larsson, MD;  Location: [place];  Service: Neurosurgery;  Laterality: Right; HPI: 87 year old male with history of communicating hydrocephalus status post VP shunt, hypertension, diabetes mellitus type 2, BPH, GERD, recent CVA, hyperlipidemia presented with worsening confusion and weakness and poor p.o. intake.  Patient was recently treated for otitis media by his PCP with Omnicef and prednisone for  2 weeks.  Since then patient has gradually declined with poor p.o. intake. Surgery 03/24/21 for VP shut pressure w/o improvement.  LP pending to r/o meningitis.  Subjective: Pt awake/alert Assessment / Plan / Recommendation CHL IP CLINICAL IMPRESSIONS 03/30/2021 Clinical Impression Pt presents with a severe oropharyngeal dysphagia c/b incomplete labial seal, decreased lingual strength and coordination, reduced base of tongue retraction, delayed swallow inititiation, incomplete laryngeal closure, reduced paharyngeal constriction, decreased hyloaryngeal excursion, and diminished sensation.  These deficits resulted in penetration and/or aspiration of all consistencies trialed.  With thin liquid there was signifcant anterior spillage with the majority of the bolus lost.  With the fraction that did reach the pharynx there was penetration reaching the level of the vocal folds which did not clear with spontaneous throat clear.  Pt did no follow commands for throat clear or reswallow.  With puree there was oral holding. Larger bite increased bolus awareness and allowed for oral transit.  There was lingual pumping and discoordination.  With swallow there was minimal pharyngeal clearance of bolus with a significant amount of bolus remaining in pharynx.  Pt did not follow cues  for cleansing swallow, nor were spontaneous secondary swallows observed.  Liquid was was used in an attempt to reduce residue, which still did not fully clear.  Residuals are suspected to have mixed with subsequent trials and penetrated oral cavity with possible aspiration. With nectar thick liquid there was frank, gross aspiration during the swallow. Reflexive cough/throat clear was very weak and ineffective to clear aspiration.  Pt did not follow cues to cough or reswallow.  With honey thick liquid there was silent penetration to the level of the vocal folds with subsequent aspiration.  Of note, there was retained contrast throughout esophagus on esophageal sweep following trial of HTL.  Pt may have esophagel dysmotility as well as oropharyngeal dysphagia.  Consider GI consult or esophageal evaluation as indicated.  Of note: pt appears to have possible cervical osteophytes near the UES opening, which did not prevent, but may have impaired, bolus flow into esophagus, especially with more viscous consistencies.  Pt is not safe for any oral diet at this time.  Recommend pt remain NPO with temporary alternate means of nutrition at present. Daughter was present for swallow evaluation.  Discussed results and recommendations following MBSS, role of SLP in dysphagia management, and alternate means of nutrition.  Pt has been NPO since Friday and she is concerned that not being able to eat is making him weaker. Daughter stated that PEG tube would not be desired, but was willing to consider non-surgical NG tube placement. Briefly discussed possibility of comfort feedings with known risk of aspiration. Consider Cortrak placement pending results of ongoing diagnostic testing. SLP Visit Diagnosis Dysphagia, oropharyngeal phase (R13.12) Attention and concentration deficit following -- Frontal lobe and executive function deficit following -- Impact on safety and function Severe aspiration risk;Risk for inadequate  nutrition/hydration   CHL IP TREATMENT RECOMMENDATION 03/30/2021 Treatment Recommendations Therapy as outlined in treatment plan below   Prognosis 03/30/2021 Prognosis for Safe Diet Advancement Guarded Barriers to Reach Goals Severity of deficits Barriers/Prognosis Comment -- CHL IP DIET RECOMMENDATION 03/30/2021 SLP Diet Recommendations NPO;Alternative means - temporary Liquid Administration via -- Medication Administration Via alternative means Compensations -- Postural Changes --   CHL IP OTHER RECOMMENDATIONS 03/28/2021 Recommended Consults -- Oral Care Recommendations -- Other Recommendations Have oral suction available   CHL IP FOLLOW UP RECOMMENDATIONS 03/30/2021 Follow up Recommendations (No Data)   CHL IP FREQUENCY AND DURATION  03/30/2021 Speech Therapy Frequency (ACUTE ONLY) min 1 x/week Treatment Duration 1 week      CHL IP ORAL PHASE 03/30/2021 Oral Phase Impaired Oral - Pudding Teaspoon -- Oral - Pudding Cup -- Oral - Honey Teaspoon -- Oral - Honey Cup Premature spillage;Decreased bolus cohesion;Left anterior bolus loss;Right anterior bolus loss;Incomplete tongue to palate contact;Weak lingual manipulation Oral - Nectar Teaspoon -- Oral - Nectar Cup Premature spillage;Decreased bolus cohesion;Incomplete tongue to palate contact;Weak lingual manipulation Oral - Nectar Straw -- Oral - Thin Teaspoon -- Oral - Thin Cup Premature spillage;Decreased bolus cohesion;Left anterior bolus loss;Incomplete tongue to palate contact;Weak lingual manipulation Oral - Thin Straw -- Oral - Puree Weak lingual manipulation;Lingual pumping;Incomplete tongue to palate contact;Reduced posterior propulsion;Holding of bolus;Lingual/palatal residue;Piecemeal swallowing;Delayed oral transit;Decreased bolus cohesion Oral - Mech Soft -- Oral - Regular -- Oral - Multi-Consistency -- Oral - Pill -- Oral Phase - Comment --  CHL IP PHARYNGEAL PHASE 03/30/2021 Pharyngeal Phase Impaired Pharyngeal- Pudding Teaspoon -- Pharyngeal -- Pharyngeal- Pudding  Cup -- Pharyngeal -- Pharyngeal- Honey Teaspoon -- Pharyngeal -- Pharyngeal- Honey Cup Delayed swallow initiation-pyriform sinuses;Reduced anterior laryngeal mobility;Reduced laryngeal elevation;Reduced airway/laryngeal closure;Reduced tongue base retraction;Penetration/Aspiration during swallow;Penetration/Apiration after swallow;Trace aspiration;Pharyngeal residue - valleculae;Pharyngeal residue - pyriform Pharyngeal Material enters airway, passes BELOW cords without attempt by patient to eject out (silent aspiration) Pharyngeal- Nectar Teaspoon -- Pharyngeal -- Pharyngeal- Nectar Cup Delayed swallow initiation-pyriform sinuses;Reduced pharyngeal peristalsis;Reduced anterior laryngeal mobility;Reduced laryngeal elevation;Reduced airway/laryngeal closure;Reduced tongue base retraction;Penetration/Aspiration before swallow;Significant aspiration (Amount);Pharyngeal residue - valleculae;Pharyngeal residue - pyriform Pharyngeal Material enters airway, passes BELOW cords and not ejected out despite cough attempt by patient Pharyngeal- Nectar Straw -- Pharyngeal -- Pharyngeal- Thin Teaspoon -- Pharyngeal -- Pharyngeal- Thin Cup Delayed swallow initiation-pyriform sinuses;Reduced anterior laryngeal mobility;Reduced laryngeal elevation;Reduced airway/laryngeal closure;Reduced tongue base retraction;Penetration/Aspiration during swallow;Penetration/Aspiration before swallow;Pharyngeal residue - valleculae;Pharyngeal residue - pyriform Pharyngeal Material enters airway, CONTACTS cords and not ejected out Pharyngeal- Thin Straw -- Pharyngeal -- Pharyngeal- Puree Delayed swallow initiation-vallecula;Reduced anterior laryngeal mobility;Reduced laryngeal elevation;Reduced airway/laryngeal closure;Reduced tongue base retraction;Penetration/Apiration after swallow;Pharyngeal residue - valleculae;Pharyngeal residue - pyriform;Pharyngeal residue - posterior pharnyx;Pharyngeal residue - cp segment;Inter-arytenoid space  residue;Lateral channel residue Pharyngeal Material enters airway, remains ABOVE vocal cords and not ejected out Pharyngeal- Mechanical Soft -- Pharyngeal -- Pharyngeal- Regular -- Pharyngeal -- Pharyngeal- Multi-consistency -- Pharyngeal -- Pharyngeal- Pill -- Pharyngeal -- Pharyngeal Comment --  CHL IP CERVICAL ESOPHAGEAL PHASE 03/30/2021 Cervical Esophageal Phase Impaired Pudding Teaspoon -- Pudding Cup -- Honey Teaspoon -- Honey Cup -- Nectar Teaspoon -- Nectar Cup -- Nectar Straw -- Thin Teaspoon -- Thin Cup -- Thin Straw -- Puree -- Mechanical Soft -- Regular -- Multi-consistency -- Pill -- Cervical Esophageal Comment Retention of contrast in the esophagus without backflow to pharynx Celedonio Savage, MA, CCC-SLP Acute Rehabilitation Services Office: [phone] 03/30/2021, 1:40 PM                Scheduled Meds:   . Chlorhexidine Gluconate Cloth  6 each Topical Daily  . [START ON 04/01/2021] cyanocobalamin  1,000 mcg Intramuscular Q30 days  . folic acid  1 mg Intravenous Daily  . insulin aspart  0-6 Units Subcutaneous Q4H  . lidocaine (PF)  5 mL Other Once  . thiamine injection  100 mg Intravenous Daily    Continuous Infusions:   . dextrose 5% lactated ringers 75 mL/hr at 03/31/21 0614     LOS: 4 days     Vernell Leep, MD, [place], [facility]. [facility]    To contact the  attending provider between 7A-7P or the covering provider during after hours 7P-7A, please log into the web site [email or web address] and access using universal [facility] password for that web site. If you do not have the password, please call the hospital operator.  03/31/2021, 1:27 PM

## 2021-04-01 ENCOUNTER — Inpatient Hospital Stay (HOSPITAL_COMMUNITY): Payer: Medicare PPO

## 2021-04-01 DIAGNOSIS — E119 Type 2 diabetes mellitus without complications: Secondary | ICD-10-CM | POA: Diagnosis not present

## 2021-04-01 LAB — COMPREHENSIVE METABOLIC PANEL
ALT: 40 U/L (ref 0–44)
AST: 35 U/L (ref 15–41)
Albumin: 2 g/dL — ABNORMAL LOW (ref 3.5–5.0)
Alkaline Phosphatase: 115 U/L (ref 38–126)
Anion gap: 4 — ABNORMAL LOW (ref 5–15)
BUN: 14 mg/dL (ref 8–23)
CO2: 28 mmol/L (ref 22–32)
Calcium: 9.5 mg/dL (ref 8.9–10.3)
Chloride: 107 mmol/L (ref 98–111)
Creatinine, Ser: 0.85 mg/dL (ref 0.61–1.24)
GFR, Estimated: >60 mL/min (ref >60)
Glucose, Bld: 286 mg/dL — ABNORMAL HIGH (ref 70–99)
Potassium: 3.7 mmol/L (ref 3.5–5.1)
Sodium: 139 mmol/L (ref 135–145)
Total Bilirubin: 0.5 mg/dL (ref 0.3–1.2)
Total Protein: 4.3 g/dL — ABNORMAL LOW (ref 6.5–8.1)

## 2021-04-01 LAB — CULTURE, BLOOD (ROUTINE X 2)
Culture: NO GROWTH
Culture: NO GROWTH
Special Requests: ADEQUATE
Special Requests: ADEQUATE

## 2021-04-01 LAB — GLUCOSE, CAPILLARY
Glucose-Capillary: 166 mg/dL — ABNORMAL HIGH (ref 70–99)
Glucose-Capillary: 183 mg/dL — ABNORMAL HIGH (ref 70–99)
Glucose-Capillary: 190 mg/dL — ABNORMAL HIGH (ref 70–99)
Glucose-Capillary: 209 mg/dL — ABNORMAL HIGH (ref 70–99)
Glucose-Capillary: 237 mg/dL — ABNORMAL HIGH (ref 70–99)
Glucose-Capillary: 272 mg/dL — ABNORMAL HIGH (ref 70–99)
Glucose-Capillary: 293 mg/dL — ABNORMAL HIGH (ref 70–99)

## 2021-04-01 LAB — PHOSPHORUS
Phosphorus: 1.9 mg/dL — ABNORMAL LOW (ref 2.5–4.6)
Phosphorus: 2.5 mg/dL (ref 2.5–4.6)

## 2021-04-01 LAB — MAGNESIUM
Magnesium: 1.9 mg/dL (ref 1.7–2.4)
Magnesium: 1.9 mg/dL (ref 1.7–2.4)

## 2021-04-01 MED ORDER — POTASSIUM PHOSPHATES 15 MMOLE/5ML IV SOLN
20.0000 mmol | Freq: Once | INTRAVENOUS | Status: AC
  Administered 2021-04-01: 20 mmol via INTRAVENOUS
  Filled 2021-04-01: qty 6.67

## 2021-04-01 MED ORDER — LACTATED RINGERS IV SOLN: INTRAVENOUS | Status: AC

## 2021-04-01 MED ORDER — INSULIN GLARGINE 100 UNIT/ML ~~LOC~~ SOLN
10.0000 [IU] | Freq: Every day | SUBCUTANEOUS | Status: DC
  Administered 2021-04-01 – 2021-04-04 (×4): 10 [IU] via SUBCUTANEOUS
  Filled 2021-04-01 (×4): qty 0.1

## 2021-04-01 NOTE — Evaluation (Signed)
Physical Therapy Evaluation Patient Details Name: Robert Gross MRN: [id number] DOB: 1929/03/24 Today's Date: 04/01/2021   History of Present Illness  87 yo male presents to [facility] on 5/6 with AMS x1 week (on antibiotics for UTI at home), recent ear infection with antibiotics. CT scans demonstrate evidence for left basal ganglia subacute hypodensity consistent with infarct, CSF results negative for meningitis, LP on 5/9. Additional workup for AME, UTI, elevated CSF pressure. 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  Clinical Impression   Pt presents with generalized weakness, impaired balance, max difficulty performing mobility tasks, and decreased activity tolerance. Pt to benefit from acute PT to address deficits. Pt requiring total +2 assist for bed mobility tasks at this time, with significant posterior support required in static sitting as well. Pt incontinent of stool and urine upon arrival to room, requires total pericare assist. PT recommending SNF post-acutely to address deficits, pt and family would also benefit from palliative consult. PT to progress mobility as tolerated, and will continue to follow acutely.      Follow Up Recommendations SNF;Other (comment) (recommend palliative consult)    Equipment Recommendations  None recommended by PT    Recommendations for Other Services       Precautions / Restrictions Precautions Precautions: Fall;Other (comment) Precaution Comments: cortrak, incontinence Restrictions Weight Bearing Restrictions: No      Mobility  Bed Mobility Overal bed mobility: Needs Assistance Bed Mobility: Supine to Sit;Sit to Supine;Rolling Rolling: Total assist   Supine to sit: Total assist;+2 for physical assistance;+2 for safety/equipment;HOB elevated Sit to supine: Total assist;+2 for physical  assistance;+2 for safety/equipment   General bed mobility comments: Total A x 2 to advance LEs and support trunk with posterior lean noted. Slow to minimal initiation    Transfers                 General transfer comment: deferred  Ambulation/Gait                Stairs            Wheelchair Mobility    Modified Rankin (Stroke Patients Only) Modified Rankin (Stroke Patients Only) Pre-Morbid Rankin Score: Moderate disability Modified Rankin: Severe disability     Balance Overall balance assessment: Needs assistance Sitting-balance support: Bilateral upper extremity supported;Feet supported Sitting balance-Leahy Scale: Poor Sitting balance - Comments: max A to maintain sitting balance EOB with posterior lean, minimal initiation noted to correct Postural control: Posterior lean;Left lateral lean                                   Pertinent Vitals/Pain Pain Assessment: Faces Faces Pain Scale: Hurts little more Pain Location: generalized Pain Descriptors / Indicators: Sore;Discomfort;Grimacing Pain Intervention(s): Limited activity within patient's tolerance;Monitored during session;Repositioned    Home Living Family/patient expects to be discharged to:: Private residence Living Arrangements: Spouse/significant other Available Help at Discharge: Family;Available 24 hours/day Type of Home: House Home Access: Ramped entrance     Home Layout: One level Home Equipment: Walker - 2 wheels;Walker - 4 wheels;Cane - single point;Shower seat Additional Comments: Had just received prescription for wheelchair. Had [facility] services (PT, OT, RN)    Prior Function Level of Independence: Needs assistance   Gait / Transfers Assistance Needed: In recent 2-3 weeks, pt declined to inability to ambulate. Prior to this,  pt was using RW/Rollator for mobility  ADL's / Homemaking Assistance Needed: In recent 2-3 weeks, wife has had to assist pt with bathing in bed,  all dressing tasks, feeding, etc. Prior to this, pt was able to transfer into shower, bathe self but still had assist for dressing        Hand Dominance   Dominant Hand: Right    Extremity/Trunk Assessment   Upper Extremity Assessment Upper Extremity Assessment: Defer to OT evaluation    Lower Extremity Assessment Lower Extremity Assessment: Generalized weakness RLE Sensation: history of peripheral neuropathy (bilaterally)    Cervical / Trunk Assessment Cervical / Trunk Assessment: Kyphotic  Communication   Communication: HOH  Cognition Arousal/Alertness: Awake/alert;Lethargic Behavior During Therapy: Flat affect Overall Cognitive Status: Impaired/Different from baseline Area of Impairment: Orientation;Attention;Memory;Following commands;Safety/judgement;Awareness;Problem solving                 Orientation Level: Disoriented to;Place;Situation;Time Current Attention Level: Sustained Memory: Decreased short-term memory Following Commands: Follows one step commands with increased time;Follows one step commands inconsistently Safety/Judgement: Decreased awareness of safety;Decreased awareness of deficits Awareness: Intellectual Problem Solving: Slow processing;Requires verbal cues;Decreased initiation;Difficulty sequencing;Requires tactile cues General Comments: Pt often answering "I dont know" to questions, including when asked to identify wife and daughter. Wife at bedside reports he typically is able to recall home setup answers, etc. Slow processing and decreased intiation - requires increased time for tasks. Reaching for NG tube, cues to redirect as needed      General Comments General comments (skin integrity, edema, etc.): sacral pad placed, heel pads removed due to poor fit but pt's heels floated with pillows    Exercises     Assessment/Plan    PT Assessment Patient needs continued PT services  PT Problem List Decreased strength;Decreased  mobility;Decreased activity tolerance;Decreased balance;Decreased knowledge of use of DME;Pain;Decreased cognition;Decreased knowledge of precautions;Impaired sensation       PT Treatment Interventions DME instruction;Therapeutic activities;Gait training;Therapeutic exercise;Patient/family education;Balance training;Functional mobility training;Neuromuscular re-education    PT Goals (Current goals can be found in the Care Plan section)  Acute Rehab PT Goals Patient Stated Goal: family would like to see pt able to walk again PT Goal Formulation: With family Time For Goal Achievement: 04/15/21 Potential to Achieve Goals: Fair    Frequency Min 2X/week   Barriers to discharge        Co-evaluation   Reason for Co-Treatment: Necessary to address cognition/behavior during functional activity;For patient/therapist safety;To address functional/ADL transfers   OT goals addressed during session: ADL's and self-care       AM-PAC PT "6 Clicks" Mobility  Outcome Measure Help needed turning from your back to your side while in a flat bed without using bedrails?: A Lot Help needed moving from lying on your back to sitting on the side of a flat bed without using bedrails?: Total Help needed moving to and from a bed to a chair (including a wheelchair)?: Total Help needed standing up from a chair using your arms (e.g., wheelchair or bedside chair)?: Total Help needed to walk in hospital room?: Total Help needed climbing 3-5 steps with a railing? : Total 6 Click Score: 7    End of Session   Activity Tolerance: Patient limited by fatigue Patient left: in bed;with call bell/phone within reach;with bed alarm set;with family/visitor present Nurse Communication: Mobility status PT Visit Diagnosis: Other abnormalities of gait and mobility (R26.89);Difficulty in walking, not elsewhere classified (R26.2)    Time: 7253-6644 PT Time Calculation (min) (ACUTE ONLY): 34 min  Charges:   PT  Evaluation $PT Eval Low Complexity: 1 Low          Zhanae Proffit S, PT DPT Acute Rehabilitation Services Pager [phone]  Office [phone]  Louis Matte 04/01/2021, 4:48 PM

## 2021-04-01 NOTE — Progress Notes (Signed)
  Speech Language Pathology Treatment: Dysphagia  Patient Details Name: Robert Gross MRN: [id number] DOB: Apr 01, 1929 Today's Date: 04/01/2021 Time: 1025-8527 SLP Time Calculation (min) (ACUTE ONLY): 19 min  Assessment / Plan / Recommendation Clinical Impression  Patient seen for f/u diagnostic po trials to determine readiness for pos and/or repeat swallowing function. Patient lethargic upon arrival and despite wife stating that patient was unable to arouse, patient aroused easily for clinician. Oral care provided to maximize safety with po intake. Moderate dry secretions noted along tongue and teeth due to open mouth breathing and decreased secretion management. Ice chips provided. Patient with anterior labial spillage due to oral weakness of likely at least 50% of bolus. In the meantime, increased wet vocal quality noted with limited pharyngeal swallow initiated (1 in 3 ice chips) indicative of pharyngeal secretions and possible decreased airway protection. Remains significantly deconditioned/weak despite being more alert today. Not ready for repeat testing or pos at this time. Educated spouse. Will continue to f/u.    HPI HPI: 87 year old male with history of communicating hydrocephalus status post VP shunt, hypertension, diabetes mellitus type 2, BPH, GERD, recent CVA, hyperlipidemia presented with worsening confusion and weakness and poor p.o. intake.  Patient was recently treated for otitis media by his PCP with Omnicef and prednisone for 2 weeks.  Since then patient has gradually declined with poor p.o. intake. Surgery 03/24/21 for VP shut pressure w/o improvement.  LP pending to r/o meningitis.      SLP Plan  Continue with current plan of care       Recommendations  Diet recommendations: NPO Medication Administration: Via alternative means                Oral Care Recommendations: Oral care QID Follow up Recommendations: Skilled Nursing facility SLP Visit Diagnosis: Dysphagia,  oropharyngeal phase (R13.12) Plan: Continue with current plan of care       GO             Samyra Limb MA, [place] 04/01/2021, 11:01 AM

## 2021-04-01 NOTE — Evaluation (Signed)
Occupational Therapy Evaluation Patient Details Name: Robert Gross MRN: [id number] DOB: Nov 19, 1929 Today's Date: 04/01/2021    History of Present Illness 87 yo male presents [facility] on 5/6 with AMS x1 week (on antibiotics for UTI at home), recent ear infection with antibiotics. CT scans demonstrate evidence for left basal ganglia subacute hypodensity consistent with infarct, CSF results negative for meningitis, LP on 5/9. Additional workup for AME, UTI, elevated CSF pressure. 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   Clinical Impression   PTA, pt lives with spouse and with significant functional decline in recent 2-3 weeks. Prior to decline, pt was ambulatory with RW/Rollator, able to bathe self but had assist for dressing tasks. Recently, wife has been assisting with bed baths, all dressing and feeding tasks. Pt presents to OT with deficits noted below and requires overall Total A x 2 for bed mobility. Pt with poor sitting balance, requiring Max A to maintain due to posterior lean. Pt requires Total A for UB and LB ADLs, including peri care in bed today after bowel incontinence. Pt's family at bedside and supportive. Recommend DC to SNF due to significant functional decline and high fall risk. Would also recommend consideration of palliative consult.     Follow Up Recommendations  SNF;Supervision/Assistance - 24 hour    Equipment Recommendations  Wheelchair (measurements OT);Wheelchair cushion (measurements OT);Hospital bed    Recommendations for Other Services Other (comment) (Palliative consult)     Precautions / Restrictions Precautions Precautions: Fall;Other (comment) Precaution Comments: NG tube, incontinence Restrictions Weight Bearing Restrictions: No      Mobility Bed Mobility Overal bed mobility: Needs Assistance Bed  Mobility: Supine to Sit;Sit to Supine;Rolling Rolling: Total assist   Supine to sit: Total assist;+2 for physical assistance;+2 for safety/equipment;HOB elevated Sit to supine: Total assist;+2 for physical assistance;+2 for safety/equipment   General bed mobility comments: Total A x 2 to advance LEs and support trunk with posterior lean noted. Slow to minimal initiation    Transfers                 General transfer comment: deferred    Balance Overall balance assessment: Needs assistance Sitting-balance support: Bilateral upper extremity supported;Feet supported Sitting balance-Leahy Scale: Poor Sitting balance - Comments: max A to maintain sitting balance EOB with posterior lean, minimal initiation noted to correct Postural control: Posterior lean;Left lateral lean                                 ADL either performed or assessed with clinical judgement   ADL Overall ADL's : Needs assistance/impaired Eating/Feeding: NPO                           Toileting- Clothing Manipulation and Hygiene: Total assistance;Bed level Toileting - Clothing Manipulation Details (indicate cue type and reason): Total A for cleanup after bowel incontinence       General ADL Comments: Overall Total A for all ADLs, NPO at this time     Vision Patient Visual Report: No change from baseline Vision Assessment?: No apparent visual deficits     Perception     Praxis      Pertinent Vitals/Pain Pain Assessment: Faces Faces Pain Scale: No hurt Pain Intervention(s): Monitored during session     Hand Dominance Right  Extremity/Trunk Assessment Upper Extremity Assessment Upper Extremity Assessment: Generalized weakness   Lower Extremity Assessment Lower Extremity Assessment: Defer to PT evaluation   Cervical / Trunk Assessment Cervical / Trunk Assessment: Kyphotic   Communication Communication Communication: HOH   Cognition Arousal/Alertness:  Awake/alert;Lethargic Behavior During Therapy: Flat affect Overall Cognitive Status: Impaired/Different from baseline Area of Impairment: Orientation;Attention;Memory;Following commands;Safety/judgement;Awareness;Problem solving                 Orientation Level: Disoriented to;Place;Situation;Time Current Attention Level: Sustained Memory: Decreased short-term memory Following Commands: Follows one step commands with increased time;Follows one step commands inconsistently Safety/Judgement: Decreased awareness of safety;Decreased awareness of deficits Awareness: Intellectual Problem Solving: Slow processing;Requires verbal cues;Decreased initiation;Difficulty sequencing;Requires tactile cues General Comments: Pt often answering "I dont know" to questions. Wife at bedside reports he typically is able to recall home setup answers, etc. Slow processing and decreased intiation - requires increased time for tasks. Reaching for NG tube, cues to redirect as needed   General Comments  Collab with nursing/PT to place sacral pad on bottom, placed B LE on pillows to float heels. Wife and daughter at bedside and supportive.    Exercises     Shoulder Instructions      Home Living Family/patient expects to be discharged to:: Private residence Living Arrangements: Spouse/significant other Available Help at Discharge: Family;Available 24 hours/day Type of Home: House Home Access: Ramped entrance     Home Layout: One level     Bathroom Shower/Tub: Walk-in shower;Door   Bathroom Toilet: Handicapped height (BSC placed over toilet)     Home Equipment: Walker - 2 wheels;Walker - 4 wheels;Cane - single point;Shower seat   Additional Comments: Had just received prescription for wheelchair. Had [facility] services (PT, OT, RN)      Prior Functioning/Environment Level of Independence: Needs assistance  Gait / Transfers Assistance Needed: In recent 2-3 weeks, pt declined to inability to ambulate.  Prior to this, pt was using RW/Rollator for mobility ADL's / Homemaking Assistance Needed: In recent 2-3 weeks, wife has had to assist pt with bathing in bed, all dressing tasks, feeding, etc. Prior to this, pt was able to transfer into shower, bathe self but still had assist for dressing Communication / Swallowing Assistance Needed: swallowing difficulties leading up to admission          OT Problem List: Decreased strength;Decreased activity tolerance;Impaired balance (sitting and/or standing);Decreased cognition;Decreased safety awareness;Decreased knowledge of use of DME or AE;Decreased knowledge of precautions      OT Treatment/Interventions: Self-care/ADL training;Therapeutic exercise;Energy conservation;DME and/or AE instruction;Therapeutic activities;Patient/family education;Balance training    OT Goals(Current goals can be found in the care plan section) Acute Rehab OT Goals Patient Stated Goal: family would like to see pt able to walk again OT Goal Formulation: With family Time For Goal Achievement: 04/15/21 Potential to Achieve Goals: Fair ADL Goals Pt Will Perform Grooming: with min assist;sitting;bed level Pt Will Perform Upper Body Bathing: with mod assist;sitting;bed level Pt Will Transfer to Toilet: with mod assist;stand pivot transfer;bedside commode Additional ADL Goal #1: Pt to demonstrate ability to maintain sitting balance EOB > 3 minutes with no more than min guard during functional tasks  OT Frequency: Min 2X/week   Barriers to D/C:            Co-evaluation PT/OT/SLP Co-Evaluation/Treatment: Yes Reason for Co-Treatment: Necessary to address cognition/behavior during functional activity;For patient/therapist safety;To address functional/ADL transfers   OT goals addressed during session: ADL's and self-care      AM-PAC OT "6  Clicks" Daily Activity     Outcome Measure Help from another person eating meals?: Total (NPO) Help from another person taking care  of personal grooming?: Total Help from another person toileting, which includes using toliet, bedpan, or urinal?: Total Help from another person bathing (including washing, rinsing, drying)?: Total Help from another person to put on and taking off regular upper body clothing?: Total Help from another person to put on and taking off regular lower body clothing?: Total 6 Click Score: 6   End of Session Nurse Communication: Mobility status  Activity Tolerance: Patient limited by fatigue Patient left: in bed;with call bell/phone within reach;with bed alarm set;with family/visitor present  OT Visit Diagnosis: Unsteadiness on feet (R26.81);Other abnormalities of gait and mobility (R26.89);Muscle weakness (generalized) (M62.81);Other symptoms and signs involving cognitive function                Time: 5852-7782 OT Time Calculation (min): 33 min Charges:  OT General Charges $OT Visit: 1 Visit OT Evaluation $OT Eval Moderate Complexity: 1 Mod  Malachy Chamber, OTR/L Acute Rehab Services Office: [phone]  Layla Maw 04/01/2021, 2:05 PM

## 2021-04-01 NOTE — Progress Notes (Signed)
MR exam finished. Called Dr. Marchelle Folks office about re-check on pt shunt. Dr. Annette Stable or PA will come over this afternoon to check implant.

## 2021-04-01 NOTE — Progress Notes (Signed)
Patient underwent MRI scan of the brain today.  This demonstrates continued presence of a left basal ganglia ischemic infarct without any new findings otherwise.  He continues to have mild to moderate ventriculomegaly without evidence of periventricular edema.  His small subdural fluid collections are unchanged.  I have been asked to reprogram his shunt after the MRI scan and the subsequent exposure to magnetic fields.  His shunt was interrogated and reprogrammed to level 0.5 to promote maximal drainage of CSF in hopes that this may improve his situation and particularly given his nonambulatory state and I think this is the best pressure level for him.

## 2021-04-01 NOTE — Progress Notes (Signed)
PROGRESS NOTE   Robert Gross  [id number]    DOB: 09/20/1929    DOA: 03/28/2021  PCP: [name], MD    Chief Complaint  Patient presents with  . Altered Mental Status    Brief Narrative:  87 year old male with medical history significant for but not limited to communicating hydrocephalus s/p VP shunt, family reported dementia, hypertension, DM2, BPH, GERD, recent CVA, hyperlipidemia, presented with worsening confusion, weakness and poor oral intake.  He was reportedly doing well about 6 weeks ago, then sometime in March he was diagnosed with an ear infection and started on antibiotics and 2 weeks of prednisone.  During the steroid course, he had hypertension and hyperglycemia up to the 200s.  Once the prednisone was stopped, there was a sharp decline in his health and progressive weakness.  CT head for shunt follow-up 4/5 showed new subacute infarct.  Week prior to admission, he was also diagnosed with yeast UTI and started on fluconazole.  Seen by neurosurgery and VP shunt readjusted on 03/24/2021.  He progressively worsened, unable to get out of bed, difficulty eating.  Admitted initially for acute metabolic encephalopathy, hypernatremia, hypercalcemia, dehydration, suspected UTI.  He had complained of neck stiffness for 2 weeks at home, due to concern for CNS infection, empiric meningitis coverage was started and patient was transferred from [facility] to [facility] for further evaluation and management.  No acyclovir started due to low suspicion for herpes encephalitis.  Neurology and neurosurgery were consulted.  Patient underwent LP 5/9.  Ongoing AMS with unsafe oral intake.  Family opted for [facility] tube placement and tube feeds.   Assessment & Plan:   Acute metabolic encephalopathy Decline in mental status started approximately 6 weeks PTA with a more rapid decline over the past 2 weeks PTA when he became nonverbal and increasingly lethargic.  Over the past 6 weeks, he was treated  for an ear infection with cefdinir and a 12-day course of prednisone, UTI with Cipro and fluconazole and his VP shunt was readjusted by neurosurgery.   Patient underwent CT scan of the head which showed mildly dilated lateral ventricles.  No acute findings noted.  EEG did not show any epileptiform activity.  Vitamin B12 level was 356.  B1 level is pending.  TSH 4.6.  Free T4 0.97.  Ammonia 28.  Urine culture without growth.  Blood cultures negative.  CSF work-up was unremarkable.  Antibiotics were discontinued. Patient remains on IV thiamine.  MRI has been ordered by neurology and is pending.  Depending on MRI may need to [facility] neurosurgery to consider lowering the shunt valve pressure. PT and OT evaluation.  Folic acid deficiency Folate level noted to be 4.0.  Started on supplementation.  Neck rigidity/possible torticollis Evaluation negative for meningitis.  Symptomatic treatment with heating pads, topical menthol and Tylenol.  Avoid opiates or sedatives.   History of communicating hydrocephalus s/p VP shunt Neurosurgery had been following.  As per neurosurgery, he developed bilateral subdural fluid collections necessitating adjustment of his shunt valve pressure higher.  His subdural collections essentially resolved but he continues to have mild ventriculomegaly.  Shunt appears to be operating well otherwise.  AKI, mild: Resolved.  Lisinopril on hold.  Follow BMP.  Dehydration with hypernatremia: Serum sodium 150 on admission, likely related to poor oral intake.  Resolved.  Hypercalcemia Noted to have serum calcium of 11.5.  Resolved.    Hypokalemia/hypomagnesemia/hypophosphatemia Potassium was repleted.  Magnesium 2.0.  Will give additional dose of potassium phosphate.  Dysphagia  Unsafe oral intake due to AMS.  Speech therapy recommends NPO and alternate means of nutrition.  Previous rounding MD had a long discussion with patient's daughter on 5/9 and the next daughter who is a  pharmacist on 5/10, advised alternative nutrition options, risks and benefits, including temporary Panda tube feeds versus PEG, continued IVF for few more days to see if mental status improves and he can swallow safely.  Family opted for Panda tube feeds.  Type II DM Monitor CBGs and SSI as needed.  Metformin on hold.  CBGs noted to be elevated.  Likely due to tube feedings.  Will add coverage.  Questionable CVA Per CT head 03/16/2021: Stable appearance of hypodensity seen in the left anterior basal ganglia consistent with subacute infarction.  MRI brain ordered by neurology.  Essential hypertension Lisinopril on hold.  Blood pressure is noted to be elevated occasionally.  He uses as needed agents.  Thrombocytopenia: Unclear etiology.?  Related to meds.  Resolved.  Adult failure to thrive Likely multifactorial.  Monitor closely.  If no significant and sustained improvement, may need to consider palliative care consultation to address goals of care.  PT and OT evaluation.  Urinary retention Had indwelling Foley catheter which was removed.  Monitor for retention.   DVT prophylaxis: Place and maintain sequential compression device Start: 03/29/21 0749     Code Status: DNR Family Communication: We will update daughter later today. Disposition: To be determined.  Status is: Inpatient  Remains inpatient appropriate because:Inpatient level of care appropriate due to severity of illness   Dispo: The patient is from: Home              Anticipated d/c is to: TBD              Patient currently is not medically stable to d/c.   Difficult to place patient No        Consultants:   Neurosurgery Neurology  Procedures:   LP by neurology 5/9.  Antimicrobials:    Anti-infectives (From admission, onward)   Start     Dose/Rate Route Frequency Ordered Stop   03/28/21 2200  cefTRIAXone (ROCEPHIN) 2 g in sodium chloride 0.9 % 100 mL IVPB  Status:  Discontinued        2 g 200 mL/hr over  30 Minutes Intravenous Every 12 hours 03/28/21 1244 03/31/21 0732   03/28/21 1800  vancomycin (VANCOREADY) IVPB 1000 mg/200 mL  Status:  Discontinued        1,000 mg 200 mL/hr over 60 Minutes Intravenous Every 24 hours 03/28/21 0006 03/28/21 0757   03/28/21 1600  vancomycin (VANCOREADY) IVPB 500 mg/100 mL  Status:  Discontinued        500 mg 100 mL/hr over 60 Minutes Intravenous Every 12 hours 03/28/21 1457 03/31/21 0732   03/28/21 1400  fluconazole (DIFLUCAN) IVPB 100 mg  Status:  Discontinued        100 mg 50 mL/hr over 60 Minutes Intravenous Every 24 hours 03/28/21 1239 03/30/21 1639   03/28/21 1330  vancomycin (VANCOREADY) IVPB 1000 mg/200 mL  Status:  Discontinued        1,000 mg 200 mL/hr over 60 Minutes Intravenous  Once 03/28/21 1244 03/28/21 1259   03/28/21 1300  ampicillin (OMNIPEN) 2 g in sodium chloride 0.9 % 100 mL IVPB  Status:  Discontinued        2 g 300 mL/hr over 20 Minutes Intravenous Every 6 hours 03/28/21 1244 03/31/21 0732   03/28/21 1000  fluconazole (DIFLUCAN)  tablet 100 mg  Status:  Discontinued        100 mg Oral Daily 03/24/2021 2341 03/28/21 1239   03/28/21 0800  ceFEPIme (MAXIPIME) 2 g in sodium chloride 0.9 % 100 mL IVPB  Status:  Discontinued        2 g 200 mL/hr over 30 Minutes Intravenous Every 12 hours 03/28/21 0006 03/28/21 1244   03/28/21 0400  metroNIDAZOLE (FLAGYL) IVPB 500 mg  Status:  Discontinued        500 mg 100 mL/hr over 60 Minutes Intravenous Every 8 hours 04/19/2021 2346 03/28/21 0757   04/06/2021 1815  metroNIDAZOLE (FLAGYL) IVPB 500 mg        500 mg 100 mL/hr over 60 Minutes Intravenous  Once 04/15/2021 1805 04/20/2021 2049   03/25/2021 1815  ceFEPIme (MAXIPIME) 2 g in sodium chloride 0.9 % 100 mL IVPB        2 g 200 mL/hr over 30 Minutes Intravenous  Once 04/21/2021 1808 04/20/2021 2049   04/10/2021 1800  vancomycin (VANCOCIN) IVPB 1000 mg/200 mL premix        1,000 mg 200 mL/hr over 60 Minutes Intravenous  Once 04/12/2021 1752 04/02/2021 1951   04/09/2021  1515  cefTRIAXone (ROCEPHIN) 1 g in sodium chloride 0.9 % 100 mL IVPB  Status:  Discontinued        1 g 200 mL/hr over 30 Minutes Intravenous Every 24 hours 03/26/2021 1513 04/12/2021 1808        Subjective:  Patient with eyes open.  Answers a few questions but noted to be confused.  Does not appear to be in any discomfort.    Objective:   Vitals:   04/01/21 0024 04/01/21 0416 04/01/21 0600 04/01/21 0814  BP: (!) 151/66 (!) 154/100  (!) 179/76  Pulse: 71 75  79  Resp: 19 16  18   Temp: (!) 97.5 F (36.4 C) 98.6 F (37 C)  (!) 97.4 F (36.3 C)  TempSrc: Oral Axillary  Oral  SpO2: 97% 99%  99%  Weight: 68.3 kg  61.2 kg   Height:        General appearance: Awake alert.  In no distress.  Distracted Resp: Clear to auscultation bilaterally.  Normal effort Cardio: S1-S2 is normal regular.  No S3-S4.  No rubs murmurs or bruit GI: Abdomen is soft.  Nontender nondistended.  Bowel sounds are present normal.  No masses organomegaly Extremities: No edema.  Noted to be moving all of his extremities  No focal neurological deficits.       Data Reviewed:   I have personally reviewed following labs and imaging studies   CBC: Recent Labs  Lab 03/22/2021 1406 03/28/21 0349 03/29/21 0536 03/30/21 1659  WBC 14.7* 13.5* 11.7* 9.6  NEUTROABS 12.2*  --  8.3*  --   HGB 15.8 13.2 13.3 13.6  HCT 48.1 40.7 39.6 40.4  MCV 99.0 101.2* 97.8 96.4  PLT 184 143* 134* [id number]    Basic Metabolic Panel: Recent Labs  Lab 03/30/21 1659 03/31/21 0358 03/31/21 1401 03/31/21 1854 04/01/21 0447  NA 142 141  --   --  139  K 3.0* 3.6  --   --  3.7  CL 112* 111  --   --  107  CO2 24 25  --   --  28  GLUCOSE 152* 172*  --   --  286*  BUN 13 10  --   --  14  CREATININE 0.62 0.58*  --   --  0.85  CALCIUM 9.6 9.6  --   --  9.5  MG  --  1.6* 2.0 2.0 1.9  PHOS  --   --  1.6* 1.5* 1.9*    Liver Function Tests: Recent Labs  Lab 04/02/2021 1406 03/29/21 0633 04/01/21 0447  AST 16 14* 35  ALT 14 13 40   ALKPHOS 134* 97 115  BILITOT 0.5 0.3 0.5  PROT 6.0* 4.5* 4.3*  ALBUMIN 2.6* 2.0* 2.0*    CBG: Recent Labs  Lab 04/01/21 0025 04/01/21 0415 04/01/21 0739  GLUCAP 272* 293* 237*    Microbiology Studies:   Recent Results (from the past 240 hour(s))  Culture, blood (Routine x 2)     Status: None   Collection Time: 04/19/2021  2:06 PM   Specimen: BLOOD  Result Value Ref Range Status   Specimen Description   Final    BLOOD LEFT ANTECUBITAL Performed at [facility], [address], [place], [place] [address]    Special Requests   Final    BOTTLES DRAWN AEROBIC AND ANAEROBIC Blood Culture adequate volume   Culture   Final    NO GROWTH 5 DAYS Performed at [facility], [address], [place], [place] [address]    Report Status 04/01/2021 FINAL  Final  Culture, blood (Routine x 2)     Status: None   Collection Time: 03/26/2021  2:12 PM   Specimen: Site Not Specified; Blood  Result Value Ref Range Status   Specimen Description   Final    SITE NOT SPECIFIED Performed at [facility], [address], [place], [place] [address]    Special Requests   Final    BOTTLES DRAWN AEROBIC AND ANAEROBIC Blood Culture adequate volume   Culture   Final    NO GROWTH 5 DAYS Performed at [facility], [address], [place], [place] [address]    Report Status 04/01/2021 FINAL  Final  Urine culture     Status: None   Collection Time: 04/10/2021  2:14 PM   Specimen: Urine, Random  Result Value Ref Range Status   Specimen Description   Final    URINE, RANDOM Performed at [facility], [address], [place], [place] [address]    Special Requests   Final    NONE Performed at [facility], [address], [place], [place] [address]    Culture   Final    NO GROWTH Performed at [facility], [address], [place], [place] [address]    Report Status 03/28/2021 FINAL  Final  Resp Panel by RT-PCR  (Flu A&B, Covid) Nasopharyngeal Swab     Status: None   Collection Time: 03/25/2021  3:15 PM   Specimen: Nasopharyngeal Swab; Nasopharyngeal(NP) swabs in vial transport medium  Result Value Ref Range Status   SARS Coronavirus 2 by RT PCR NEGATIVE NEGATIVE Final    Comment: (NOTE) SARS-CoV-2 target nucleic acids are NOT DETECTED.  The SARS-CoV-2 RNA is generally detectable in upper respiratory specimens during the acute phase of infection. The lowest concentration of SARS-CoV-2 viral copies this assay can detect is 138 copies/mL. A negative result does not preclude SARS-Cov-2 infection and should not be used as the sole basis for treatment or other patient management decisions. A negative result may occur with  improper specimen collection/handling, submission of specimen other than nasopharyngeal swab, presence of viral mutation(s) within the areas targeted by this assay, and inadequate number of viral  copies(<138 copies/mL). A negative result must be combined with clinical observations, patient history, and epidemiological information. The expected result is Negative.  Fact Sheet for Patients:  EntrepreneurPulse.com.au  Fact Sheet for Healthcare Providers:  IncredibleEmployment.be  This test is no t yet approved or cleared by the [place] FDA and  has been authorized for detection and/or diagnosis of SARS-CoV-2 by FDA under an Emergency Use Authorization (EUA). This EUA will remain  in effect (meaning this test can be used) for the duration of the COVID-19 declaration under Section 564(b)(1) of the Act, 21 U.S.C.section 360bbb-3(b)(1), unless the authorization is terminated  or revoked sooner.       Influenza A by PCR NEGATIVE NEGATIVE Final   Influenza B by PCR NEGATIVE NEGATIVE Final    Comment: (NOTE) The Xpert Xpress SARS-CoV-2/FLU/RSV plus assay is intended as an aid in the diagnosis of influenza from Nasopharyngeal swab specimens  and should not be used as a sole basis for treatment. Nasal washings and aspirates are unacceptable for Xpert Xpress SARS-CoV-2/FLU/RSV testing.  Fact Sheet for Patients: EntrepreneurPulse.com.au  Fact Sheet for Healthcare Providers: IncredibleEmployment.be  This test is not yet approved or cleared by the [place] FDA and has been authorized for detection and/or diagnosis of SARS-CoV-2 by FDA under an Emergency Use Authorization (EUA). This EUA will remain in effect (meaning this test can be used) for the duration of the COVID-19 declaration under Section 564(b)(1) of the Act, 21 U.S.C. section 360bbb-3(b)(1), unless the authorization is terminated or revoked.  Performed at [facility], [place] [address] [place], [place] [address]   CSF culture w Stat Gram Stain     Status: None (Preliminary result)   Collection Time: 03/30/21  1:48 PM   Specimen: CSF; Cerebrospinal Fluid  Result Value Ref Range Status   Specimen Description CSF  Final   Special Requests NONE  Final   Gram Stain NO WBC SEEN NO ORGANISMS SEEN CYTOSPIN SMEAR   Final   Culture   Final    NO GROWTH 2 DAYS Performed at [facility], [place] [address] [place], [place] [address]    Report Status PENDING  Incomplete     Radiology Studies:  DG Loyce Dys Tube Plc W/Fl W/Rad  Result Date: 03/31/2021 CLINICAL DATA:  Feeding tube placement EXAM: NASO G TUBE PLACEMENT WITH FL AND WITH RAD FLUOROSCOPY TIME:  Fluoroscopy Time:  3 minutes 24 seconds Radiation Exposure Index (if provided by the fluoroscopic device): 43 mGy Number of Acquired Spot Images: 0 COMPARISON:  None. FINDINGS: Feeding tube was placed under fluoroscopic guidance. The tip was advanced into the descending duodenum. IMPRESSION: Transpyloric feeding tube placed with the tip in the descending duodenum. Electronically Signed   By: Rolm Baptise M.D.   On: 03/31/2021 16:47   DG Swallowing  Func-Speech Pathology  Result Date: 03/30/2021 Objective Swallowing Evaluation: Type of Study: MBS-Modified Barium Swallow Study  Patient Details Name: JUWAN BENENHALEY MRN: [id number] Date of Birth: Jun 20, 1929 Today's Date: 03/30/2021 Time: SLP Start Time (ACUTE ONLY): 1226 -SLP Stop Time (ACUTE ONLY): 1250 SLP Time Calculation (min) (ACUTE ONLY): 24 min Past Medical History: Past Medical History: Diagnosis Date . Arthritis  . BPH (benign prostatic hyperplasia)  . BPH with urinary obstruction  . Cataracts, bilateral  . Dizzy spells   residual from concussion 09-05-2017 intermittantly when turns head but stated as of 09-26-2017 no issues for past 2-3 days . Essential hypertension  . Gait abnormality  . GERD (gastroesophageal reflux disease)  .  History of concussion   09-05-2017  w/ loc --- per pt residual intermittant dizziness when he turns his head either way . History of prostatitis  . History of sepsis   09-12-2017  urosepsis . History of urinary retention  . Hydrocephalus ([place])  . Hypercalcemia  . Hyperlipemia  . Memory loss  . Metabolic encephalopathy  . Osteopenia  . Squamous cell carcinoma in situ   multiple spots . Type 2 diabetes mellitus ([place])  . Urinary retention   requires intermittent caths . Vitamin D deficiency  . Vocal cord paralysis  . Wears partial dentures   upper Past Surgical History: Past Surgical History: Procedure Laterality Date . CATARACT EXTRACTION W/ INTRAOCULAR LENS  IMPLANT, BILATERAL  03/2017 . EXICISION EPIDERMAL CYST LEFT THUMB  09-12-2001   dr sypher  [facility] . INGUINAL HERNIA REPAIR Right 11-07-2007   dr Dalbert Batman [facility] . KNEE ARTHROSCOPY Right 1990s . MIDDLE EAR SURGERY    cyst removal . SQUAMOUS CELL CARCINOMA EXCISION   . TRANSURETHRAL RESECTION OF PROSTATE  07-26-2000  dr Amalia Hailey [facility] . TRANSURETHRAL RESECTION OF PROSTATE N/A 09/28/2017  Procedure: BIPOLAR TRANSURETHRAL RESECTION OF THE PROSTATE (TURP);  Surgeon: Lucas Mallow, MD;  Location: [facility];  Service:  Urology;  Laterality: N/A; . VENTRICULOPERITONEAL SHUNT Right 04/08/2020  Procedure: Shunt Placment - right occipital;  Surgeon: Earnie Larsson, MD;  Location: [place];  Service: Neurosurgery;  Laterality: Right; HPI: 87 year old male with history of communicating hydrocephalus status post VP shunt, hypertension, diabetes mellitus type 2, BPH, GERD, recent CVA, hyperlipidemia presented with worsening confusion and weakness and poor p.o. intake.  Patient was recently treated for otitis media by his PCP with Omnicef and prednisone for 2 weeks.  Since then patient has gradually declined with poor p.o. intake. Surgery 03/24/21 for VP shut pressure w/o improvement.  LP pending to r/o meningitis.  Subjective: Pt awake/alert Assessment / Plan / Recommendation CHL IP CLINICAL IMPRESSIONS 03/30/2021 Clinical Impression Pt presents with a severe oropharyngeal dysphagia c/b incomplete labial seal, decreased lingual strength and coordination, reduced base of tongue retraction, delayed swallow inititiation, incomplete laryngeal closure, reduced paharyngeal constriction, decreased hyloaryngeal excursion, and diminished sensation.  These deficits resulted in penetration and/or aspiration of all consistencies trialed.  With thin liquid there was signifcant anterior spillage with the majority of the bolus lost.  With the fraction that did reach the pharynx there was penetration reaching the level of the vocal folds which did not clear with spontaneous throat clear.  Pt did no follow commands for throat clear or reswallow.  With puree there was oral holding. Larger bite increased bolus awareness and allowed for oral transit.  There was lingual pumping and discoordination.  With swallow there was minimal pharyngeal clearance of bolus with a significant amount of bolus remaining in pharynx.  Pt did not follow cues for cleansing swallow, nor were spontaneous secondary swallows observed.  Liquid was was used in an attempt to reduce residue, which  still did not fully clear.  Residuals are suspected to have mixed with subsequent trials and penetrated oral cavity with possible aspiration. With nectar thick liquid there was frank, gross aspiration during the swallow. Reflexive cough/throat clear was very weak and ineffective to clear aspiration.  Pt did not follow cues to cough or reswallow.  With honey thick liquid there was silent penetration to the level of the vocal folds with subsequent aspiration.  Of note, there was retained contrast throughout esophagus on esophageal sweep following trial of HTL.  Pt may have esophagel dysmotility as well as oropharyngeal dysphagia.  Consider GI consult or esophageal evaluation as indicated.  Of note: pt appears to have possible cervical osteophytes near the UES opening, which did not prevent, but may have impaired, bolus flow into esophagus, especially with more viscous consistencies.  Pt is not safe for any oral diet at this time.  Recommend pt remain NPO with temporary alternate means of nutrition at present. Daughter was present for swallow evaluation.  Discussed results and recommendations following MBSS, role of SLP in dysphagia management, and alternate means of nutrition.  Pt has been NPO since Friday and she is concerned that not being able to eat is making him weaker. Daughter stated that PEG tube would not be desired, but was willing to consider non-surgical NG tube placement. Briefly discussed possibility of comfort feedings with known risk of aspiration. Consider Cortrak placement pending results of ongoing diagnostic testing. SLP Visit Diagnosis Dysphagia, oropharyngeal phase (R13.12) Attention and concentration deficit following -- Frontal lobe and executive function deficit following -- Impact on safety and function Severe aspiration risk;Risk for inadequate nutrition/hydration   CHL IP TREATMENT RECOMMENDATION 03/30/2021 Treatment Recommendations Therapy as outlined in treatment plan below   Prognosis  03/30/2021 Prognosis for Safe Diet Advancement Guarded Barriers to Reach Goals Severity of deficits Barriers/Prognosis Comment -- CHL IP DIET RECOMMENDATION 03/30/2021 SLP Diet Recommendations NPO;Alternative means - temporary Liquid Administration via -- Medication Administration Via alternative means Compensations -- Postural Changes --   CHL IP OTHER RECOMMENDATIONS 03/28/2021 Recommended Consults -- Oral Care Recommendations -- Other Recommendations Have oral suction available   CHL IP FOLLOW UP RECOMMENDATIONS 03/30/2021 Follow up Recommendations (No Data)   CHL IP FREQUENCY AND DURATION 03/30/2021 Speech Therapy Frequency (ACUTE ONLY) min 1 x/week Treatment Duration 1 week      CHL IP ORAL PHASE 03/30/2021 Oral Phase Impaired Oral - Pudding Teaspoon -- Oral - Pudding Cup -- Oral - Honey Teaspoon -- Oral - Honey Cup Premature spillage;Decreased bolus cohesion;Left anterior bolus loss;Right anterior bolus loss;Incomplete tongue to palate contact;Weak lingual manipulation Oral - Nectar Teaspoon -- Oral - Nectar Cup Premature spillage;Decreased bolus cohesion;Incomplete tongue to palate contact;Weak lingual manipulation Oral - Nectar Straw -- Oral - Thin Teaspoon -- Oral - Thin Cup Premature spillage;Decreased bolus cohesion;Left anterior bolus loss;Incomplete tongue to palate contact;Weak lingual manipulation Oral - Thin Straw -- Oral - Puree Weak lingual manipulation;Lingual pumping;Incomplete tongue to palate contact;Reduced posterior propulsion;Holding of bolus;Lingual/palatal residue;Piecemeal swallowing;Delayed oral transit;Decreased bolus cohesion Oral - Mech Soft -- Oral - Regular -- Oral - Multi-Consistency -- Oral - Pill -- Oral Phase - Comment --  CHL IP PHARYNGEAL PHASE 03/30/2021 Pharyngeal Phase Impaired Pharyngeal- Pudding Teaspoon -- Pharyngeal -- Pharyngeal- Pudding Cup -- Pharyngeal -- Pharyngeal- Honey Teaspoon -- Pharyngeal -- Pharyngeal- Honey Cup Delayed swallow initiation-pyriform sinuses;Reduced anterior  laryngeal mobility;Reduced laryngeal elevation;Reduced airway/laryngeal closure;Reduced tongue base retraction;Penetration/Aspiration during swallow;Penetration/Apiration after swallow;Trace aspiration;Pharyngeal residue - valleculae;Pharyngeal residue - pyriform Pharyngeal Material enters airway, passes BELOW cords without attempt by patient to eject out (silent aspiration) Pharyngeal- Nectar Teaspoon -- Pharyngeal -- Pharyngeal- Nectar Cup Delayed swallow initiation-pyriform sinuses;Reduced pharyngeal peristalsis;Reduced anterior laryngeal mobility;Reduced laryngeal elevation;Reduced airway/laryngeal closure;Reduced tongue base retraction;Penetration/Aspiration before swallow;Significant aspiration (Amount);Pharyngeal residue - valleculae;Pharyngeal residue - pyriform Pharyngeal Material enters airway, passes BELOW cords and not ejected out despite cough attempt by patient Pharyngeal- Nectar Straw -- Pharyngeal -- Pharyngeal- Thin Teaspoon -- Pharyngeal -- Pharyngeal- Thin Cup Delayed swallow initiation-pyriform sinuses;Reduced anterior laryngeal mobility;Reduced laryngeal elevation;Reduced airway/laryngeal closure;Reduced tongue base retraction;Penetration/Aspiration  during swallow;Penetration/Aspiration before swallow;Pharyngeal residue - valleculae;Pharyngeal residue - pyriform Pharyngeal Material enters airway, CONTACTS cords and not ejected out Pharyngeal- Thin Straw -- Pharyngeal -- Pharyngeal- Puree Delayed swallow initiation-vallecula;Reduced anterior laryngeal mobility;Reduced laryngeal elevation;Reduced airway/laryngeal closure;Reduced tongue base retraction;Penetration/Apiration after swallow;Pharyngeal residue - valleculae;Pharyngeal residue - pyriform;Pharyngeal residue - posterior pharnyx;Pharyngeal residue - cp segment;Inter-arytenoid space residue;Lateral channel residue Pharyngeal Material enters airway, remains ABOVE vocal cords and not ejected out Pharyngeal- Mechanical Soft -- Pharyngeal --  Pharyngeal- Regular -- Pharyngeal -- Pharyngeal- Multi-consistency -- Pharyngeal -- Pharyngeal- Pill -- Pharyngeal -- Pharyngeal Comment --  CHL IP CERVICAL ESOPHAGEAL PHASE 03/30/2021 Cervical Esophageal Phase Impaired Pudding Teaspoon -- Pudding Cup -- Honey Teaspoon -- Honey Cup -- Nectar Teaspoon -- Nectar Cup -- Nectar Straw -- Thin Teaspoon -- Thin Cup -- Thin Straw -- Puree -- Mechanical Soft -- Regular -- Multi-consistency -- Pill -- Cervical Esophageal Comment Retention of contrast in the esophagus without backflow to pharynx Celedonio Savage, MA, CCC-SLP Acute Rehabilitation Services Office: [phone] 03/30/2021, 1:40 PM                Scheduled Meds:   . Chlorhexidine Gluconate Cloth  6 each Topical Daily  . cyanocobalamin  1,000 mcg Intramuscular Q30 days  . feeding supplement (PROSource TF)  45 mL Per Tube Daily  . folic acid  1 mg Intravenous Daily  . insulin aspart  0-6 Units Subcutaneous Q4H  . thiamine injection  100 mg Intravenous Daily    Continuous Infusions:   . feeding supplement (OSMOLITE 1.5 CAL) 1,000 mL (03/31/21 1700)  . lactated ringers 75 mL/hr at 04/01/21 0453     LOS: 5 days     Bonnielee Haff, [facility]    To contact the attending provider between 7A-7P or the covering provider during after hours 7P-7A, please log into the web site [email or web address] and access using universal [place] password for that web site. If you do not have the password, please call the hospital operator.  04/01/2021, 10:12 AM

## 2021-04-02 DIAGNOSIS — R1310 Dysphagia, unspecified: Secondary | ICD-10-CM | POA: Diagnosis not present

## 2021-04-02 DIAGNOSIS — Z7189 Other specified counseling: Secondary | ICD-10-CM

## 2021-04-02 DIAGNOSIS — Z515 Encounter for palliative care: Secondary | ICD-10-CM

## 2021-04-02 DIAGNOSIS — G9341 Metabolic encephalopathy: Secondary | ICD-10-CM | POA: Diagnosis not present

## 2021-04-02 LAB — GLUCOSE, CAPILLARY
Glucose-Capillary: 159 mg/dL — ABNORMAL HIGH (ref 70–99)
Glucose-Capillary: 167 mg/dL — ABNORMAL HIGH (ref 70–99)
Glucose-Capillary: 191 mg/dL — ABNORMAL HIGH (ref 70–99)
Glucose-Capillary: 195 mg/dL — ABNORMAL HIGH (ref 70–99)
Glucose-Capillary: 197 mg/dL — ABNORMAL HIGH (ref 70–99)
Glucose-Capillary: 206 mg/dL — ABNORMAL HIGH (ref 70–99)

## 2021-04-02 LAB — BASIC METABOLIC PANEL
Anion gap: 3 — ABNORMAL LOW (ref 5–15)
BUN: 13 mg/dL (ref 8–23)
CO2: 30 mmol/L (ref 22–32)
Calcium: 9.1 mg/dL (ref 8.9–10.3)
Chloride: 104 mmol/L (ref 98–111)
Creatinine, Ser: 0.68 mg/dL (ref 0.61–1.24)
GFR, Estimated: >60 mL/min (ref >60)
Glucose, Bld: 184 mg/dL — ABNORMAL HIGH (ref 70–99)
Potassium: 4 mmol/L (ref 3.5–5.1)
Sodium: 137 mmol/L (ref 135–145)

## 2021-04-02 LAB — CSF CULTURE W GRAM STAIN
Culture: NO GROWTH
Gram Stain: NONE SEEN

## 2021-04-02 LAB — VITAMIN B1: Vitamin B1 (Thiamine): 97.6 nmol/L (ref 66.5–200.0)

## 2021-04-02 LAB — MAGNESIUM: Magnesium: 1.9 mg/dL (ref 1.7–2.4)

## 2021-04-02 NOTE — Care Management Important Message (Signed)
Important Message  Patient Details  Name: Robert Gross MRN: [id number] Date of Birth: 1929-04-19   Medicare Important Message Given:  Yes     [name] Montine Circle 04/02/2021, 3:04 PM

## 2021-04-02 NOTE — Progress Notes (Signed)
PROGRESS NOTE   Robert Gross  WNI:[id number]    DOB: 03-06-1929    DOA: 04/08/21  PCP: [name], MD    Chief Complaint  Patient presents with  . Altered Mental Status    Brief Narrative:  87 year old male with medical history significant for but not limited to communicating hydrocephalus s/p VP shunt, family reported dementia, hypertension, DM2, BPH, GERD, recent CVA, hyperlipidemia, presented with worsening confusion, weakness and poor oral intake.  He was reportedly doing well about 6 weeks ago, then sometime in March he was diagnosed with an ear infection and started on antibiotics and 2 weeks of prednisone.  During the steroid course, he had hypertension and hyperglycemia up to the 200s.  Once the prednisone was stopped, there was a sharp decline in his health and progressive weakness.  CT head for shunt follow-up 4/5 showed new subacute infarct.  Week prior to admission, he was also diagnosed with yeast UTI and started on fluconazole.  Seen by neurosurgery and VP shunt readjusted on 03/24/2021.  He progressively worsened, unable to get out of bed, difficulty eating.  Admitted initially for acute metabolic encephalopathy, hypernatremia, hypercalcemia, dehydration, suspected UTI.  He had complained of neck stiffness for 2 weeks at home, due to concern for CNS infection, empiric meningitis coverage was started and patient was transferred from [facility] to [facility] for further evaluation and management.  No acyclovir started due to low suspicion for herpes encephalitis.  Neurology and neurosurgery were consulted.  Patient underwent LP 5/9.  Ongoing AMS with unsafe oral intake.  Family opted for [facility] tube placement and tube feeds.   Assessment & Plan:   Acute metabolic encephalopathy Decline in mental status started approximately 6 weeks PTA with a more rapid decline over the past 2 weeks PTA when he became nonverbal and increasingly lethargic.  Over the past 6 weeks, he was treated  for an ear infection with cefdinir and a 12-day course of prednisone, UTI with Cipro and fluconazole and his VP shunt was readjusted by neurosurgery.   Patient underwent CT scan of the head which showed mildly dilated lateral ventricles.  No acute findings noted.  EEG did not show any epileptiform activity.   Vitamin B12 level was 356.  B1 level is pending.  TSH 4.6.  Free T4 0.97.  Ammonia 28.  Urine culture without growth.  Blood cultures negative.  CSF work-up was unremarkable.  Antibiotics were discontinued. Patient remains on IV thiamine.  MRI brain was done which showed subacute to chronic infarct.  No acute findings noted.  Shunt setting was adjusted by neurosurgery yesterday. Patient noted to be more somnolent this morning.  Discussed with neurology who will assess.  Continue to monitor.  PT and OT evaluation   Folic acid deficiency Folate level noted to be 4.0.  Started on supplementation.  Neck rigidity/possible torticollis Evaluation negative for meningitis.  Symptomatic treatment with heating pads, topical menthol and Tylenol.  Avoid opiates or sedatives.   History of communicating hydrocephalus s/p VP shunt Neurosurgery had been following.  As per neurosurgery, he developed bilateral subdural fluid collections necessitating adjustment of his shunt valve pressure higher.  His subdural collections essentially resolved but he continues to have mild ventriculomegaly.  Shunt valve was adjusted again on 5/11 to maximize flow.  AKI, mild: Resolved.   Dehydration with hypernatremia: Serum sodium 150 on admission, likely related to poor oral intake.  Resolved.  Hypercalcemia Noted to have serum calcium of 11.5.  Resolved.  Hypokalemia/hypomagnesemia/hypophosphatemia Potassium was repleted.  Magnesium 2.0.  Electrolytes being monitored.  Dysphagia Unsafe oral intake due to AMS.  Speech therapy recommends NPO and alternate means of nutrition.  Previous rounding MD had a long  discussion with patient's daughter on 5/9 and the next daughter who is a pharmacist on 5/10, advised alternative nutrition options, risks and benefits, including temporary Panda tube feeds versus PEG, continued IVF for few more days to see if mental status improves and he can swallow safely.  Family opted for tube feeds. SLP continues to follow.  Type II DM Monitor CBGs and SSI as needed.  Metformin on hold.  CBGs noted to be elevated.  Likely due to tube feedings.  Will add coverage.  Questionable CVA Per CT head 03/16/2021: Stable appearance of hypodensity seen in the left anterior basal ganglia consistent with subacute infarction.  MRI brain shows subacute to chronic infarct.  No acute findings noted.  Essential hypertension Lisinopril on hold.  Blood pressure is noted to be elevated occasionally.  Currently on as needed agents.  Thrombocytopenia: Unclear etiology.?  Related to meds.  Resolved.  Adult failure to thrive Likely multifactorial.  Monitor closely.  PT and OT evaluation.  Urinary retention Had indwelling Foley catheter which was removed.  Monitor for retention.  Goals of care Despite maximal treatment patient has not shown significant improvement.  Today appears to be waxing and waning.  It appears that there is no further plans for intervention by neurosurgery.  We will see what neurology has to say today.  May not be unreasonable to involve palliative care at this time.  Will discuss with daughter today.   DVT prophylaxis: Place and maintain sequential compression device Start: 03/29/21 0749     Code Status: DNR Family Communication: Daughter was updated yesterday.  We will do so again today. Disposition: SNF recommended by PT.  Status is: Inpatient  Remains inpatient appropriate because:Inpatient level of care appropriate due to severity of illness   Dispo: The patient is from: Home              Anticipated d/c is to: TBD              Patient currently is not  medically stable to d/c.   Difficult to place patient No        Consultants:   Neurosurgery Neurology  Procedures:   LP by neurology 5/9.  Antimicrobials:    Anti-infectives (From admission, onward)   Start     Dose/Rate Route Frequency Ordered Stop   03/28/21 2200  cefTRIAXone (ROCEPHIN) 2 g in sodium chloride 0.9 % 100 mL IVPB  Status:  Discontinued        2 g 200 mL/hr over 30 Minutes Intravenous Every 12 hours 03/28/21 1244 03/31/21 0732   03/28/21 1800  vancomycin (VANCOREADY) IVPB 1000 mg/200 mL  Status:  Discontinued        1,000 mg 200 mL/hr over 60 Minutes Intravenous Every 24 hours 03/28/21 0006 03/28/21 0757   03/28/21 1600  vancomycin (VANCOREADY) IVPB 500 mg/100 mL  Status:  Discontinued        500 mg 100 mL/hr over 60 Minutes Intravenous Every 12 hours 03/28/21 1457 03/31/21 0732   03/28/21 1400  fluconazole (DIFLUCAN) IVPB 100 mg  Status:  Discontinued        100 mg 50 mL/hr over 60 Minutes Intravenous Every 24 hours 03/28/21 1239 03/30/21 1639   03/28/21 1330  vancomycin (VANCOREADY) IVPB 1000 mg/200 mL  Status:  Discontinued        1,000 mg 200 mL/hr over 60 Minutes Intravenous  Once 03/28/21 1244 03/28/21 1259   03/28/21 1300  ampicillin (OMNIPEN) 2 g in sodium chloride 0.9 % 100 mL IVPB  Status:  Discontinued        2 g 300 mL/hr over 20 Minutes Intravenous Every 6 hours 03/28/21 1244 03/31/21 0732   03/28/21 1000  fluconazole (DIFLUCAN) tablet 100 mg  Status:  Discontinued        100 mg Oral Daily 04/06/2021 2341 03/28/21 1239   03/28/21 0800  ceFEPIme (MAXIPIME) 2 g in sodium chloride 0.9 % 100 mL IVPB  Status:  Discontinued        2 g 200 mL/hr over 30 Minutes Intravenous Every 12 hours 03/28/21 0006 03/28/21 1244   03/28/21 0400  metroNIDAZOLE (FLAGYL) IVPB 500 mg  Status:  Discontinued        500 mg 100 mL/hr over 60 Minutes Intravenous Every 8 hours 04/08/2021 2346 03/28/21 0757   04/12/2021 1815  metroNIDAZOLE (FLAGYL) IVPB 500 mg        500 mg 100  mL/hr over 60 Minutes Intravenous  Once 04/15/2021 1805 04/06/2021 2049   03/31/2021 1815  ceFEPIme (MAXIPIME) 2 g in sodium chloride 0.9 % 100 mL IVPB        2 g 200 mL/hr over 30 Minutes Intravenous  Once 04/01/2021 1808 04/06/2021 2049   03/25/2021 1800  vancomycin (VANCOCIN) IVPB 1000 mg/200 mL premix        1,000 mg 200 mL/hr over 60 Minutes Intravenous  Once 04/06/2021 1752 04/01/2021 1951   04/14/2021 1515  cefTRIAXone (ROCEPHIN) 1 g in sodium chloride 0.9 % 100 mL IVPB  Status:  Discontinued        1 g 200 mL/hr over 30 Minutes Intravenous Every 24 hours 03/29/2021 1513 04/01/2021 1808        Subjective:  Patient noted to be awake.  But not as responsive as yesterday.  Objective:   Vitals:   04/01/21 2004 04/01/21 2349 04/02/21 0421 04/02/21 0818  BP: (!) 171/68 (!) 155/80 (!) 169/82 (!) 155/66  Pulse: 92 84 83 72  Resp: 19 18 18 18   Temp: 97.6 F (36.4 C) 98.2 F (36.8 C) 97.8 F (36.6 C) 97.8 F (36.6 C)  TempSrc:  Oral Oral Oral  SpO2: 98% 97% 99% 97%  Weight:  68.2 kg    Height:        General appearance: Awake alert.  In no distress.  Distracted Resp: Clear to auscultation bilaterally.  Normal effort Cardio: S1-S2 is normal regular.  No S3-S4.  No rubs murmurs or bruit GI: Abdomen is soft.  Nontender nondistended.  Bowel sounds are present normal.  No masses organomegaly Extremities: No edema.   Neurologic: Distracted.  Does not answer questions.  No obvious focal deficits noted.      Data Reviewed:   I have personally reviewed following labs and imaging studies   CBC: Recent Labs  Lab 04/04/2021 1406 03/28/21 0349 03/29/21 0536 03/30/21 1659  WBC 14.7* 13.5* 11.7* 9.6  NEUTROABS 12.2*  --  8.3*  --   HGB 15.8 13.2 13.3 13.6  HCT 48.1 40.7 39.6 40.4  MCV 99.0 101.2* 97.8 96.4  PLT 184 143* 134* 696    Basic Metabolic Panel: Recent Labs  Lab 03/30/21 1659 03/31/21 0358 03/31/21 1401 03/31/21 1854 04/01/21 0447 04/01/21 1633  NA 142 141  --   --  139  --  K 3.0* 3.6  --   --  3.7  --   CL 112* 111  --   --  107  --   CO2 24 25  --   --  28  --   GLUCOSE 152* 172*  --   --  286*  --   BUN 13 10  --   --  14  --   CREATININE 0.62 0.58*  --   --  0.85  --   CALCIUM 9.6 9.6  --   --  9.5  --   MG  --  1.6*   < > 2.0 1.9 1.9  PHOS  --   --    < > 1.5* 1.9* 2.5   < > = values in this interval not displayed.    Liver Function Tests: Recent Labs  Lab 04-07-21 1406 03/29/21 0633 04/01/21 0447  AST 16 14* 35  ALT 14 13 40  ALKPHOS 134* 97 115  BILITOT 0.5 0.3 0.5  PROT 6.0* 4.5* 4.3*  ALBUMIN 2.6* 2.0* 2.0*    CBG: Recent Labs  Lab 04/01/21 2349 04/02/21 0419 04/02/21 0818  GLUCAP 190* 159* 191*    Microbiology Studies:   Recent Results (from the past 240 hour(s))  Culture, blood (Routine x 2)     Status: None   Collection Time: 04/07/2021  2:06 PM   Specimen: BLOOD  Result Value Ref Range Status   Specimen Description   Final    BLOOD LEFT ANTECUBITAL Performed at [facility], [place] [address] [place], [place] [address]    Special Requests   Final    BOTTLES DRAWN AEROBIC AND ANAEROBIC Blood Culture adequate volume   Culture   Final    NO GROWTH 5 DAYS Performed at [facility], [place] [address] [place], [place] [address]    Report Status 04/01/2021 FINAL  Final  Culture, blood (Routine x 2)     Status: None   Collection Time: Apr 07, 2021  2:12 PM   Specimen: Site Not Specified; Blood  Result Value Ref Range Status   Specimen Description   Final    SITE NOT SPECIFIED Performed at [place] [address] [place], [place] [address]    Special Requests   Final    BOTTLES DRAWN AEROBIC AND ANAEROBIC Blood Culture adequate volume   Culture   Final    NO GROWTH 5 DAYS Performed at [facility], [place] [address] [place], [place] [address]    Report Status 04/01/2021 FINAL  Final  Urine culture     Status: None   Collection Time: 04-07-2021  2:14 PM   Specimen:  Urine, Random  Result Value Ref Range Status   Specimen Description   Final    URINE, RANDOM Performed at [place] [address], [place], [place] [address]    Special Requests   Final    NONE Performed at [facility], [place] [address], [place], [place] [address]    Culture   Final    NO GROWTH Performed at [facility], [place] [address], [place],  [address]    Report Status 03/28/2021 FINAL  Final  Resp Panel by RT-PCR (Flu A&B, Covid) Nasopharyngeal Swab     Status: None   Collection Time: 07-Apr-2021  3:15 PM   Specimen: Nasopharyngeal Swab; Nasopharyngeal(NP) swabs in vial transport medium  Result Value Ref Range Status   SARS Coronavirus 2 by  RT PCR NEGATIVE NEGATIVE Final    Comment: (NOTE) SARS-CoV-2 target nucleic acids are NOT DETECTED.  The SARS-CoV-2 RNA is generally detectable in upper respiratory specimens during the acute phase of infection. The lowest concentration of SARS-CoV-2 viral copies this assay can detect is 138 copies/mL. A negative result does not preclude SARS-Cov-2 infection and should not be used as the sole basis for treatment or other patient management decisions. A negative result may occur with  improper specimen collection/handling, submission of specimen other than nasopharyngeal swab, presence of viral mutation(s) within the areas targeted by this assay, and inadequate number of viral copies(<138 copies/mL). A negative result must be combined with clinical observations, patient history, and epidemiological information. The expected result is Negative.  Fact Sheet for Patients:  BloggerCourse.comhttps://www.fda.gov/media/152166/download  Fact Sheet for Healthcare Providers:  SeriousBroker.ithttps://www.fda.gov/media/152162/download  This test is no t yet approved or cleared by the [place] FDA and  has been authorized for detection and/or diagnosis of SARS-CoV-2 by FDA under an Emergency Use Authorization (EUA). This  EUA will remain  in effect (meaning this test can be used) for the duration of the COVID-19 declaration under Section 564(b)(1) of the Act, 21 U.S.C.section 360bbb-3(b)(1), unless the authorization is terminated  or revoked sooner.       Influenza A by PCR NEGATIVE NEGATIVE Final   Influenza B by PCR NEGATIVE NEGATIVE Final    Comment: (NOTE) The Xpert Xpress SARS-CoV-2/FLU/RSV plus assay is intended as an aid in the diagnosis of influenza from Nasopharyngeal swab specimens and should not be used as a sole basis for treatment. Nasal washings and aspirates are unacceptable for Xpert Xpress SARS-CoV-2/FLU/RSV testing.  Fact Sheet for Patients: BloggerCourse.comhttps://www.fda.gov/media/152166/download  Fact Sheet for Healthcare Providers: SeriousBroker.ithttps://www.fda.gov/media/152162/download  This test is not yet approved or cleared by the [place] FDA and has been authorized for detection and/or diagnosis of SARS-CoV-2 by FDA under an Emergency Use Authorization (EUA). This EUA will remain in effect (meaning this test can be used) for the duration of the COVID-19 declaration under Section 564(b)(1) of the Act, 21 U.S.C. section 360bbb-3(b)(1), unless the authorization is terminated or revoked.  Performed at [facility], [address] [place], [place] [address]   CSF culture w Stat Gram Stain     Status: None (Preliminary result)   Collection Time: 03/30/21  1:48 PM   Specimen: CSF; Cerebrospinal Fluid  Result Value Ref Range Status   Specimen Description CSF  Final   Special Requests NONE  Final   Gram Stain NO WBC SEEN NO ORGANISMS SEEN CYTOSPIN SMEAR   Final   Culture   Final    NO GROWTH 2 DAYS Performed at [facility], [address] [place], [place] [address]    Report Status PENDING  Incomplete     Radiology Studies:  MR BRAIN WO CONTRAST  Result Date: 04/01/2021 CLINICAL DATA:  Shunted hydrocephalus, altered mental status EXAM: MRI HEAD WITHOUT  CONTRAST TECHNIQUE: Multiplanar, multiecho pulse sequences of the brain and surrounding structures were obtained without intravenous contrast. COMPARISON:  October 2018 FINDINGS: There is susceptibility artifact from shunt apparatus. Adjacent parenchyma is variably obscured or distorted depending on sequence. Findings below are within this limitation. Brain: Small area of mildly reduced diffusion involving the left caudate head. There is susceptibility hypointensity in this region likely reflecting chronic blood products and diffusion hyperintensity is probably artifactual. Right posterior approach shunt catheter enters the right lateral ventricle with tip within the body along the septum pellucidum. Ventricle  caliber has minimally increased compared to 2018. There are thin bilateral subdural collections measuring up to 4-5 mm in thickness bilaterally. No mass effect. Patchy and confluent areas of T2 hyperintensity in the supratentorial white matter are nonspecific but probably reflect mild to moderate chronic microvascular ischemic changes. Prominence of the ventricles and sulci reflects generalized parenchymal volume loss. There is no intracranial mass, mass effect, or edema. Vascular: Major vessel flow voids at the skull base are preserved. Skull and upper cervical spine: Normal marrow signal is preserved. Sinuses/Orbits: Mild mucosal thickening. Bilateral lens replacements. Other: Sella is unremarkable. Persistent mastoid effusions. Decrease in size of small T2 hyperintense right parotid lesion. IMPRESSION: No acute infarction. Subacute to chronic infarct of the left caudate head with chronic blood products. Thin bilateral subdural collections without mass effect. Correlate with recent change in shunt settings. Ventricle caliber is minimally increased compared to 2018 MRI. Chronic microvascular ischemic changes. Electronically Signed   By: Macy Mis M.D.   On: 04/01/2021 12:52   DG Naso G Tube Plc W/Fl  W/Rad  Result Date: 03/31/2021 CLINICAL DATA:  Feeding tube placement EXAM: NASO G TUBE PLACEMENT WITH FL AND WITH RAD FLUOROSCOPY TIME:  Fluoroscopy Time:  3 minutes 24 seconds Radiation Exposure Index (if provided by the fluoroscopic device): 43 mGy Number of Acquired Spot Images: 0 COMPARISON:  None. FINDINGS: Feeding tube was placed under fluoroscopic guidance. The tip was advanced into the descending duodenum. IMPRESSION: Transpyloric feeding tube placed with the tip in the descending duodenum. Electronically Signed   By: Rolm Baptise M.D.   On: 03/31/2021 16:47     Scheduled Meds:   . Chlorhexidine Gluconate Cloth  6 each Topical Daily  . cyanocobalamin  1,000 mcg Intramuscular Q30 days  . feeding supplement (PROSource TF)  45 mL Per Tube Daily  . folic acid  1 mg Intravenous Daily  . insulin aspart  0-6 Units Subcutaneous Q4H  . insulin glargine  10 Units Subcutaneous Daily  . thiamine injection  100 mg Intravenous Daily    Continuous Infusions:   . feeding supplement (OSMOLITE 1.5 CAL) 50 mL/hr at 04/01/21 2209     LOS: 6 days     Bonnielee Haff, [facility]    To contact the attending provider between 7A-7P or the covering provider during after hours 7P-7A, please log into the web site [email or web address] and access using universal [place] password for that web site. If you do not have the password, please call the hospital operator.  04/02/2021, 8:48 AM

## 2021-04-02 NOTE — Consult Note (Signed)
Consultation Note Date: 04/02/2021   Patient Name: Robert Gross  DOB: June 08, 1929  MRN: [id number]  Age / Sex: 87 y.o., male  PCP: [name], MD Referring Physician: Bonnielee Haff, MD  Reason for Consultation: Establishing goals of care  HPI/Patient Profile: 87 y.o. male  with past medical history of hydrocephalus s/p VP shunt, hypertension, diabetes, GERD, vocal cord paralysis, hypercalcemia, BPH, recent stroke, dementia (per family report) admitted on 04/16/2021 with altered mental status. Multiple health complications and infections over the 6 weeks prior to admission with progressing physical decline, weakness, and poor intake. Ongoing encephalopathy and not alert enough for po intake. Palliative care requested to assist with goals of care in setting of poor progress and poor prognosis.   Clinical Assessment and Goals of Care: I met today at Robert Gross's bedside with his wife of 2 yrs. He is very lethargic and mostly unresponsive but does awaken and speak for ~2-3 minutes and then back in deep sleep mostly unresponsive. 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 She talks of a ramp he built for their home not too long ago. She reports that he was walking with walker and eating well (although with noted weight loss) just prior to admission. She does acknowledge that he has had health issues and decline over the past few years. They have 2 daughters and 1 son although their son is unfortunately not involved.   Discussed with wife concern for Robert Gross's status and lack of improvement. She remains hopeful for improvement but acknowledges that she fears he may not get any better. She shares that she was told he would not live long without feeding tube. She also shares that this would  not be a quality of life he would want and he would not want a long term feeding tube. She asks many questions about disposition and we did briefly discuss options including hospice options and how this differs from home health and therapy.   Ultimately we agreed the best course of action was to meet together with herself and her two daughters to further discuss goals of care and where to go from here. Daughters are best available on Sunday and we agreed to meet at 1000 am. This will hopefully give Robert Gross more time to see if he can have any further improvement.   Primary Decision Maker Wife along with her 2 daughters    SUMMARY OF RECOMMENDATIONS   - DNR in place - Goals of care family meeting 04/05/21 1000am  Code Status/Advance Care Planning:  DNR   Symptom Management:   Per attending  Palliative Prophylaxis:   Aspiration, Delirium Protocol, Oral Care and Turn Reposition  Psycho-social/Spiritual:   Desire for further Chaplaincy support:no - they have support from their community pastor  Prognosis:   Overall prognosis poor.   Discharge Planning: To Be Determined  Primary Diagnoses: Present on Admission: . Acute metabolic encephalopathy . Essential hypertension . UTI (urinary tract infection) . Communicating hydrocephalus ([place]) . Hypercalcemia   I have reviewed the medical record, interviewed the patient and family, and examined the patient. The following aspects are pertinent.  Past Medical History:  Diagnosis Date  . Arthritis   . BPH (benign prostatic hyperplasia)   . BPH with urinary obstruction   . Cataracts, bilateral   . Dizzy spells    residual from concussion 09-05-2017 intermittantly when turns head but stated as of 09-26-2017 no issues for past 2-3 days  . Essential hypertension   . Gait abnormality   . GERD (gastroesophageal reflux disease)   . History of concussion    09-05-2017  w/ loc --- per pt residual intermittant dizziness when he  turns his head either way  . History of prostatitis   . History of sepsis    09-12-2017  urosepsis  . History of urinary retention   . Hydrocephalus ([place])   . Hypercalcemia   . Hyperlipemia   . Memory loss   . Metabolic encephalopathy   . Osteopenia   . Squamous cell carcinoma in situ    multiple spots  . Type 2 diabetes mellitus ([place])   . Urinary retention    requires intermittent caths  . Vitamin D deficiency   . Vocal cord paralysis   . Wears partial dentures    upper   Social History   Socioeconomic History  . Marital status: Married    Spouse name: Not on file  . Number of children: 2  . Years of education: college  . Highest education level: Associate degree: occupational, Hotel manager, or vocational program  Occupational History  . Occupation: Retired from [employer]  . Smoking status: Former Smoker    Years: 1.00    Types: Cigarettes    Quit date: 09/27/1967    Years since quitting: 53.5  . Smokeless tobacco: Never Used  Vaping Use  . Vaping Use: Never used  Substance and Sexual Activity  . Alcohol use: Not Currently  . Drug use: No  . Sexual activity: Not on file  Other Topics Concern  . Not on file  Social History Narrative   Lives at home with wife.   Right-handed.   2 cups caffeine per day.   Social Determinants of Health   Financial Resource Strain: Not on file  Food Insecurity: Not on file  Transportation Needs: Not on file  Physical Activity: Not on file  Stress: Not on file  Social Connections: Not on file   Family History  Problem Relation Age of Onset  . Pancreatic cancer Mother   . Melanoma Sister   . Lung cancer Brother   . Cancer Father        unsure of type   Scheduled Meds: . Chlorhexidine Gluconate Cloth  6 each Topical Daily  . cyanocobalamin  1,000 mcg Intramuscular Q30 days  . feeding supplement (PROSource TF)  45 mL Per Tube Daily  . folic acid  1 mg Intravenous Daily  . insulin aspart  0-6  Units Subcutaneous Q4H  . insulin glargine  10 Units Subcutaneous Daily  . thiamine injection  100 mg Intravenous Daily   Continuous Infusions: . feeding supplement (OSMOLITE 1.5 CAL) 1,000 mL (04/02/21 1439)   PRN Meds:.labetalol, Muscle Rub No Known Allergies Review of Systems  Unable to perform ROS: Acuity of condition    Physical Exam Vitals and nursing note  reviewed.  Constitutional:      General: He is sleeping. He is not in acute distress.    Appearance: He is ill-appearing.     Comments: Frail, elderly  Cardiovascular:     Rate and Rhythm: Normal rate.  Pulmonary:     Effort: No tachypnea, accessory muscle usage or respiratory distress.  Abdominal:     Palpations: Abdomen is soft.  Neurological:     Comments: Minimally responsive and does not follow commands. He did arouse spontaneously for only 2-3 minutes and then again minimally responsive.      Vital Signs: BP (!) 148/75   Pulse 79   Temp 97.8 F (36.6 C) (Oral)   Resp 18   Ht _0  (1.727 m)   Wt 68.2 kg   SpO2 97%   BMI 22.86 kg/m  Pain Scale: Faces POSS *See Group Information*: S-Acceptable,Sleep, easy to arouse Pain Score: 0-No pain   SpO2: SpO2: 97 % O2 Device:SpO2: 97 % O2 Flow Rate: .   IO: Intake/output summary:   Intake/Output Summary (Last 24 hours) at 04/02/2021 1512 Last data filed at 04/02/2021 1439 Gross per 24 hour  Intake 4115.32 ml  Output 2200 ml  Net 1915.32 ml    LBM: Last BM Date: 04/02/21 Baseline Weight: Weight: 69 kg Most recent weight: Weight: 68.2 kg     Palliative Assessment/Data:     Time In: 1430 Time Out: 1520 Time Total: 50 min Greater than 50%  of this time was spent counseling and coordinating care related to the above assessment and plan.  Signed by: Vinie Sill, NP Palliative Medicine Team Pager # [phone] (M-F 8a-5p) Team Phone # [phone] (Nights/Weekends)

## 2021-04-02 NOTE — Plan of Care (Signed)
  Problem: Health Behavior/Discharge Planning: Goal: Ability to manage health-related needs will improve Outcome: Progressing   Problem: Activity: Goal: Risk for activity intolerance will decrease Outcome: Progressing   Problem: Nutrition: Goal: Adequate nutrition will be maintained Outcome: Progressing   Problem: Education: Goal: Knowledge of General Education information will improve Description: Including pain rating scale, medication(s)/side effects and non-pharmacologic comfort measures Outcome: Progressing

## 2021-04-03 DIAGNOSIS — E119 Type 2 diabetes mellitus without complications: Secondary | ICD-10-CM | POA: Diagnosis not present

## 2021-04-03 DIAGNOSIS — I10 Essential (primary) hypertension: Secondary | ICD-10-CM | POA: Diagnosis not present

## 2021-04-03 DIAGNOSIS — G9341 Metabolic encephalopathy: Secondary | ICD-10-CM | POA: Diagnosis not present

## 2021-04-03 LAB — BASIC METABOLIC PANEL
Anion gap: 2 — ABNORMAL LOW (ref 5–15)
BUN: 16 mg/dL (ref 8–23)
CO2: 30 mmol/L (ref 22–32)
Calcium: 9.3 mg/dL (ref 8.9–10.3)
Chloride: 104 mmol/L (ref 98–111)
Creatinine, Ser: 0.7 mg/dL (ref 0.61–1.24)
GFR, Estimated: >60 mL/min (ref >60)
Glucose, Bld: 222 mg/dL — ABNORMAL HIGH (ref 70–99)
Potassium: 4.2 mmol/L (ref 3.5–5.1)
Sodium: 136 mmol/L (ref 135–145)

## 2021-04-03 LAB — GLUCOSE, CAPILLARY
Glucose-Capillary: 143 mg/dL — ABNORMAL HIGH (ref 70–99)
Glucose-Capillary: 169 mg/dL — ABNORMAL HIGH (ref 70–99)
Glucose-Capillary: 181 mg/dL — ABNORMAL HIGH (ref 70–99)
Glucose-Capillary: 192 mg/dL — ABNORMAL HIGH (ref 70–99)
Glucose-Capillary: 208 mg/dL — ABNORMAL HIGH (ref 70–99)
Glucose-Capillary: 261 mg/dL — ABNORMAL HIGH (ref 70–99)

## 2021-04-03 MED ORDER — AMLODIPINE BESYLATE 5 MG PO TABS
5.0000 mg | ORAL_TABLET | Freq: Every day | ORAL | Status: DC
  Administered 2021-04-03 – 2021-04-05 (×3): 5 mg
  Filled 2021-04-03 (×4): qty 1

## 2021-04-03 NOTE — Progress Notes (Signed)
  Speech Language Pathology Treatment: Dysphagia  Patient Details Name: Robert Gross MRN: [id number] DOB: 08/11/1929 Today's Date: 04/03/2021 Time: 3557-3220 SLP Time Calculation (min) (ACUTE ONLY): 18 min  Assessment / Plan / Recommendation Clinical Impression  Pt today seen to assess readiness for po diet, improvement with nutrition provided via small bore feeding tube.  Pt awake during session. Nonverbal but he did nod yes x2 during session.   Congested cough noted at baseline - and pt did not cough nor swallow with max cues.  SLP provided pt with oral moisture via toothette and 1/8 tsp of water.  Delayed swallow with right anterior spillage noted with post-swallow cough - Pt did not clear congested cough during session.  SLP removed retained water from oral cavity with oral suction - ptt is slightly resistant  pushing oral suction away.     Unfortunately Mr Paradiso's swallow has not improved with nutrition/ongoing medical treatment. At times, pt stared straight ahead and did not respond to SLP.    Wife arrived when SLP leaving and therefore SLP informed her to findings/concerns for ongoing severe dysphagia not allowing po intake and suspected secretion aspiration.  Advised prognosis for swallow is poor at this point.  Note per chart, no PEG tube planned - confirmed this would not keep pt from aspirating.   Advised if pt becomes comfort care, comfort may only be oral moisture for this pt as po is causing him to overtly cough and he declines more intake.  Posted swallow precaution sign and reviewed with wife.    SLP will sign off at this time as wife has been educated to recommendations.     HPI HPI: 87 year old male with history of communicating hydrocephalus status post VP shunt, hypertension, diabetes mellitus type 2, BPH, GERD, recent CVA, hyperlipidemia presented with worsening confusion and weakness and poor p.o. intake.  Patient was recently treated for otitis media by his PCP with  Omnicef and prednisone for 2 weeks.  Since then patient has gradually declined with poor p.o. intake. Surgery 03/24/21 for VP shut pressure w/o improvement.  LP pending to r/o meningitis.  Pt negative for meningitis. He has had small bore feeding tube placed for nutrition to monitor for improvement. Pt underwent MBS during this hospital stay - which showed aspiration of all consistencies.      SLP Plan  Continue with current plan of care       Recommendations  Medication Administration: Via alternative means Compensations: Other (Comment)                Oral Care Recommendations: Oral care QID Follow up Recommendations: Skilled Nursing facility SLP Visit Diagnosis: Dysphagia, oropharyngeal phase (R13.12) Plan: Continue with current plan of care       GO              Kathleen Lime, MS [facility] SLP [place] Office [phone] Pager [phone]    Robert Gross 04/03/2021, 11:12 AM

## 2021-04-03 NOTE — Progress Notes (Signed)
Neurology Progress Note  Interval history/Brief HPI: Neurology was asked to consult on patient with known communicating hydrocephalus s/p VP shunt placement in past, for acute encephalopathy on 03/29/21. At that time, his EEG was negative for seizures or epileptiform discharges. Due to ventricular dilation of lateral ventricles on CT and nuchal rigidity, concern was for meningitis. An LP with unremarkable for infection so empiric treatment for meningitis was discontinued. MRI brain revealed no acute infarction but subacute to chronic infarct to the left caudate head with chronic blood products. Patient was started on B12 supplementation and high dose Thiamine. Neurosurgery was consulted on 04/01/21 and shunt was interrogated and pressure reset. Comment by NSU was that patient's ventriculomegaly was unchanged. Even with these interventions, patient remained encephalopathic.  Palliative care was consulted on 04/02/21 and patient was made a DNR. However, family wanted tube feedings until family meeting on 04/05/21.   Patient has had multiple metabolic derangements, AKI, dysphagia, Folic acid deficiency, and Adult FTT.   O: Current vital signs: BP (!) 160/96 (BP Location: Left Arm)   Pulse 93   Temp 98 F (36.7 C)   Resp 18   Ht 5\' 8"  (1.727 m)   Wt 62.3 kg   SpO2 99%   BMI 20.88 kg/m  Vital signs in last 24 hours: Temp:  [97.7 F (36.5 C)-98.7 F (37.1 C)] 98 F (36.7 C) (05/13 0759) Pulse Rate:  [66-93] 93 (05/13 0759) Resp:  [18] 18 (05/13 0759) BP: (135-160)/(54-97) 160/96 (05/13 0759) SpO2:  [96 %-99 %] 99 % (05/13 0759) Weight:  [62.3 kg] 62.3 kg (05/13 0424)  GENERAL: Awake, alert in NAD HEENT: Normocephalic and atraumatic LUNGS: Normal respiratory effort.  CV: RRR.  ABDOMEN: Soft, nontender Ext: warm Psych: Withdrawn.   NEURO:  Mental Status: awakens with name calling. Will not turn his head to the left. With neck exam, patient grimaces with the movement.  Speech/Language: non  verbal.   Cranial Nerves: Note: Parts of exam that are missing due to patient not following commands.  II: PERRL 18mm, sluggish.  [name], IV, VI: Eyelids elevate symmetrically.  V, VII: face is grossly symmetric with grimacing.  VIII: hearing intact to voice. Opens eyes when asked.  IX, X: Non verbal, will not open mouth.  Tone is resistant to moving extremities. Bulk is greatly decreased.  Sensation- withdraws to pain x 4 extremities.  Gait: deferred  Medications  Current Facility-Administered Medications:  .  Chlorhexidine Gluconate Cloth 2 % PADS 6 each, 6 each, Topical, Daily, Lavina Hamman, MD, 6 each at 04/02/21 1005 .  cyanocobalamin ((VITAMIN B-12)) injection 1,000 mcg, 1,000 mcg, Intramuscular, Q30 days, Donnetta Simpers, MD, 1,000 mcg at 04/01/21 1540 .  feeding supplement (OSMOLITE 1.5 CAL) liquid 1,000 mL, 1,000 mL, Per Tube, Continuous, Hongalgi, Anand D, MD, Last Rate: 50 mL/hr at 04/02/21 1439, 1,000 mL at 04/02/21 1439 .  feeding supplement (PROSource TF) liquid 45 mL, 45 mL, Per Tube, Daily, Hongalgi, Anand D, MD, 45 mL at 04/02/21 0900 .  folic acid injection 1 mg, 1 mg, Intravenous, Daily, Donnetta Simpers, MD, 1 mg at 04/02/21 1003 .  insulin aspart (novoLOG) injection 0-6 Units, 0-6 Units, Subcutaneous, Q4H, Hongalgi, Lenis Dickinson, MD, 1 Units at 04/03/21 0829 .  insulin glargine (LANTUS) injection 10 Units, 10 Units, Subcutaneous, Daily, Bonnielee Haff, MD, 10 Units at 04/02/21 0900 .  labetalol (NORMODYNE) injection 10 mg, 10 mg, Intravenous, Q2H PRN, Opyd, Ilene Qua, MD, 10 mg at 03/31/21 0325 .  Muscle Rub CREA, ,  Topical, PRN, Donnetta Simpers, MD .  thiamine (B-1) injection 100 mg, 100 mg, Intravenous, Daily, Lavina Hamman, MD, 100 mg at 04/02/21 0900  Pertinent LabsResults for MARQUIST, BINSTOCK (MRN [id number]) as of 04/03/2021 09:39  Ref. Range 03/30/2021 13:48  Appearance, CSF Latest Ref Range: CLEAR  CLEAR  Glucose, CSF Latest Ref Range: 40 - 70 mg/dL 101 (H)   RBC Count, CSF Latest Ref Range: 0 /cu mm 280 (H)  WBC, CSF Latest Ref Range: 0 - 5 /cu mm 1  Other Cells, CSF Unknown TOO FEW TO COUNT, SMEAR AVAILABLE FOR REVIEW  Color, CSF Latest Ref Range: COLORLESS  COLORLESS  Supernatant Unknown NOT INDICATED  Total  Protein, CSF Latest Ref Range: 15 - 45 mg/dL 88 (H)  CSF culture negative. Protein elevation is expected due to advanced age.   MD has reviewed images in epic and the results pertinent to this consultation are:  CT Head Mildly dilated lateral ventricles slightly greater on LEFT, minimally increased from previous exam. Atrophy with small vessel chronic ischemic changes of deep cerebral white matter.  MRI Brain  No acute infarction. Subacute to chronic infarct of the left caudate head with chronic blood products. Thin bilateral subdural collections without mass effect. Correlate with recent change in shunt settings. Ventricle caliber is minimally increased compared to 2018 MRI. Chronic microvascular ischemic changes.  Assessment: 87 yo male who presented with acute encephalopathy. Patient with known communicating hydrocephalus who has minor increase to ventriculomegaly here. Shunt was adjusted by NSU on 04/01/21 which has also made little to no difference in his mental status. His Vitamin B12 and B1 is being supplemented due to low normal results. MRI brain showed a subacute to chronic infarct of the left caudate, but we do not feel this is the cause of his presentation. His encephalopathy is likely due to his chronic illnesses, delirium, metabolic derangements, and FTT.   Plan: 1. Continue to monitor and correct metabolic derangements as you are doing.  2. Continue Vit B12 and Thiamine supplementation.  3. Agree with palliative involvement.  4. Shunt adjustment per NSU and they feel there is no need for further intervention.  5. No further neurology workup needed. If further questions, we will be available.   Pt seen by Clance Boll, MSN, APN-BC/Nurse Practitioner/Neuro and later by MD. Note and plan to be edited as needed by MD.  Pager: [phone]

## 2021-04-03 NOTE — Progress Notes (Signed)
PROGRESS NOTE   Robert Gross  PJA:[id number]    DOB: 17-Jul-1929    DOA: 04/18/2021  PCP: Alysia Penna, MD    Chief Complaint  Patient presents with  . Altered Mental Status    Brief Narrative:  87 year old male with medical history significant for but not limited to communicating hydrocephalus s/p VP shunt, family reported dementia, hypertension, DM2, BPH, GERD, recent CVA, hyperlipidemia, presented with worsening confusion, weakness and poor oral intake.  He was reportedly doing well about 6 weeks ago, then sometime in March he was diagnosed with an ear infection and started on antibiotics and 2 weeks of prednisone.  During the steroid course, he had hypertension and hyperglycemia up to the 200s.  Once the prednisone was stopped, there was a sharp decline in his health and progressive weakness.  CT head for shunt follow-up 4/5 showed new subacute infarct.  Week prior to admission, he was also diagnosed with yeast UTI and started on fluconazole.  Seen by neurosurgery and VP shunt readjusted on 03/24/2021.  He progressively worsened, unable to get out of bed, difficulty eating.  Admitted initially for acute metabolic encephalopathy, hypernatremia, hypercalcemia, dehydration, suspected UTI.  He had complained of neck stiffness for 2 weeks at home, due to concern for CNS infection, empiric meningitis coverage was started and patient was transferred [facility] to [facility] for further evaluation and management.  No acyclovir started due to low suspicion for herpes encephalitis.  Neurology and neurosurgery were consulted.  Patient underwent LP 5/9.  Ongoing AMS with unsafe oral intake.  Family opted for [facility] tube placement and tube feeds.   Assessment & Plan:   Acute metabolic encephalopathy Decline in mental status started approximately 6 weeks PTA with a more rapid decline over the past 2 weeks PTA when he became nonverbal and increasingly lethargic.  Over the past 6 weeks, he was treated  for an ear infection with cefdinir and a 12-day course of prednisone, UTI with Cipro and fluconazole and his VP shunt was readjusted by neurosurgery.   Patient underwent CT scan of the head which showed mildly dilated lateral ventricles.  No acute findings noted.  EEG did not show any epileptiform activity.   Vitamin B12 level was 356.  B1 level is pending.  TSH 4.6.  Free T4 0.97.  Ammonia 28.  Urine culture without growth.  Blood cultures negative.  CSF work-up was unremarkable.  Antibiotics were discontinued. Patient remains on IV thiamine.  MRI brain was done which showed subacute to chronic infarct.  No acute findings noted.  Shunt setting was adjusted by neurosurgery on 5/11. Patient's mentation seems to be waxing and waning.  More awake today. Palliative care discussed with patient's wife.  Plan is for family meeting on Sunday.  Folic acid deficiency Folate level noted to be 4.0.  Started on supplementation.  Neck rigidity/possible torticollis Evaluation negative for meningitis.  Symptomatic treatment with heating pads, topical menthol and Tylenol.  Avoid opiates or sedatives.   History of communicating hydrocephalus s/p VP shunt Neurosurgery had been following.  As per neurosurgery, he developed bilateral subdural fluid collections necessitating adjustment of his shunt valve pressure higher.  His subdural collections essentially resolved but he continues to have mild ventriculomegaly.  Shunt valve was adjusted again on 5/11 to maximize flow.  AKI, mild Resolved.   Dehydration with hypernatremia: Serum sodium 150 on admission, likely related to poor oral intake.  Resolved.  Hypercalcemia Noted to have serum calcium of 11.5.  Resolved.  Hypokalemia/hypomagnesemia/hypophosphatemia Potassium was normal.  Magnesium was 1.9.   Dysphagia Unsafe oral intake due to AMS.  Speech therapy recommended NPO and alternate means of nutrition.  After discussions with family core track feeding  tube was placed and the patient was started on tube feedings. Speech therapy has been following however due to his waxing and waning mental status today continue to recommend n.p.o. status.    Type II DM CBGs were noted to be elevated due to his tube feedings.  Started on Lantus.  Continue SSI.  HbA1c 9.3.  Questionable CVA Per CT head 03/16/2021: Stable appearance of hypodensity seen in the left anterior basal ganglia consistent with subacute infarction.  MRI brain shows subacute to chronic infarct.  No acute findings noted.  Essential hypertension Lisinopril on hold.  Blood pressure is noted to be elevated occasionally.  Currently on as needed agents.  Thrombocytopenia: Unclear etiology.?  Related to meds.  Resolved.  Adult failure to thrive Likely multifactorial.  Monitor closely.  PT and OT evaluation.  Urinary retention Had indwelling Foley catheter which was removed.  Monitor for retention.  Goals of care Despite maximal treatment patient has not shown significant improvement.  Mentation has been waxing and waning.  It appears that there is no further plans for intervention by neurosurgery.  Palliative care was involved.  There planning a family meeting on Sunday.  Prognosis is poor due to his inability to take oral nutrition.     DVT prophylaxis: Place and maintain sequential compression device Start: 03/29/21 0749     Code Status: DNR Family Communication: No family at bedside.   Disposition: SNF recommended by PT.  Status is: Inpatient  Remains inpatient appropriate because:Inpatient level of care appropriate due to severity of illness   Dispo: The patient is from: Home              Anticipated d/c is to: TBD              Patient currently is not medically stable to d/c.   Difficult to place patient No        Consultants:   Neurosurgery Neurology  Procedures:   LP by neurology 5/9.  Antimicrobials:    Anti-infectives (From admission, onward)   Start      Dose/Rate Route Frequency Ordered Stop   03/28/21 2200  cefTRIAXone (ROCEPHIN) 2 g in sodium chloride 0.9 % 100 mL IVPB  Status:  Discontinued        2 g 200 mL/hr over 30 Minutes Intravenous Every 12 hours 03/28/21 1244 03/31/21 0732   03/28/21 1800  vancomycin (VANCOREADY) IVPB 1000 mg/200 mL  Status:  Discontinued        1,000 mg 200 mL/hr over 60 Minutes Intravenous Every 24 hours 03/28/21 0006 03/28/21 0757   03/28/21 1600  vancomycin (VANCOREADY) IVPB 500 mg/100 mL  Status:  Discontinued        500 mg 100 mL/hr over 60 Minutes Intravenous Every 12 hours 03/28/21 1457 03/31/21 0732   03/28/21 1400  fluconazole (DIFLUCAN) IVPB 100 mg  Status:  Discontinued        10 0 mg 50 mL/hr over 60 Minutes Intravenous Every 24 hours 03/28/21 1239 03/30/21 1639   03/28/21 1330  vancomycin (VANCOREADY) IVPB 1000 mg/200 mL  Status:  Discontinued        1,000 mg 200 mL/hr over 60 Minutes Intravenous  Once 03/28/21 1244 03/28/21 1259   03/28/21 1300  ampicillin (OMNIPEN) 2 g in sodium chloride 0.9 % 100  mL IVPB  Status:  Discontinued        2 g 300 mL/hr over 20 Minutes Intravenous Every 6 hours 03/28/21 1244 03/31/21 0732   03/28/21 1000  fluconazole (DIFLUCAN) tablet 100 mg  Status:  Discontinued        100 mg Oral Daily 2021-04-17 2341 03/28/21 1239   03/28/21 0800  ceFEPIme (MAXIPIME) 2 g in sodium chloride 0.9 % 100 mL IVPB  Status:  Discontinued        2 g 200 mL/hr over 30 Minutes Intravenous Every 12 hours 03/28/21 0006 03/28/21 1244   03/28/21 0400  metroNIDAZOLE (FLAGYL) IVPB 500 mg  Status:  Discontinued        500 mg 100 mL/hr over 60 Minutes Intravenous Every 8 hours 2021/04/17 2346 03/28/21 0757   2021-04-17 1815  metroNIDAZOLE (FLAGYL) IVPB 500 mg        500 mg 100 mL/hr over 60 Minutes Intravenous  Once 2021/04/17 1805 2021-04-17 2049   04/17/21 1815  ceFEPIme (MAXIPIME) 2 g in sodium chloride 0.9 % 100 mL IVPB        2 g 200 mL/hr over 30 Minutes Intravenous  Once 04/17/21 1808 2021/04/17  2049   17-Apr-2021 1800  vancomycin (VANCOCIN) IVPB 1000 mg/200 mL premix        1,000 mg 200 mL/hr over 60 Minutes Intravenous  Once 04/17/21 1752 04-17-21 1951   04-17-21 1515  cefTRIAXone (ROCEPHIN) 1 g in sodium chloride 0.9 % 100 mL IVPB  Status:  Discontinued        1 g 200 mL/hr over 30 Minutes Intravenous Every 24 hours 2021/04/17 1513 2021/04/17 1808        Subjective:  Patient noted to be awake.  Confused.  Unable to answer questions.  Objective:   Vitals:   04/02/21 2355 04/03/21 0411 04/03/21 0424 04/03/21 0759  BP: (!) 156/97 (!) 157/77  (!) 160/96  Pulse: 66 87  93  Resp: 18 18  18   Temp: 98.6 F (37 C) 98.7 F (37.1 C)  98 F (36.7 C)  TempSrc: Oral Oral    SpO2:  98%  99%  Weight:   62.3 kg   Height:        General appearance: Awake alert.  In no distress.  Distracted Resp: Clear to auscultation bilaterally.  Normal effort Cardio: S1-S2 is normal regular.  No S3-S4.  No rubs murmurs or bruit GI: Abdomen is soft.  Nontender nondistended.  Bowel sounds are present normal.  No masses organomegaly Extremities: No edema.   Neurologic: Noted to be awake.  Disoriented.  No obvious focal deficits noted.      Data Reviewed:   I have personally reviewed following labs and imaging studies   CBC: Recent Labs  Lab Apr 17, 2021 1406 03/28/21 0349 03/29/21 0536 03/30/21 1659  WBC 14.7* 13.5* 11.7* 9.6  NEUTROABS 12.2*  --  8.3*  --   HGB 15.8 13.2 13.3 13.6  HCT 48.1 40.7 39.6 40.4  MCV 99.0 101.2* 97.8 96.4  PLT 184 143* 134* 782    Basic Metabolic Panel: Recent Labs  Lab 03/31/21 1854 04/01/21 0447 04/01/21 1633 04/02/21 0833 04/03/21 0144  NA  --  139  --  137 136  K  --  3.7  --  4.0 4.2  CL  --  107  --  104 104  CO2  --  28  --  30 30  GLUCOSE  --  286*  --  184* 222*  BUN  --  14  --  13 16  CREATININE  --  0.85  --  0.68 0.70  CALCIUM  --  9.5  --  9.1 9.3  MG 2.0 1.9 1.9 1.9  --   PHOS 1.5* 1.9* 2.5  --   --     Liver Function  Tests: Recent Labs  Lab 04/12/2021 1406 03/29/21 0633 04/01/21 0447  AST 16 14* 35  ALT 14 13 40  ALKPHOS 134* 97 115  BILITOT 0.5 0.3 0.5  PROT 6.0* 4.5* 4.3*  ALBUMIN 2.6* 2.0* 2.0*    CBG: Recent Labs  Lab 04/02/21 2354 04/03/21 0410 04/03/21 0800  GLUCAP 197* 261* 169*    Microbiology Studies:   Recent Results (from the past 240 hour(s))  Culture, blood (Routine x 2)     Status: None   Collection Time: 04/11/2021  2:06 PM   Specimen: BLOOD  Result Value Ref Range Status   Specimen Description   Final    BLOOD LEFT ANTECUBITAL Performed at [facility], [place] [address] [place], [place] [address]    Special Requests   Final    BOTTLES DRAWN AEROBIC AND ANAEROBIC Blood Culture adequate volume   Culture   Final    NO GROWTH 5 DAYS Performed at [facility], [place] [address] [place], [place] [address]    Report Status 04/01/2021 FINAL  Final  Culture, blood (Routine x 2)     Status: None   Collection Time: 04/19/2021  2:12 PM   Specimen: Site Not Specified; Blood  Result Value Ref Range Status   Specimen Description   Final    SITE NOT SPECIFIED Performed at [facility] [address], [place], [place] [address]    Special Requests   Final    BOTTLES DRAWN AEROBIC AND ANAEROBIC Blood Culture adequate volume   Culture   Final    NO GROWTH 5 DAYS Performed at [facility], [place] [address], [place], [place] [address]    Report Status 04/01/2021 FINAL  Final  Urine culture     Status: None   Collection Time: 04/20/2021  2:14 PM   Specimen: Urine, Random  Result Value Ref Range Status   Specimen Description   Final    URINE, RANDOM Performed at [facility] [address], [place], [place] [address]    Special Requests   Final    NONE Performed at [facility], [place] [address] [place], [place] [address]    Culture   Final    NO GROWTH Performed at [facility],  [place] [address], [place],  [address]    Report Status 03/28/2021 FINAL  Final  Resp Panel by RT-PCR (Flu A&B, Covid) Nasopharyngeal Swab     Status: None   Collection Time: 04/08/2021  3:15 PM   Specimen: Nasopharyngeal Swab; Nasopharyngeal(NP) swabs in vial transport medium  Result Value Ref Range Status   SARS Coronavirus 2 by RT PCR NEGATIVE NEGATIVE Final    Comment: (NOTE) SARS-CoV-2 target nucleic acids are NOT DETECTED.  The SARS-CoV-2 RNA is generally detectable in upper respiratory specimens during the acute phase of infection. The lowest concentration of SARS-CoV-2 viral copies this assay can detect is 138 copies/mL. A negative result does not preclude SARS-Cov-2 infection and should not be used as the sole basis for treatment or other patient management decisions. A negative result may occur with  improper specimen collection/handling, submission of specimen other than  nasopharyngeal swab, presence of viral mutation(s) within the areas targeted by this assay, and inadequate number of viral copies(<138 copies/mL). A negative result must be combined with clinical observations, patient history, and epidemiological information. The expected result is Negative.  Fact Sheet for Patients:  EntrepreneurPulse.com.au  Fact Sheet for Healthcare Providers:  IncredibleEmployment.be  This test is no t yet approved or cleared by the [place] FDA and  has been authorized for detection and/or diagnosis of SARS-CoV-2 by FDA under an Emergency Use Authorization (EUA). This EUA will remain  in effect (meaning this test can be used) for the duration of the COVID-19 declaration under Section 564(b)(1) of the Act, 21 U.S.C.section 360bbb-3(b)(1), unless the authorization is terminated  or revoked sooner.       Influenza A by PCR NEGATIVE NEGATIVE Final   Influenza B by PCR NEGATIVE NEGATIVE Final    Comment: (NOTE) The Xpert Xpress  SARS-CoV-2/FLU/RSV plus assay is intended as an aid in the diagnosis of influenza from Nasopharyngeal swab specimens and should not be used as a sole basis for treatment. Nasal washings and aspirates are unacceptable for Xpert Xpress SARS-CoV-2/FLU/RSV testing.  Fact Sheet for Patients: EntrepreneurPulse.com.au  Fact Sheet for Healthcare Providers: IncredibleEmployment.be  This test is not yet approved or cleared by the [place] FDA and has been authorized for detection and/or diagnosis of SARS-CoV-2 by FDA under an Emergency Use Authorization (EUA). This EUA will remain in effect (meaning this test can be used) for the duration of the COVID-19 declaration under Section 564(b)(1) of the Act, 21 U.S.C. section 360bbb-3(b)(1), unless the authorization is terminated or revoked.  Performed at [facility], [place] [address] [place], [place] [address]   CSF culture w Stat Gram Stain     Status: None   Collection Time: 03/30/21  1:48 PM   Specimen: CSF; Cerebrospinal Fluid  Result Value Ref Range Status   Specimen Description CSF  Final   Special Requests NONE  Final   Gram Stain NO WBC SEEN NO ORGANISMS SEEN CYTOSPIN SMEAR   Final   Culture   Final    NO GROWTH 3 DAYS Performed at [facility], [place] [address] [place], [place] [address]    Report Status 04/02/2021 FINAL  Final     Radiology Studies:  MR BRAIN WO CONTRAST  Result Date: 04/01/2021 CLINICAL DATA:  Shunted hydrocephalus, altered mental status EXAM: MRI HEAD WITHOUT CONTRAST TECHNIQUE: Multiplanar, multiecho pulse sequences of the brain and surrounding structures were obtained without intravenous contrast. COMPARISON:  October 2018 FINDINGS: There is susceptibility artifact from shunt apparatus. Adjacent parenchyma is variably obscured or distorted depending on sequence. Findings below are within this limitation. Brain: Small area of mildly reduced  diffusion involving the left caudate head. There is susceptibility hypointensity in this region likely reflecting chronic blood products and diffusion hyperintensity is probably artifactual. Right posterior approach shunt catheter enters the right lateral ventricle with tip within the body along the septum pellucidum. Ventricle caliber has minimally increased compared to 2018. There are thin bilateral subdural collections measuring up to 4-5 mm in thickness bilaterally. No mass effect. Patchy and confluent areas of T2 hyperintensity in the supratentorial white matter are nonspecific but probably reflect mild to moderate chronic microvascular ischemic changes. Prominence of the ventricles and sulci reflects generalized parenchymal volume loss. There is no intracranial mass, mass effect, or edema. Vascular: Major vessel flow voids at the skull base are preserved. Skull and upper cervical spine: Normal marrow signal is  preserved. Sinuses/Orbits: Mild mucosal thickening. Bilateral lens replacements. Other: Sella is unremarkable. Persistent mastoid effusions. Decrease in size of small T2 hyperintense right parotid lesion. IMPRESSION: No acute infarction. Subacute to chronic infarct of the left caudate head with chronic blood products. Thin bilateral subdural collections without mass effect. Correlate with recent change in shunt settings. Ventricle caliber is minimally increased compared to 2018 MRI. Chronic microvascular ischemic changes. Electronically Signed   By: Macy Mis M.D.   On: 04/01/2021 12:52     Scheduled Meds:   . Chlorhexidine Gluconate Cloth  6 each Topical Daily  . cyanocobalamin  1,000 mcg Intramuscular Q30 days  . feeding supplement (PROSource TF)  45 mL Per Tube Daily  . folic acid  1 mg Intravenous Daily  . insulin aspart  0-6 Units Subcutaneous Q4H  . insulin glargine  10 Units Subcutaneous Daily  . thiamine injection  100 mg Intravenous Daily    Continuous Infusions:   .  feeding supplement (OSMOLITE 1.5 CAL) 1,000 mL (04/02/21 1439)     LOS: 7 days     Bonnielee Haff, [facility]    To contact the attending provider between 7A-7P or the covering provider during after hours 7P-7A, please log into the web site [email or web address] and access using universal [place] password for that web site. If you do not have the password, please call the hospital operator.  04/03/2021, 9:45 AM

## 2021-04-04 DIAGNOSIS — I10 Essential (primary) hypertension: Secondary | ICD-10-CM | POA: Diagnosis not present

## 2021-04-04 DIAGNOSIS — E119 Type 2 diabetes mellitus without complications: Secondary | ICD-10-CM | POA: Diagnosis not present

## 2021-04-04 LAB — GLUCOSE, CAPILLARY
Glucose-Capillary: 203 mg/dL — ABNORMAL HIGH (ref 70–99)
Glucose-Capillary: 231 mg/dL — ABNORMAL HIGH (ref 70–99)
Glucose-Capillary: 161 mg/dL — ABNORMAL HIGH (ref 70–99)
Glucose-Capillary: 131 mg/dL — ABNORMAL HIGH (ref 70–99)
Glucose-Capillary: 183 mg/dL — ABNORMAL HIGH (ref 70–99)

## 2021-04-04 LAB — MAGNESIUM: Magnesium: 2 mg/dL (ref 1.7–2.4)

## 2021-04-04 LAB — BASIC METABOLIC PANEL
Anion gap: 6 (ref 5–15)
BUN: 16 mg/dL (ref 8–23)
CO2: 29 mmol/L (ref 22–32)
Calcium: 9.1 mg/dL (ref 8.9–10.3)
Chloride: 102 mmol/L (ref 98–111)
Creatinine, Ser: 0.72 mg/dL (ref 0.61–1.24)
GFR, Estimated: >60 mL/min (ref >60)
Glucose, Bld: 193 mg/dL — ABNORMAL HIGH (ref 70–99)
Potassium: 4.6 mmol/L (ref 3.5–5.1)
Sodium: 137 mmol/L (ref 135–145)

## 2021-04-04 MED ORDER — INSULIN GLARGINE 100 UNIT/ML ~~LOC~~ SOLN
4.0000 [IU] | Freq: Once | SUBCUTANEOUS | Status: AC
  Administered 2021-04-04: 4 [IU] via SUBCUTANEOUS
  Filled 2021-04-04: qty 0.04

## 2021-04-04 MED ORDER — INSULIN GLARGINE 100 UNIT/ML ~~LOC~~ SOLN
14.0000 [IU] | Freq: Every day | SUBCUTANEOUS | Status: DC
  Administered 2021-04-05 – 2021-04-07 (×3): 14 [IU] via SUBCUTANEOUS
  Filled 2021-04-04 (×3): qty 0.14

## 2021-04-04 NOTE — Progress Notes (Signed)
PROGRESS NOTE   Robert Gross  IRS:[id number]    DOB: 1929/05/17    DOA: 04/12/2021  PCP: [name], MD    Chief Complaint  Patient presents with  . Altered Mental Status    Brief Narrative:  87 year old male with medical history significant for but not limited to communicating hydrocephalus s/p VP shunt, family reported dementia, hypertension, DM2, BPH, GERD, recent CVA, hyperlipidemia, presented with worsening confusion, weakness and poor oral intake.  He was reportedly doing well about 6 weeks ago, then sometime in March he was diagnosed with an ear infection and started on antibiotics and 2 weeks of prednisone.  During the steroid course, he had hypertension and hyperglycemia up to the 200s.  Once the prednisone was stopped, there was a sharp decline in his health and progressive weakness.  CT head for shunt follow-up 4/5 showed new subacute infarct.  Week prior to admission, he was also diagnosed with yeast UTI and started on fluconazole.  Seen by neurosurgery and VP shunt readjusted on 03/24/2021.  He progressively worsened, unable to get out of bed, difficulty eating.  Admitted initially for acute metabolic encephalopathy, hypernatremia, hypercalcemia, dehydration, suspected UTI.  He had complained of neck stiffness for 2 weeks at home, due to concern for CNS infection, empiric meningitis coverage was started and patient was transferred from [facility] to [facility] for further evaluation and management.  No acyclovir started due to low suspicion for herpes encephalitis.  Neurology and neurosurgery were consulted.  Patient underwent LP 5/9.  Ongoing AMS with unsafe oral intake.  Family opted for [facility] tube placement and tube feeds.   Assessment & Plan:   Acute metabolic encephalopathy Decline in mental status started approximately 6 weeks PTA with a more rapid decline over the past 2 weeks PTA when he became nonverbal and increasingly lethargic.  Over the past 6 weeks, he was treated  for an ear infection with cefdinir and a 12-day course of prednisone, UTI with Cipro and fluconazole and his VP shunt was readjusted by neurosurgery.   Patient underwent CT scan of the head which showed mildly dilated lateral ventricles.  No acute findings noted.  EEG did not show any epileptiform activity.   Vitamin B12 level was 356.  B1 level is pending.  TSH 4.6.  Free T4 0.97.  Ammonia 28.  Urine culture without growth.  Blood cultures negative.  CSF work-up was unremarkable.  Antibiotics were discontinued. Patient remains on IV thiamine.  MRI brain was done which showed subacute to chronic infarct.  No acute findings noted.  Shunt setting was adjusted by neurosurgery on 5/11. Patient mentation continues to wax and wane.   Per neurology there is no further testing or interventions to be done at this time.  Folic acid deficiency Folate level noted to be 4.0.  Started on supplementation.  Neck rigidity/possible torticollis Evaluation negative for meningitis.  Symptomatic treatment with heating pads, topical menthol and Tylenol.  Avoid opiates or sedatives.   History of communicating hydrocephalus s/p VP shunt Neurosurgery had been following.  As per neurosurgery, he developed bilateral subdural fluid collections necessitating adjustment of his shunt valve pressure higher.  His subdural collections essentially resolved but he continues to have mild ventriculomegaly.   Shunt valve was adjusted again on 5/11 to maximize flow.  AKI, mild Resolved.   Dehydration with hypernatremia: Serum sodium 150 on admission, likely related to poor oral intake.  Resolved.  Hypercalcemia Noted to have serum calcium of 11.5.  Resolved.  Hypokalemia/hypomagnesemia/hypophosphatemia Monitor electrolytes closely.  Magnesium and potassium noted to be normal today.  Dysphagia Unsafe oral intake due to AMS.  Speech therapy recommended NPO and alternate means of nutrition.  After discussions with family core  track feeding tube was placed and the patient was started on tube feedings. Speech therapy has been following however due to his waxing and waning mental status they continue to recommend n.p.o. status.    Type II DM CBGs were noted to be elevated due to his tube feedings.  Started on Lantus.  Continue SSI.  HbA1c 9.3.  We will adjust the dose of Lantus for better control.  Questionable CVA Per CT head 03/16/2021: Stable appearance of hypodensity seen in the left anterior basal ganglia consistent with subacute infarction.  MRI brain shows subacute to chronic infarct.  No acute findings noted.  Essential hypertension Lisinopril on hold.  Due to elevated blood pressure as amlodipine was initiated.  Seems to be better today.  Continue to monitor.    Thrombocytopenia: Unclear etiology. Resolved.  Adult failure to thrive Likely multifactorial.    Urinary retention Had indwelling Foley catheter which was removed.  Monitor for retention.  Goals of care Despite maximal treatment patient has not shown significant improvement.  Mentation has been waxing and waning.  It appears that there is no further plans for intervention by neurosurgery or neurology.  Discussed with daughter yesterday and was told that all of this could reflect that the patient is declining.  Palliative care was consulted.  They will have a family discussion tomorrow.   Prognosis is poor due to his inability to take oral nutrition.     DVT prophylaxis: Place and maintain sequential compression device Start: 03/29/21 0749     Code Status: DNR Family Communication: No family at bedside.   Disposition: SNF recommended by PT.  Status is: Inpatient  Remains inpatient appropriate because:Inpatient level of care appropriate due to severity of illness   Dispo: The patient is from: Home              Anticipated d/c is to: TBD              Patient currently is not medically stable to d/c.   Difficult to place patient  No        Consultants:   Neurosurgery Neurology  Procedures:   LP by neurology 5/9.  Antimicrobials:    Anti-infectives (From admission, onward)   Start     Dose/Rate Route Frequency Ordered Stop   03/28/21 2200  cefTRIAXone (ROCEPHIN) 2 g in sodium chloride 0.9 % 100 mL IVPB  Status:  Discontinued        2 g 200 mL/hr over 30 Minutes Intravenous Every 12 hours 03/28/21 1244 03/31/21 0732   03/28/21 1800  vancomycin (VANCOREADY) IVPB 1000 mg/200 mL  Status:  Discontinued        1,000 mg 200 mL/hr over 60 Minutes Intravenous Every 24 hours 03/28/21 0006 03/28/21 0757   03/28/21 1600  vancomycin (VANCOREADY) IVPB 500 mg/100 mL  Status:  Discontinued        500 mg 100 mL/hr over 60 Minutes Intravenous Every 12 hours 03/28/21 1457 03/31/21 0732   03/28/21 1400  fluconazole (DIFLUCAN) IVPB 100 mg  Status:  Discontinued        100 mg 50 mL/hr over 60 Minutes Intravenous Every 24 hours 03/28/21 1239 03/30/21 1639   03/28/21 1330  vancomycin (VANCOREADY) IVPB 1000 mg/200 mL  Status:  Discontinued  1,000 mg 200 mL/hr over 60 Minutes Intravenous  Once 03/28/21 1244 03/28/21 1259   03/28/21 1300  ampicillin (OMNIPEN) 2 g in sodium chloride 0.9 % 100 mL IVPB  Status:  Discontinued        2 g 300 mL/hr over 20 Minutes Intravenous Every 6 hours 03/28/21 1244 03/31/21 0732   03/28/21 1000  fluconazole (DIFLUCAN) tablet 100 mg  Status:  Discontinued        100 mg Oral Daily 04/16/2021 2341 03/28/21 1239   03/28/21 0800  ceFEPIme (MAXIPIME) 2 g in sodium chloride 0.9 % 100 mL IVPB  Status:  Discontinued        2 g 200 mL/hr over 30 Minutes Intravenous Every 12 hours 03/28/21 0006 03/28/21 1244   03/28/21 0400  metroNIDAZOLE (FLAGYL) IVPB 500 mg  Status:  Discontinued        500 mg 100 mL/hr over 60 Minutes Intravenous Every 8 hours 04/16/2021 2346 03/28/21 0757   04/15/2021 1815  metroNIDAZOLE (FLAGYL) IVPB 500 mg        500 mg 100 mL/hr over 60 Minutes Intravenous  Once 03/26/2021 1805  04/04/2021 2049   04/03/2021 1815  ceFEPIme (MAXIPIME) 2 g in sodium chloride 0.9 % 100 mL IVPB        2 g 200 mL/hr over 30 Minutes Intravenous  Once 03/22/2021 1808 03/28/2021 2049   03/31/2021 1800  vancomycin (VANCOCIN) IVPB 1000 mg/200 mL premix        1,000 mg 200 mL/hr over 60 Minutes Intravenous  Once 04/17/2021 1752 03/24/2021 1951   04/12/2021 1515  cefTRIAXone (ROCEPHIN) 1 g in sodium chloride 0.9 % 100 mL IVPB  Status:  Discontinued        1 g 200 mL/hr over 30 Minutes Intravenous Every 24 hours 04/06/2021 1513 04/15/2021 1808        Subjective:  Patient awake but confused and distracted.  Unable to answer any questions.    Objective:   Vitals:   04/03/21 1933 04/03/21 2335 04/04/21 0309 04/04/21 0810  BP: (!) 171/51 (!) 149/71 (!) 163/42 (!) 120/93  Pulse: 60 84 66 91  Resp: 18 18 18 20   Temp: 98.2 F (36.8 C) (!) 97.5 F (36.4 C) (!) 97.5 F (36.4 C) (!) 97.4 F (36.3 C)  TempSrc: Axillary Oral Oral Oral  SpO2: 96% 98% 97% 96%  Weight:      Height:        General appearance: Awake alert.  In no distress. distracted. Resp: Coarse breath sounds bilaterally.  Normal effort.  No wheezing or rhonchi. Cardio: S1-S2 is normal regular.  No S3-S4.  No rubs murmurs or bruit GI: Abdomen is soft.  Nontender nondistended.  Bowel sounds are present normal.  No masses organomegaly Extremities: No edema.   Neurologic: Disoriented.  No obvious focal deficits.     Data Reviewed:   I have personally reviewed following labs and imaging studies   CBC: Recent Labs  Lab 03/29/21 0536 03/30/21 1659  WBC 11.7* 9.6  NEUTROABS 8.3*  --   HGB 13.3 13.6  HCT 39.6 40.4  MCV 97.8 96.4  PLT 134* [id number]    Basic Metabolic Panel: Recent Labs  Lab 03/31/21 1854 04/01/21 0447 04/01/21 1633 04/02/21 0833 04/03/21 0144 04/04/21 0546  NA  --  139  --  137 136 137  K  --  3.7  --  4.0 4.2 4.6  CL  --  107  --  104 104 102  CO2  --  28  --  30 30 29   GLUCOSE  --  286*  --  184* 222* 193*   BUN  --  14  --  13 16 16   CREATININE  --  0.85  --  0.68 0.70 0.72  CALCIUM  --  9.5  --  9.1 9.3 9.1  MG 2.0 1.9 1.9 1.9  --  2.0  PHOS 1.5* 1.9* 2.5  --   --   --     Liver Function Tests: Recent Labs  Lab 03/29/21 0633 04/01/21 0447  AST 14* 35  ALT 13 40  ALKPHOS 97 115  BILITOT 0.3 0.5  PROT 4.5* 4.3*  ALBUMIN 2.0* 2.0*    CBG: Recent Labs  Lab 04/03/21 2355 04/04/21 0310 04/04/21 0812  GLUCAP 192* 203* 231*    Microbiology Studies:   Recent Results (from the past 240 hour(s))  Culture, blood (Routine x 2)     Status: None   Collection Time: 04/01/2021  2:06 PM   Specimen: BLOOD  Result Value Ref Range Status   Specimen Description   Final    BLOOD LEFT ANTECUBITAL Performed at [facility], [place] [address], [place], [place] [address]    Special Requests   Final    BOTTLES DRAWN AEROBIC AND ANAEROBIC Blood Culture adequate volume   Culture   Final    NO GROWTH 5 DAYS Performed at [facility], [place] [address] [place], [place] [address]    Report Status 04/01/2021 FINAL  Final  Culture, blood (Routine x 2)     Status: None   Collection Time: 04/16/2021  2:12 PM   Specimen: Site Not Specified; Blood  Result Value Ref Range Status   Specimen Description   Final    SITE NOT SPECIFIED Performed at [place] [address], [place], [place] [address]    Special Requests   Final    BOTTLES DRAWN AEROBIC AND ANAEROBIC Blood Culture adequate volume   Culture   Final    NO GROWTH 5 DAYS Performed at [facility], [place] [address], [place], [place] [address]    Report Status 04/01/2021 FINAL  Final  Urine culture     Status: None   Collection Time: 04/10/2021  2:14 PM   Specimen: Urine, Random  Result Value Ref Range Status   Specimen Description   Final    URINE, RANDOM Performed at [place] [address] [place], [place] [address]    Special Requests   Final    NONE Performed  at [facility], [place] [address] [place], [place] [address]    Culture   Final    NO GROWTH Performed at [facility], [place] [address], [place],  [address]    Report Status 03/28/2021 FINAL  Final  Resp Panel by RT-PCR (Flu A&B, Covid) Nasopharyngeal Swab     Status: None   Collection Time: 04/17/2021  3:15 PM   Specimen: Nasopharyngeal Swab; Nasopharyngeal(NP) swabs in vial transport medium  Result Value Ref Range Status   SARS Coronavirus 2 by RT PCR NEGATIVE NEGATIVE Final    Comment: (NOTE) SARS-CoV-2 target nucleic acids are NOT DETECTED.  The SARS-CoV-2 RNA is generally detectable in upper respiratory specimens during the acute phase of infection. The lowest concentration of SARS-CoV-2 viral copies this assay can detect is 138 copies/mL. A negative result does not preclude SARS-Cov-2 infection and should not be used as the sole  basis for treatment or other patient management decisions. A negative result may occur with  improper specimen collection/handling, submission of specimen other than nasopharyngeal swab, presence of viral mutation(s) within the areas targeted by this assay, and inadequate number of viral copies(<138 copies/mL). A negative result must be combined with clinical observations, patient history, and epidemiological information. The expected result is Negative.  Fact Sheet for Patients:  EntrepreneurPulse.com.au  Fact Sheet for Healthcare Providers:  IncredibleEmployment.be  This test is no t yet approved or cleared by the [place] FDA and  has been authorized for detection and/or diagnosis of SARS-CoV-2 by FDA under an Emergency Use Authorization (EUA). This EUA will remain  in effect (meaning this test can be used) for the duration of the COVID-19 declaration under Section 564(b)(1) of the Act, 21 U.S.C.section 360bbb-3(b)(1), unless the authorization is terminated  or revoked sooner.        Influenza A by PCR NEGATIVE NEGATIVE Final   Influenza B by PCR NEGATIVE NEGATIVE Final    Comment: (NOTE) The Xpert Xpress SARS-CoV-2/FLU/RSV plus assay is intended as an aid in the diagnosis of influenza from Nasopharyngeal swab specimens and should not be used as a sole basis for treatment. Nasal washings and aspirates are unacceptable for Xpert Xpress SARS-CoV-2/FLU/RSV testing.  Fact Sheet for Patients: EntrepreneurPulse.com.au  Fact Sheet for Healthcare Providers: IncredibleEmployment.be  This test is not yet approved or cleared by the [place] FDA and has been authorized for detection and/or diagnosis of SARS-CoV-2 by FDA under an Emergency Use Authorization (EUA). This EUA will remain in effect (meaning this test can be used) for the duration of the COVID-19 declaration under Section 564(b)(1) of the Act, 21 U.S.C. section 360bbb-3(b)(1), unless the authorization is terminated or revoked.  Performed at [facility], [place] [address], [place], [place] [address]   CSF culture w Stat Gram Stain     Status: None   Collection Time: 03/30/21  1:48 PM   Specimen: CSF; Cerebrospinal Fluid  Result Value Ref Range Status   Specimen Description CSF  Final   Special Requests NONE  Final   Gram Stain NO WBC SEEN NO ORGANISMS SEEN CYTOSPIN SMEAR   Final   Culture   Final    NO GROWTH 3 DAYS Performed at [facility], [place] [address] [place], [place] [address]    Report Status 04/02/2021 FINAL  Final     Radiology Studies:  No results found.   Scheduled Meds:   . amLODipine  5 mg Per Tube Daily  . Chlorhexidine Gluconate Cloth  6 each Topical Daily  . cyanocobalamin  1,000 mcg Intramuscular Q30 days  . feeding supplement (PROSource TF)  45 mL Per Tube Daily  . folic acid  1 mg Intravenous Daily  . insulin aspart  0-6 Units Subcutaneous Q4H  . insulin glargine  10 Units Subcutaneous Daily  .  thiamine injection  100 mg Intravenous Daily    Continuous Infusions:   . feeding supplement (OSMOLITE 1.5 CAL) 50 mL/hr at 04/03/21 2000     LOS: 8 days     Bonnielee Haff, [facility]    To contact the attending provider between 7A-7P or the covering provider during after hours 7P-7A, please log into the web site [email or web address] and access using universal [place] password for that web site. If you do not have the password, please call the hospital operator.  04/04/2021, 10:24 AM

## 2021-04-05 DIAGNOSIS — Z515 Encounter for palliative care: Secondary | ICD-10-CM | POA: Diagnosis not present

## 2021-04-05 DIAGNOSIS — Z7189 Other specified counseling: Secondary | ICD-10-CM | POA: Diagnosis not present

## 2021-04-05 DIAGNOSIS — I10 Essential (primary) hypertension: Secondary | ICD-10-CM | POA: Diagnosis not present

## 2021-04-05 DIAGNOSIS — G9341 Metabolic encephalopathy: Secondary | ICD-10-CM | POA: Diagnosis not present

## 2021-04-05 DIAGNOSIS — G910 Communicating hydrocephalus: Secondary | ICD-10-CM | POA: Diagnosis not present

## 2021-04-05 DIAGNOSIS — E119 Type 2 diabetes mellitus without complications: Secondary | ICD-10-CM | POA: Diagnosis not present

## 2021-04-05 LAB — GLUCOSE, CAPILLARY
Glucose-Capillary: 171 mg/dL — ABNORMAL HIGH (ref 70–99)
Glucose-Capillary: 197 mg/dL — ABNORMAL HIGH (ref 70–99)
Glucose-Capillary: 204 mg/dL — ABNORMAL HIGH (ref 70–99)
Glucose-Capillary: 224 mg/dL — ABNORMAL HIGH (ref 70–99)
Glucose-Capillary: 248 mg/dL — ABNORMAL HIGH (ref 70–99)
Glucose-Capillary: 252 mg/dL — ABNORMAL HIGH (ref 70–99)
Glucose-Capillary: 288 mg/dL — ABNORMAL HIGH (ref 70–99)

## 2021-04-05 LAB — BASIC METABOLIC PANEL
Anion gap: 4 — ABNORMAL LOW (ref 5–15)
BUN: 19 mg/dL (ref 8–23)
CO2: 30 mmol/L (ref 22–32)
Calcium: 9.5 mg/dL (ref 8.9–10.3)
Chloride: 102 mmol/L (ref 98–111)
Creatinine, Ser: 0.69 mg/dL (ref 0.61–1.24)
GFR, Estimated: >60 mL/min (ref >60)
Glucose, Bld: 240 mg/dL — ABNORMAL HIGH (ref 70–99)
Potassium: 4.6 mmol/L (ref 3.5–5.1)
Sodium: 136 mmol/L (ref 135–145)

## 2021-04-05 MED ORDER — AMLODIPINE BESYLATE 5 MG PO TABS
5.0000 mg | ORAL_TABLET | Freq: Once | ORAL | Status: AC
  Administered 2021-04-05: 5 mg
  Filled 2021-04-05: qty 1

## 2021-04-05 MED ORDER — AMLODIPINE BESYLATE 10 MG PO TABS
10.0000 mg | ORAL_TABLET | Freq: Every day | ORAL | Status: DC
  Administered 2021-04-06 – 2021-04-07 (×2): 10 mg
  Filled 2021-04-05 (×2): qty 1

## 2021-04-05 NOTE — Progress Notes (Signed)
Palliative:  HPI: 87 y.o. male  with past medical history of hydrocephalus s/p VP shunt, hypertension, diabetes, GERD, vocal cord paralysis, hypercalcemia, BPH, recent stroke, dementia (per family report) admitted on 03/28/2021 with altered mental status. Multiple health complications and infections over the 6 weeks prior to admission with progressing physical decline, weakness, and poor intake. Ongoing encephalopathy and not alert enough for po intake. Palliative care requested to assist with goals of care in setting of poor progress and poor prognosis.   I met today with Robert Gross wife and 2 daughters. Robert Gross is much unchanged since I saw him on Thursday. He still has poor responsiveness and continued altered mental status. Family today express frustration that Robert Gross was walking and going out to dinner and doing very well just prior to admission. They struggle with how significant this decline has been for him. He has been extremely ill in the past but has always recovered well. We discussed how his illnesses over the past months and even years has weighed on him and taken more and more out of him even though he seemingly bounced back well. They fully understand that if he is unable to eat/drink then he cannot survive long and they do not want long term feeding tube. They do struggle with him not getting nutrition. We did discuss the option of providing comfort feeds and allowing him to have food/drink if desired and then they can see how he responds and if he is able or even wants any intake. At this time they wish to continue Cortrak, artificial feeding, and current interventions. They do understand that Cortrak is a temporary measures.   We discussed next steps and all agree that most likely scenario is that he will not eat/drink enough due to aspiration risk and decreased mentation. We discussed best option at this time would be for transition to hospice facility for end of life care in the  setting of no further reversible causes to decline. They all agree that this is most likely the next step but they are not ready for this step yet. We discussed trial of comfort feeds and I encouraged them to be at bedside with him as much as possible. We will meet again Tuesday 04/07/21 1000 am to reassess his intake and potential hospice.   All questions/concerns addressed. Emotional support provided.   Exam: Elderly, frail, cachectic. Eyes open but otherwise unresponsive. Does not speak or follow commands. No distress. Breathing regular, unlabored. Cortrak in place. Abd soft.   Plan: - Will begin comfort feeds. Family want trial to see for themselves how he does with foods he likes.  - We will have repeat family meeting 04/07/21 1000 am to reassess goals of care. I will continue to follow and intervene sooner if change in status.   2060-1561 50 min  Vinie Sill, NP Palliative Medicine Team Pager [phone] (Please see [email or web address] for schedule) Team Phone [phone]    Greater than 50%  of this time was spent counseling and coordinating care related to the above assessment and plan

## 2021-04-05 NOTE — Plan of Care (Signed)
  Problem: Education: Goal: Knowledge of General Education information will improve Description: Including pain rating scale, medication(s)/side effects and non-pharmacologic comfort measures Outcome: Progressing   Problem: Health Behavior/Discharge Planning: Goal: Ability to manage health-related needs will improve Outcome: Progressing   Problem: Activity: Goal: Risk for activity intolerance will decrease Outcome: Progressing   Problem: Nutrition: Goal: Adequate nutrition will be maintained Outcome: Progressing   Problem: Elimination: Goal: Will not experience complications related to bowel motility Outcome: Progressing Goal: Will not experience complications related to urinary retention Outcome: Progressing   Problem: Pain Managment: Goal: General experience of comfort will improve Outcome: Progressing   Problem: Safety: Goal: Ability to remain free from injury will improve Outcome: Progressing   Problem: Skin Integrity: Goal: Risk for impaired skin integrity will decrease Outcome: Progressing   

## 2021-04-05 NOTE — Progress Notes (Signed)
PROGRESS NOTE   Robert Gross  DZH:[id number]    DOB: 1929/10/10    DOA: 2021-04-24  PCP: [name], MD    Chief Complaint  Patient presents with  . Altered Mental Status    Brief Narrative:  87 year old male with medical history significant for but not limited to communicating hydrocephalus s/p VP shunt, family reported dementia, hypertension, DM2, BPH, GERD, recent CVA, hyperlipidemia, presented with worsening confusion, weakness and poor oral intake.  He was reportedly doing well about 6 weeks ago, then sometime in March he was diagnosed with an ear infection and started on antibiotics and 2 weeks of prednisone.  During the steroid course, he had hypertension and hyperglycemia up to the 200s.  Once the prednisone was stopped, there was a sharp decline in his health and progressive weakness.  CT head for shunt follow-up 4/5 showed new subacute infarct.  Week prior to admission, he was also diagnosed with yeast UTI and started on fluconazole.  Seen by neurosurgery and VP shunt readjusted on 03/24/2021.  He progressively worsened, unable to get out of bed, difficulty eating.  Admitted initially for acute metabolic encephalopathy, hypernatremia, hypercalcemia, dehydration, suspected UTI.  He had complained of neck stiffness for 2 weeks at home, due to concern for CNS infection, empiric meningitis coverage was started and patient was transferred from [facility] to [facility] for further evaluation and management.  No acyclovir started due to low suspicion for herpes encephalitis.  Neurology and neurosurgery were consulted.  Patient underwent LP 5/9.  Ongoing AMS with unsafe oral intake.  Family opted for [facility] tube placement and tube feeds.   Assessment & Plan:   Acute metabolic encephalopathy Decline in mental status started approximately 6 weeks PTA with a more rapid decline over the past 2 weeks PTA when he became nonverbal and increasingly lethargic.  Over the past 6 weeks, he was treated  for an ear infection with cefdinir and a 12-day course of prednisone, UTI with Cipro and fluconazole and his VP shunt was readjusted by neurosurgery.   Patient underwent CT scan of the head which showed mildly dilated lateral ventricles.  No acute findings noted.  EEG did not show any epileptiform activity.   Vitamin B12 level was 356.  B1 level is pending.  TSH 4.6.  Free T4 0.97.  Ammonia 28.  Urine culture without growth.  Blood cultures negative.  CSF work-up was unremarkable.  Antibiotics were discontinued. Patient remains on IV thiamine.  MRI brain was done which showed subacute to chronic infarct.  No acute findings noted.  Shunt setting was adjusted by neurosurgery on 5/11. Patient mentation continues to wax and wane.   Per neurology there is no further testing or interventions to be done at this time.  Neurology did discuss all the above with family members.  It remains unclear as to how long the patient may take to recover but patient has not shown much signs of recovery in the last several days.  He remains at very high risk for aspiration so he remains on tube feedings.  Palliative care consulted.  See below.  Folic acid deficiency Folate level noted to be 4.0.  Started on supplementation.  Neck rigidity/possible torticollis Evaluation negative for meningitis.  Symptomatic treatment with heating pads, topical menthol and Tylenol.  Avoid opiates or sedatives.   History of communicating hydrocephalus s/p VP shunt Neurosurgery had been following.  As per neurosurgery, he developed bilateral subdural fluid collections necessitating adjustment of his shunt valve pressure higher.  His subdural collections essentially resolved but he continues to have mild ventriculomegaly.   Shunt valve was adjusted again on 5/11 to maximize flow.  AKI, mild Resolved.   Dehydration with hypernatremia: Serum sodium 150 on admission, likely related to poor oral intake.  Resolved.  Hypercalcemia Noted to  have serum calcium of 11.5.  Resolved.    Hypokalemia/hypomagnesemia/hypophosphatemia Monitor electrolytes closely.  Magnesium and potassium noted to be normal today.  Dysphagia Unsafe oral intake due to AMS.  Speech therapy recommended NPO and alternate means of nutrition.  After discussions with family core track feeding tube was placed and the patient was started on tube feedings. Speech therapy has been following however due to his waxing and waning mental status they continue to recommend n.p.o. status.    Type II DM CBGs were noted to be elevated due to his tube feedings.  Started on Lantus.  Continue SSI.  HbA1c 9.3.  Lantus dose was adjusted.  Will not be too aggressive to avoid episodes of hypoglycemia.  Questionable CVA Per CT head 03/16/2021: Stable appearance of hypodensity seen in the left anterior basal ganglia consistent with subacute infarction.  MRI brain shows subacute to chronic infarct.  No acute findings noted.  Essential hypertension Lisinopril was placed on hold due to AKI.  Amlodipine was initiated.  Blood pressure better but still high.  Will increase the dose to 10 mg.    Thrombocytopenia: Unclear etiology. Resolved.  Adult failure to thrive Likely multifactorial.    Urinary retention Had indwelling Foley catheter which was removed.  Monitor for retention.  Goals of care Despite maximal treatment patient has not shown significant improvement.  Mentation has been waxing and waning.  It appears that there is no further plans for intervention by neurosurgery or neurology.  Unable to take orally due to high risk of aspiration per speech therapy.  All of this was discussed with patient's daughter.  Palliative care was consulted.  Plan is for a family meeting today.  Prognosis is poor due to his inability to take oral nutrition.     DVT prophylaxis: Place and maintain sequential compression device Start: 03/29/21 0749     Code Status: DNR Family Communication: No  family at bedside.   Disposition: SNF recommended by PT.  Status is: Inpatient  Remains inpatient appropriate because:Inpatient level of care appropriate due to severity of illness   Dispo: The patient is from: Home              Anticipated d/c is to: TBD              Patient currently is not medically stable to d/c.   Difficult to place patient No        Consultants:   Neurosurgery Neurology  Procedures:   LP by neurology 5/9.  Antimicrobials:    Anti-infectives (From admission, onward)   Start     Dose/Rate Route Frequency Ordered Stop   03/28/21 2200  cefTRIAXone (ROCEPHIN) 2 g in sodium chloride 0.9 % 100 mL IVPB  Status:  Discontinued        2 g 200 mL/hr over 30 Minutes Intravenous Every 12 hours 03/28/21 1244 03/31/21 0732   03/28/21 1800  vancomycin (VANCOREADY) IVPB 1000 mg/200 mL  Status:  Discontinued        1,000 mg 200 mL/hr over 60 Minutes Intravenous Every 24 hours 03/28/21 0006 03/28/21 0757   03/28/21 1600  vancomycin (VANCOREADY) IVPB 500 mg/100 mL  Status:  Discontinued  500 mg 100 mL/hr over 60 Minutes Intravenous Every 12 hours 03/28/21 1457 03/31/21 0732   03/28/21 1400  fluconazole (DIFLUCAN) IVPB 100 mg  Status:  Discontinued        100 mg 50 mL/hr over 60 Minutes Intravenous Every 24 hours 03/28/21 1239 03/30/21 1639   03/28/21 1330  vancomycin (VANCOREADY) IVPB 1000 mg/200 mL  Status:  Discontinued        1,000 mg 200 mL/hr over 60 Minutes Intravenous  Once 03/28/21 1244 03/28/21 1259   03/28/21 1300  ampicillin (OMNIPEN) 2 g in sodium chloride 0.9 % 100 mL IVPB  Status:  Discontinued        2 g 300 mL/hr over 20 Minutes Intravenous Every 6 hours 03/28/21 1244 03/31/21 0732   03/28/21 1000  fluconazole (DIFLUCAN) tablet 100 mg  Status:  Discontinued        100 mg Oral Daily 04/09/2021 2341 03/28/21 1239   03/28/21 0800  ceFEPIme (MAXIPIME) 2 g in sodium chloride 0.9 % 100 mL IVPB  Status:  Discontinued        2 g 200 mL/hr over 30  Minutes Intravenous Every 12 hours 03/28/21 0006 03/28/21 1244   03/28/21 0400  metroNIDAZOLE (FLAGYL) IVPB 500 mg  Status:  Discontinued        500 mg 100 mL/hr over 60 Minutes Intravenous Every 8 hours 03/25/2021 2346 03/28/21 0757   03/30/2021 1815  metroNIDAZOLE (FLAGYL) IVPB 500 mg        500 mg 100 mL/hr over 60 Minutes Intravenous  Once 04/04/2021 1805 04/16/2021 2049   04/17/2021 1815  ceFEPIme (MAXIPIME) 2 g in sodium chloride 0.9 % 100 mL IVPB        2 g 200 mL/hr over 30 Minutes Intravenous  Once 04/02/2021 1808 04/20/2021 2049   03/25/2021 1800  vancomycin (VANCOCIN) IVPB 1000 mg/200 mL premix        1,000 mg 200 mL/hr over 60 Minutes Intravenous  Once 04/10/2021 1752 04/21/2021 1951   03/28/2021 1515  cefTRIAXone (ROCEPHIN) 1 g in sodium chloride 0.9 % 100 mL IVPB  Status:  Discontinued        1 g 200 mL/hr over 30 Minutes Intravenous Every 24 hours 04/01/2021 1513 04/18/2021 1808        Subjective:  Patient noted to be awake alert.  Does not answer any questions..    Objective:   Vitals:   04/05/21 0321 04/05/21 0356 04/05/21 0500 04/05/21 0715  BP: (!) 162/56   (!) 153/62  Pulse:  87  91  Resp: 17   20  Temp: 97.9 F (36.6 C)   98.5 F (36.9 C)  TempSrc: Oral   Oral  SpO2: 97%   94%  Weight:   58.2 kg   Height:        General appearance: Awake alert.  In no distress.  Distracted.  Does not answer any questions today. Resp: Clear to auscultation bilaterally.  Normal effort Cardio: S1-S2 is normal regular.  No S3-S4.  No rubs murmurs or bruit GI: Abdomen is soft.  Nontender nondistended.  Bowel sounds are present normal.  No masses organomegaly Extremities: No edema.     Data Reviewed:   I have personally reviewed following labs and imaging studies   CBC: Recent Labs  Lab 03/30/21 1659  WBC 9.6  HGB 13.6  HCT 40.4  MCV 96.4  PLT [id number]    Basic Metabolic Panel: Recent Labs  Lab 03/31/21 1854 04/01/21 0447 04/01/21 1633 04/02/21 [id number]  04/03/21 0144 04/04/21 0546  04/05/21 0443  NA  --  139  --  137 136 137 136  K  --  3.7  --  4.0 4.2 4.6 4.6  CL  --  107  --  104 104 102 102  CO2  --  28  --  30 30 29 30   GLUCOSE  --  286*  --  184* 222* 193* 240*  BUN  --  14  --  13 16 16 19   CREATININE  --  0.85  --  0.68 0.70 0.72 0.69  CALCIUM  --  9.5  --  9.1 9.3 9.1 9.5  MG 2.0 1.9 1.9 1.9  --  2.0  --   PHOS 1.5* 1.9* 2.5  --   --   --   --     Liver Function Tests: Recent Labs  Lab 04/01/21 0447  AST 35  ALT 40  ALKPHOS 115  BILITOT 0.5  PROT 4.3*  ALBUMIN 2.0*    CBG: Recent Labs  Lab 04/05/21 0006 04/05/21 0322 04/05/21 0804  GLUCAP 204* 197* 32*    Microbiology Studies:   Recent Results (from the past 240 hour(s))  Culture, blood (Routine x 2)     Status: None   Collection Time: 04/20/2021  2:06 PM   Specimen: BLOOD  Result Value Ref Range Status   Specimen Description   Final    BLOOD LEFT ANTECUBITAL Performed at [facility], [place] [address] [place], [place] [address]    Special Requests   Final    BOTTLES DRAWN AEROBIC AND ANAEROBIC Blood Culture adequate volume   Culture   Final    NO GROWTH 5 DAYS Performed at [facility], [place] [address] [place], [place] [address]    Report Status 04/01/2021 FINAL  Final  Culture, blood (Routine x 2)     Status: None   Collection Time: 04/15/2021  2:12 PM   Specimen: Site Not Specified; Blood  Result Value Ref Range Status   Specimen Description   Final    SITE NOT SPECIFIED Performed at [place] [address], [place], [place] [address]    Special Requests   Final    BOTTLES DRAWN AEROBIC AND ANAEROBIC Blood Culture adequate volume   Culture   Final    NO GROWTH 5 DAYS Performed at [facility], [place] [address], [place], [place] [address]    Report Status 04/01/2021 FINAL  Final  Urine culture     Status: None   Collection Time: 04/07/2021  2:14 PM   Specimen: Urine, Random  Result Value Ref Range Status   Specimen  Description   Final    URINE, RANDOM Performed at [place] [address] [place], [place] [address]    Special Requests   Final    NONE Performed at [facility], [place] [address] [place], [place] [address]    Culture   Final    NO GROWTH Performed at [facility], [place] [address], [place], [place] [address]    Report Status 03/28/2021 FINAL  Final  Resp Panel by RT-PCR (Flu A&B, Covid) Nasopharyngeal Swab     Status: None   Collection Time: 03/26/2021  3:15 PM   Specimen: Nasopharyngeal Swab; Nasopharyngeal(NP) swabs in vial transport medium  Result Value Ref Range Status   SARS Coronavirus 2 by RT PCR NEGATIVE NEGATIVE Final    Comment: (NOTE) SARS-CoV-2  target nucleic acids are NOT DETECTED.  The SARS-CoV-2 RNA is generally detectable in upper respiratory specimens during the acute phase of infection. The lowest concentration of SARS-CoV-2 viral copies this assay can detect is 138 copies/mL. A negative result does not preclude SARS-Cov-2 infection and should not be used as the sole basis for treatment or other patient management decisions. A negative result may occur with  improper specimen collection/handling, submission of specimen other than nasopharyngeal swab, presence of viral mutation(s) within the areas targeted by this assay, and inadequate number of viral copies(<138 copies/mL). A negative result must be combined with clinical observations, patient history, and epidemiological information. The expected result is Negative.  Fact Sheet for Patients:  EntrepreneurPulse.com.au  Fact Sheet for Healthcare Providers:  IncredibleEmployment.be  This test is no t yet approved or cleared by the [place] FDA and  has been authorized for detection and/or diagnosis of SARS-CoV-2 by FDA under an Emergency Use Authorization (EUA). This EUA will remain  in effect (meaning this test can be  used) for the duration of the COVID-19 declaration under Section 564(b)(1) of the Act, 21 U.S.C.section 360bbb-3(b)(1), unless the authorization is terminated  or revoked sooner.       Influenza A by PCR NEGATIVE NEGATIVE Final   Influenza B by PCR NEGATIVE NEGATIVE Final    Comment: (NOTE) The Xpert Xpress SARS-CoV-2/FLU/RSV plus assay is intended as an aid in the diagnosis of influenza from Nasopharyngeal swab specimens and should not be used as a sole basis for treatment. Nasal washings and aspirates are unacceptable for Xpert Xpress SARS-CoV-2/FLU/RSV testing.  Fact Sheet for Patients: EntrepreneurPulse.com.au  Fact Sheet for Healthcare Providers: IncredibleEmployment.be  This test is not yet approved or cleared by the [place] FDA and has been authorized for detection and/or diagnosis of SARS-CoV-2 by FDA under an Emergency Use Authorization (EUA). This EUA will remain in effect (meaning this test can be used) for the duration of the COVID-19 declaration under Section 564(b)(1) of the Act, 21 U.S.C. section 360bbb-3(b)(1), unless the authorization is terminated or revoked.  Performed at [facility], [place] [address], [place], [place] [address]   CSF culture w Stat Gram Stain     Status: None   Collection Time: 03/30/21  1:48 PM   Specimen: CSF; Cerebrospinal Fluid  Result Value Ref Range Status   Specimen Description CSF  Final   Special Requests NONE  Final   Gram Stain NO WBC SEEN NO ORGANISMS SEEN CYTOSPIN SMEAR   Final   Culture   Final    NO GROWTH 3 DAYS Performed at [facility], [place] [address], [place], [place] [address]    Report Status 04/02/2021 FINAL  Final     Radiology Studies:  No results found.   Scheduled Meds:   . amLODipine  5 mg Per Tube Daily  . Chlorhexidine Gluconate Cloth  6 each Topical Daily  . cyanocobalamin  1,000 mcg Intramuscular Q30 days  . feeding supplement  (PROSource TF)  45 mL Per Tube Daily  . folic acid  1 mg Intravenous Daily  . insulin aspart  0-6 Units Subcutaneous Q4H  . insulin glargine  14 Units Subcutaneous Daily  . thiamine injection  100 mg Intravenous Daily    Continuous Infusions:   . feeding supplement (OSMOLITE 1.5 CAL) 50 mL/hr at 04/04/21 1901     LOS: 9 days     Bonnielee Haff, [facility]    To contact the attending provider between 7A-7P  or the covering provider during after hours 7P-7A, please log into the web site [email or web address] and access using universal [place] password for that web site. If you do not have the password, please call the hospital operator.  04/05/2021, 9:13 AM

## 2021-04-06 ENCOUNTER — Inpatient Hospital Stay (HOSPITAL_COMMUNITY): Payer: Medicare PPO

## 2021-04-06 DIAGNOSIS — I10 Essential (primary) hypertension: Secondary | ICD-10-CM | POA: Diagnosis not present

## 2021-04-06 DIAGNOSIS — R627 Adult failure to thrive: Secondary | ICD-10-CM | POA: Diagnosis not present

## 2021-04-06 DIAGNOSIS — Z515 Encounter for palliative care: Secondary | ICD-10-CM | POA: Diagnosis not present

## 2021-04-06 DIAGNOSIS — Z7189 Other specified counseling: Secondary | ICD-10-CM | POA: Diagnosis not present

## 2021-04-06 DIAGNOSIS — G910 Communicating hydrocephalus: Secondary | ICD-10-CM | POA: Diagnosis not present

## 2021-04-06 DIAGNOSIS — E119 Type 2 diabetes mellitus without complications: Secondary | ICD-10-CM | POA: Diagnosis not present

## 2021-04-06 LAB — CBC
HCT: 43.2 % (ref 39.0–52.0)
Hemoglobin: 14.5 g/dL (ref 13.0–17.0)
MCH: 32.5 pg (ref 26.0–34.0)
MCHC: 33.6 g/dL (ref 30.0–36.0)
MCV: 96.9 fL (ref 80.0–100.0)
Platelets: 271 10*3/uL (ref 150–400)
RBC: 4.46 MIL/uL (ref 4.22–5.81)
RDW: 14.2 % (ref 11.5–15.5)
WBC: 19.1 10*3/uL — ABNORMAL HIGH (ref 4.0–10.5)
nRBC: 0 % (ref 0.0–0.2)

## 2021-04-06 LAB — BASIC METABOLIC PANEL
Anion gap: 4 — ABNORMAL LOW (ref 5–15)
BUN: 28 mg/dL — ABNORMAL HIGH (ref 8–23)
CO2: 30 mmol/L (ref 22–32)
Calcium: 10 mg/dL (ref 8.9–10.3)
Chloride: 102 mmol/L (ref 98–111)
Creatinine, Ser: 0.91 mg/dL (ref 0.61–1.24)
GFR, Estimated: >60 mL/min (ref >60)
Glucose, Bld: 298 mg/dL — ABNORMAL HIGH (ref 70–99)
Potassium: 5.2 mmol/L — ABNORMAL HIGH (ref 3.5–5.1)
Sodium: 136 mmol/L (ref 135–145)

## 2021-04-06 LAB — GLUCOSE, CAPILLARY
Glucose-Capillary: 226 mg/dL — ABNORMAL HIGH (ref 70–99)
Glucose-Capillary: 235 mg/dL — ABNORMAL HIGH (ref 70–99)
Glucose-Capillary: 252 mg/dL — ABNORMAL HIGH (ref 70–99)
Glucose-Capillary: 255 mg/dL — ABNORMAL HIGH (ref 70–99)
Glucose-Capillary: 298 mg/dL — ABNORMAL HIGH (ref 70–99)
Glucose-Capillary: 303 mg/dL — ABNORMAL HIGH (ref 70–99)
Glucose-Capillary: 342 mg/dL — ABNORMAL HIGH (ref 70–99)

## 2021-04-06 LAB — POTASSIUM: Potassium: 5.4 mmol/L — ABNORMAL HIGH (ref 3.5–5.1)

## 2021-04-06 LAB — MAGNESIUM: Magnesium: 2.3 mg/dL (ref 1.7–2.4)

## 2021-04-06 MED ORDER — SODIUM ZIRCONIUM CYCLOSILICATE 10 G PO PACK
10.0000 g | PACK | Freq: Three times a day (TID) | ORAL | Status: AC
  Administered 2021-04-06 (×2): 10 g
  Filled 2021-04-06 (×2): qty 1

## 2021-04-06 NOTE — Progress Notes (Signed)
Palliative:  HPI: 87 y.o.malewith past medical history of hydrocephalus s/p VP shunt, hypertension, diabetes, GERD, vocal cord paralysis, hypercalcemia, BPH, recent stroke, dementia (per family report)admitted on 5/6/2022with altered mental status.Multiple health complications and infections over the 6 weeks prior to admission with progressing physical decline, weakness, and poor intake. Ongoing encephalopathy and not alert enough for po intake. Palliative care requested to assist with goals of care in setting of poor progress and poor prognosis.  I met today at Robert Gross's bedside with wife and daughter, Robert Gross. We discussed Robert Gross's condition as he seems worse today and less responsive. Robert Gross shares that she is sad to see him so unresponsive to them today. We discussed again expectations and family is struggling with the thought of him not eating/drinking and essentially starvation. They also struggle with the fact that there is not one clear cause of his decline. We discussed that I worry that this is a result of all his health issues over the months and years and although he has historically bounced back well (another reason family is struggling with this decline). I worry that Robert Gross's body is failing and he is at end of life and I shared this with family. We discussed that people in this stage of illness do not feel hungry or thirsty. We further discussed care in hospice and what this would look like. I explained that he could absolutely have comfort feeding if awake enough even at hospice. We will continue our plan to meet again tomorrow 1000 am.   All questions/concerns addressed. Emotional support provided.   Exam: Opens eyes but only for a second. Does not follow commands and is not verbal. Breathing appears slightly labored but no distress. Abd soft.   Plan: - Follow up goals of care meeting 04/08/21 8757 am.   40 min  Vinie Sill, NP Palliative Medicine Team Pager  [phone] (Please see [email or web address] for schedule) Team Phone [phone]    Greater than 50%  of this time was spent counseling and coordinating care related to the above assessment and plan

## 2021-04-06 NOTE — Progress Notes (Signed)
Inpatient Diabetes Program Recommendations  AACE/ADA: New Consensus Statement on Inpatient Glycemic Control (2015)  Target Ranges:  Prepandial:   less than 140 mg/dL      Peak postprandial:   less than 180 mg/dL (1-2 hours)      Critically ill patients:  140 - 180 mg/dL   Results for ZAKI, GERTSCH (MRN [id number]) as of 04/06/2021 11:02  Ref. Range 04/05/2021 23:27 04/06/2021 00:03 04/06/2021 03:11 04/06/2021 07:17  Glucose-Capillary Latest Ref Range: 70 - 99 mg/dL 288 (H)    303 (H)  3 units NOVOLOG  252 (H)  3 units NOVOLOG 342 (H)  4 units NOVOLOG  14 units LANTUS     Home DM Meds: Metformin 1000 mg AM/ 500 mg PM   Current Orders: Lantus 14 units Daily      Novolog 0-6 units Q4 hours    MD- Note patient getting Osmolite tube feeds 50cc/hr  CBGs >250 since 12am today  Please consider adding Novolog Tube Feed Coverage:  Novolog 4 units Q4 hours   HOLD if tube feeds HELD for any reason    --Will follow patient during hospitalization--  Wyn Quaker RN, MSN, CDE Diabetes Coordinator Inpatient Glycemic Control Team Team Pager: [phone] (8a-5p)

## 2021-04-06 NOTE — Progress Notes (Signed)
Physical Therapy Treatment Patient Details Name: Robert Gross MRN: [id number] DOB: [birth year] Today's Date: 04/06/2021    History of Present Illness 87 yo male presents to [facility] on 5/6 with AMS x1 week (on antibiotics for UTI at home), recent ear infection with antibiotics. CT scans demonstrate evidence for left basal ganglia subacute hypodensity consistent with infarct, CSF results negative for meningitis, LP on 5/9. Additional workup for AME, UTI, elevated CSF pressure. 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    PT Comments    Today's skilled session focused on LE strengthening/ROM with pt minimally assisting at times, mostly passive movements. Spouse at bedside and appreciative of PT working to strengthen LE's. Pt non responsive to PTA and spouse, only moaning/grimacing with cervical passive rotation. RN reports pt medicated. Acute PT to continue during pt's hospital stay.    Follow Up Recommendations  SNF     Equipment Recommendations  None recommended by PT    Precautions / Restrictions Precautions Precautions: Fall;Other (comment) Precaution Comments: cortrak, incontinence Restrictions Weight Bearing Restrictions: No    Mobility    Modified Rankin (Stroke Patients Only) Modified Rankin (Stroke Patients Only) Pre-Morbid Rankin Score: Moderate disability Modified Rankin: Severe disability     Cognition Arousal/Alertness: Lethargic;Awake/alert (pt opens eyes, however does not track or respond other than moaning wtih cervical rotation) Behavior During Therapy: Flat affect Overall Cognitive Status: Impaired/Different from baseline Area of Impairment: Orientation;Attention;Memory;Following commands;Safety/judgement;Awareness;Problem solving                 Orientation Level: Disoriented  to;Place;Situation;Time Current Attention Level: Focused Memory: Decreased short-term memory Following Commands:  (did not follow any commands this session, however did assist with ex's at times randomly with LE ex's.) Safety/Judgement: Decreased awareness of safety;Decreased awareness of deficits Awareness: Intellectual Problem Solving: Slow processing;Requires verbal cues;Decreased initiation;Difficulty sequencing;Requires tactile cues General Comments: pt non verbal this session, did not engage with PTA or spouse during session.      Exercises General Exercises - Lower Extremity Ankle Circles/Pumps: AAROM;PROM;Both;10 reps;Supine Quad Sets: PROM;Both;10 reps;Supine Heel Slides: AAROM;PROM;Both;10 reps;Supine Hip ABduction/ADduction: AAROM;PROM;Strengthening;Both;10 reps;Supine Straight Leg Raises: AAROM;PROM;Both;10 reps;Supine Other Exercises Other Exercises: pt noted to have significant cervial rotation toward left. worked on passive cervical rotation toward right. Able to almost achieve neutral, at this time pt guards and moans so did not push beyond pain point. Performed for 5 reps.     Pertinent Vitals/Pain Pain Assessment: Faces Faces Pain Scale: Hurts a little bit Pain Location: with cervical rotation toward right side Pain Descriptors / Indicators: Grimacing;Guarding;Moaning Pain Intervention(s): Limited activity within patient's tolerance;Monitored during session;Premedicated before session;Repositioned     PT Goals (current goals can now be found in the care plan section) Acute Rehab PT Goals Patient Stated Goal: family would like to see pt able to walk again PT Goal Formulation: With family Time For Goal Achievement: 04/15/21 Potential to Achieve Goals: Fair Progress towards PT goals: Not progressing toward goals - comment (pt more lethargic/non paticipatory this session)    Frequency    Min 2X/week      PT Plan Current plan remains appropriate;Other  (comment) (pallitive now involved, may need to be updated)    AM-PAC PT "6 Clicks" Mobility   Outcome Measure    Help needed moving from lying on your back to sitting on the side of a flat bed without using bedrails?: Total Help needed moving to and  from a bed to a chair (including a wheelchair)?: Total Help needed standing up from a chair using your arms (e.g., wheelchair or bedside chair)?: Total Help needed to walk in hospital room?: Total Help needed climbing 3-5 steps with a railing? : Total 6 Click Score: 5    End of Session   Activity Tolerance: Patient limited by fatigue;Patient limited by lethargy Patient left: in bed;with call bell/phone within reach;with bed alarm set;with family/visitor present Nurse Communication: Mobility status PT Visit Diagnosis: Other abnormalities of gait and mobility (R26.89);Difficulty in walking, not elsewhere classified (R26.2)     Time: 5784-6962 PT Time Calculation (min) (ACUTE ONLY): 13 min  Charges:  $Therapeutic Exercise: 8-22 mins                    Willow Ora, PTA, [facility] Acute Rehab Services [phone] 04/06/21, 12:32 PM   Willow Ora 04/06/2021, 12:32 PM

## 2021-04-06 NOTE — Procedures (Signed)
Cortrak  Person Inserting Tube:  Danella Philson E, RD Tube Type:  Cortrak - 43 inches Tube Location:  Right nare Initial Placement:  Stomach Secured by: Bridle Technique Used to Measure Tube Placement:  Documented cm marking at nare/ corner of mouth Cortrak Secured At:  70 cm    Cortrak Tube Team Note:  Consult received to place a Cortrak feeding tube.   No x-ray is required. RN may begin using tube.    If the tube becomes dislodged please keep the tube and contact the Cortrak team at [email or web address] ([email or web address]) for replacement.  If after hours and replacement cannot be delayed, place a NG tube and confirm placement with an abdominal x-ray.    Antero Derosia, MS, RD, LDN RD pager number and weekend/on-call pager number located in Amion.   

## 2021-04-06 NOTE — Progress Notes (Signed)
PROGRESS NOTE   Robert Gross  XAJ:[id number]    DOB: Dec 14, 1928    DOA: 04/15/2021  PCP: [name], MD    Chief Complaint  Patient presents with  . Altered Mental Status    Brief Narrative:  87 year old male with medical history significant for but not limited to communicating hydrocephalus s/p VP shunt, family reported dementia, hypertension, DM2, BPH, GERD, recent CVA, hyperlipidemia, presented with worsening confusion, weakness and poor oral intake.  He was reportedly doing well about 6 weeks ago, then sometime in March he was diagnosed with an ear infection and started on antibiotics and 2 weeks of prednisone.  During the steroid course, he had hypertension and hyperglycemia up to the 200s.  Once the prednisone was stopped, there was a sharp decline in his health and progressive weakness.  CT head for shunt follow-up 4/5 showed new subacute infarct.  Week prior to admission, he was also diagnosed with yeast UTI and started on fluconazole.  Seen by neurosurgery and VP shunt readjusted on 03/24/2021.  He progressively worsened, unable to get out of bed, difficulty eating.  Admitted initially for acute metabolic encephalopathy, hypernatremia, hypercalcemia, dehydration, suspected UTI.  He had complained of neck stiffness for 2 weeks at home, due to concern for CNS infection, empiric meningitis coverage was started and patient was transferred from [facility] to [facility] for further evaluation and management.  No acyclovir started due to low suspicion for herpes encephalitis.  Neurology and neurosurgery were consulted.  Patient underwent LP 5/9.  Ongoing AMS with unsafe oral intake.  Family opted for [facility] tube placement and tube feeds.   Assessment & Plan:   Acute metabolic encephalopathy Decline in mental status started approximately 6 weeks PTA with a more rapid decline over the past 2 weeks PTA when he became nonverbal and increasingly lethargic.  Over the past 6 weeks, he was treated  for an ear infection with cefdinir and a 12-day course of prednisone, UTI with Cipro and fluconazole and his VP shunt was readjusted by neurosurgery.   Patient underwent CT scan of the head which showed mildly dilated lateral ventricles.  No acute findings noted.  EEG did not show any epileptiform activity.   Vitamin B12 level was 356.  B1 level is 97.6 with a normal range being between 66-200. TSH 4.6.  Free T4 0.97.  Ammonia 28.  Urine culture without growth.  Blood cultures negative.  CSF work-up was unremarkable.  Antibiotics were discontinued. Patient remains on IV thiamine.  MRI brain was done which showed subacute to chronic infarct.  No acute findings noted.  Shunt setting was adjusted by neurosurgery on 5/11. Per neurology there is no further testing or interventions to be done at this time.  Neurology did discuss all the above with family members.   It remains unclear as to how long the patient may take to recover but patient has not shown much signs of recovery in the last several days. He remains at very high risk for aspiration so he remains on tube feedings.  Palliative care consulted.   Patient's mentation appears to be worse this morning compared to yesterday.  It could be because it is still early in the day and might be sleepy.  But it could also be indicative of his declining status.  Folic acid deficiency Folate level noted to be 4.0.  Started on supplementation.  Leukocytosis Noted to be afebrile.  Could be reactive.  It is likely that the patient is declining.  Could consider doing further work-up but will first discuss with palliative care.  Neck rigidity/possible torticollis Evaluation negative for meningitis.  Symptomatic treatment with heating pads, topical menthol and Tylenol.  Avoid opiates or sedatives.   History of communicating hydrocephalus s/p VP shunt Neurosurgery had been following.  As per neurosurgery, he developed bilateral subdural fluid collections  necessitating adjustment of his shunt valve pressure higher.  His subdural collections essentially resolved but he continues to have mild ventriculomegaly.   Shunt valve was adjusted again on 5/11 to maximize flow.  AKI, mild Resolved.   Dehydration with hypernatremia: Serum sodium 150 on admission, likely related to poor oral intake.  Resolved.  Hypercalcemia Noted to have serum calcium of 11.5.  Resolved.    Hypokalemia/hypomagnesemia/hypophosphatemia Continue to monitor electrolytes.  Potassium noted to be 5.2.  We will recheck today.  Dysphagia Unsafe oral intake due to AMS.  Speech therapy recommended NPO and alternate means of nutrition.  After discussions with family core track feeding tube was placed and the patient was started on tube feedings. Speech therapy has been following however due to his waxing and waning mental status they continue to recommend n.p.o. status.    Type II DM CBGs were noted to be elevated due to his tube feedings.  Started on Lantus.  Continue SSI.  HbA1c 9.3.  Lantus dose was adjusted.  Will not be too aggressive to avoid episodes of hypoglycemia.  Questionable CVA Per CT head 03/16/2021: Stable appearance of hypodensity seen in the left anterior basal ganglia consistent with subacute infarction.  MRI brain shows subacute to chronic infarct.  No acute findings noted.  Essential hypertension Lisinopril was placed on hold due to AKI.  Amlodipine was initiated.  Amlodipine dose was increased for elevated blood pressure.  Continue to monitor for now.     Thrombocytopenia: Unclear etiology. Resolved.  Adult failure to thrive Likely multifactorial.    Urinary retention Had indwelling Foley catheter which was removed.  Monitor for retention.  Goals of care Despite maximal treatment patient has not shown significant improvement.  Mentation has been waxing and waning.  It appears that there is no further plans for intervention by neurosurgery or  neurology.  Unable to take orally due to high risk of aspiration per speech therapy.  All of this was discussed with patient's daughter.  Palliative care was consulted. Palliative care continues to follow.  Patient's mentation seems to be worse this morning.  Patient appears to be actively declining.   DVT prophylaxis: Place and maintain sequential compression device Start: 03/29/21 0749     Code Status: DNR Family Communication: No family at bedside.   Disposition: SNF recommended by PT.  Status is: Inpatient  Remains inpatient appropriate because:Inpatient level of care appropriate due to severity of illness   Dispo: The patient is from: Home              Anticipated d/c is to: TBD              Patient currently is not medically stable to d/c.   Difficult to place patient No        Consultants:   Neurosurgery Neurology  Procedures:   LP by neurology 5/9.  Antimicrobials:    Anti-infectives (From admission, onward)   Start     Dose/Rate Route Frequency Ordered Stop   03/28/21 2200  cefTRIAXone (ROCEPHIN) 2 g in sodium chloride 0.9 % 100 mL IVPB  Status:  Discontinued        2  g 200 mL/hr over 30 Minutes Intravenous Every 12 hours 03/28/21 1244 03/31/21 0732   03/28/21 1800  vancomycin (VANCOREADY) IVPB 1000 mg/200 mL  Status:  Discontinued        1,000 mg 200 mL/hr over 60 Minutes Intravenous Every 24 hours 03/28/21 0006 03/28/21 0757   03/28/21 1600  vancomycin (VANCOREADY) IVPB 500 mg/100 mL  Status:  Discontinued        500 mg 100 mL/hr over 60 Minutes Intravenous Every 12 hours 03/28/21 1457 03/31/21 0732   03/28/21 1400  fluconazole (DIFLUCAN) IVPB 100 mg  Status:  Discontinued        100 mg 50 mL/hr over 60 Minutes Intravenous Every 24 hours 03/28/21 1239 03/30/21 1639   03/28/21 1330  vancomycin (VANCOREADY) IVPB 1000 mg/200 mL  Status:  Discontinued        1,000 mg 200 mL/hr over 60 Minutes Intravenous  Once 03/28/21 1244 03/28/21 1259   03/28/21 1300   ampicillin (OMNIPEN) 2 g in sodium chloride 0.9 % 100 mL IVPB  Status:  Discontinued        2 g 300 mL/hr over 20 Minutes Intravenous Every 6 hours 03/28/21 1244 03/31/21 0732   03/28/21 1000  fluconazole (DIFLUCAN) tablet 100 mg  Status:  Discontinued        100 mg Oral Daily 04/19/2021 2341 03/28/21 1239   03/28/21 0800  ceFEPIme (MAXIPIME) 2 g in sodium chloride 0.9 % 100 mL IVPB  Status:  Discontinued        2 g 200 mL/hr over 30 Minutes Intravenous Every 12 hours 03/28/21 0006 03/28/21 1244   03/28/21 0400  metroNIDAZOLE (FLAGYL) IVPB 500 mg  Status:  Discontinued        500 mg 100 mL/hr over 60 Minutes Intravenous Every 8 hours 03/22/2021 2346 03/28/21 0757   03/25/2021 1815  metroNIDAZOLE (FLAGYL) IVPB 500 mg        500 mg 100 mL/hr over 60 Minutes Intravenous  Once 04/12/2021 1805 04/12/2021 2049   04/21/2021 1815  ceFEPIme (MAXIPIME) 2 g in sodium chloride 0.9 % 100 mL IVPB        2 g 200 mL/hr over 30 Minutes Intravenous  Once 04/04/2021 1808 03/24/2021 2049   03/22/2021 1800  vancomycin (VANCOCIN) IVPB 1000 mg/200 mL premix        1,000 mg 200 mL/hr over 60 Minutes Intravenous  Once 04/12/2021 1752 04/21/2021 1951   03/26/2021 1515  cefTRIAXone (ROCEPHIN) 1 g in sodium chloride 0.9 % 100 mL IVPB  Status:  Discontinued        1 g 200 mL/hr over 30 Minutes Intravenous Every 24 hours 04/06/2021 1513 04/18/2021 1808        Subjective:  Patient very somnolent.  Not very easily arousable today.    Objective:   Vitals:   04/05/21 2328 04/06/21 0002 04/06/21 0310 04/06/21 0717  BP: (!) 141/67 (!) 142/68 (!) 144/65 (!) 143/67  Pulse: (!) 104 (!) 102 (!) 103 97  Resp:  18  18  Temp: 98.7 F (37.1 C) 98.2 F (36.8 C) (!) 97.3 F (36.3 C) 98 F (36.7 C)  TempSrc: Oral Oral Axillary Axillary  SpO2: 95% 93% 95% 97%  Weight:      Height:        General appearance: Somnolent.  Not very easily arousable. Resp: Coarse breath sounds.  Normal effort. Cardio: S1-S2 is normal regular.  No S3-S4.  No  rubs murmurs or bruit GI: Abdomen is soft.  Nontender nondistended.  Bowel sounds are present normal.  No masses organomegaly Extremities: No edema.  Does not follow commands today. Neurologic: Lethargic       Data Reviewed:   I have personally reviewed following labs and imaging studies   CBC: Recent Labs  Lab 03/30/21 1659 04/06/21 0451  WBC 9.6 19.1*  HGB 13.6 14.5  HCT 40.4 43.2  MCV 96.4 96.9  PLT 167 784    Basic Metabolic Panel: Recent Labs  Lab 03/31/21 1854 04/01/21 0447 04/01/21 1633 04/02/21 0833 04/03/21 0144 04/04/21 0546 04/05/21 0443 04/06/21 0451  NA  --  139  --  137   < > 137 136 136  K  --  3.7  --  4.0   < > 4.6 4.6 5.2*  CL  --  107  --  104   < > 102 102 102  CO2  --  28  --  30   < > 29 30 30   GLUCOSE  --  286*  --  184*   < > 193* 240* 298*  BUN  --  14  --  13   < > 16 19 28*  CREATININE  --  0.85  --  0.68   < > 0.72 0.69 0.91  CALCIUM  --  9.5  --  9.1   < > 9.1 9.5 10.0  MG 2.0 1.9 1.9 1.9  --  2.0  --  2.3  PHOS 1.5* 1.9* 2.5  --   --   --   --   --    < > = values in this interval not displayed.    Liver Function Tests: Recent Labs  Lab 04/01/21 0447  AST 35  ALT 40  ALKPHOS 115  BILITOT 0.5  PROT 4.3*  ALBUMIN 2.0*    CBG: Recent Labs  Lab 04/06/21 0003 04/06/21 0311 04/06/21 0717  GLUCAP 303* 252* 342*    Microbiology Studies:   Recent Results (from the past 240 hour(s))  Culture, blood (Routine x 2)     Status: None   Collection Time: 04/19/2021  2:06 PM   Specimen: BLOOD  Result Value Ref Range Status   Specimen Description   Final    BLOOD LEFT ANTECUBITAL Performed at [facility], [place] [address], [place], [place] [address]    Special Requests   Final    BOTTLES DRAWN AEROBIC AND ANAEROBIC Blood Culture adequate volume   Culture   Final    NO GROWTH 5 DAYS Performed at [facility], [place] [address] [place], [place] [address]    Report Status 04/01/2021 FINAL  Final   Culture, blood (Routine x 2)     Status: None   Collection Time: 03/31/2021  2:12 PM   Specimen: Site Not Specified; Blood  Result Value Ref Range Status   Specimen Description   Final    SITE NOT SPECIFIED Performed at [place] [address] [place], [place] [address]    Special Requests   Final    BOTTLES DRAWN AEROBIC AND ANAEROBIC Blood Culture adequate volume   Culture   Final    NO GROWTH 5 DAYS Performed at [facility], [place] [address] [place],  [address]    Report Status 04/01/2021 FINAL  Final  Urine culture     Status: None   Collection Time: 03/31/2021  2:14 PM   Specimen: Urine, Random  Result Value Ref Range Status   Specimen Description  Final    URINE, RANDOM Performed at [facility], [place] [address], [place], [place] [address]    Special Requests   Final    NONE Performed at [facility], [place] [address], [place], [place] [address]    Culture   Final    NO GROWTH Performed at [facility], [place] [address] [place], [place] [address]    Report Status 03/28/2021 FINAL  Final  Resp Panel by RT-PCR (Flu A&B, Covid) Nasopharyngeal Swab     Status: None   Collection Time: 04/21/2021  3:15 PM   Specimen: Nasopharyngeal Swab; Nasopharyngeal(NP) swabs in vial transport medium  Result Value Ref Range Status   SARS Coronavirus 2 by RT PCR NEGATIVE NEGATIVE Final    Comment: (NOTE) SARS-CoV-2 target nucleic acids are NOT DETECTED.  The SARS-CoV-2 RNA is generally detectable in upper respiratory specimens during the acute phase of infection. The lowest concentration of SARS-CoV-2 viral copies this assay can detect is 138 copies/mL. A negative result does not preclude SARS-Cov-2 infection and should not be used as the sole basis for treatment or other patient management decisions. A negative result may occur with  improper specimen collection/handling, submission of specimen other than  nasopharyngeal swab, presence of viral mutation(s) within the areas targeted by this assay, and inadequate number of viral copies(<138 copies/mL). A negative result must be combined with clinical observations, patient history, and epidemiological information. The expected result is Negative.  Fact Sheet for Patients:  EntrepreneurPulse.com.au  Fact Sheet for Healthcare Providers:  IncredibleEmployment.be  This test is no t yet approved or cleared by the [place] FDA and  has been authorized for detection and/or diagnosis of SARS-CoV-2 by FDA under an Emergency Use Authorization (EUA). This EUA will remain  in effect (meaning this test can be used) for the duration of the COVID-19 declaration under Section 564(b)(1) of the Act, 21 U.S.C.section 360bbb-3(b)(1), unless the authorization is terminated  or revoked sooner.       Influenza A by PCR NEGATIVE NEGATIVE Final   Influenza B by PCR NEGATIVE NEGATIVE Final    Comment: (NOTE) The Xpert Xpress SARS-CoV-2/FLU/RSV plus assay is intended as an aid in the diagnosis of influenza from Nasopharyngeal swab specimens and should not be used as a sole basis for treatment. Nasal washings and aspirates are unacceptable for Xpert Xpress SARS-CoV-2/FLU/RSV testing.  Fact Sheet for Patients: EntrepreneurPulse.com.au  Fact Sheet for Healthcare Providers: IncredibleEmployment.be  This test is not yet approved or cleared by the [place] FDA and has been authorized for detection and/or diagnosis of SARS-CoV-2 by FDA under an Emergency Use Authorization (EUA). This EUA will remain in effect (meaning this test can be used) for the duration of the COVID-19 declaration under Section 564(b)(1) of the Act, 21 U.S.C. section 360bbb-3(b)(1), unless the authorization is terminated or revoked.  Performed at [facility], [place] [address], [place], [place] [address]   CSF culture w Stat Gram Stain     Status: None   Collection Time: 03/30/21  1:48 PM   Specimen: CSF; Cerebrospinal Fluid  Result Value Ref Range Status   Specimen Description CSF  Final   Special Requests NONE  Final   Gram Stain NO WBC SEEN NO ORGANISMS SEEN CYTOSPIN SMEAR   Final   Culture   Final    NO GROWTH 3 DAYS Performed at [facility], [place] [address] [place],  [address]    Report Status 04/02/2021 FINAL  Final     Radiology Studies:  No results found.   Scheduled Meds:   . amLODipine  10 mg Per Tube Daily  . Chlorhexidine Gluconate Cloth  6 each Topical Daily  . cyanocobalamin  1,000 mcg Intramuscular Q30 days  . feeding supplement (PROSource TF)  45 mL Per Tube Daily  . folic acid  1 mg Intravenous Daily  . insulin aspart  0-6 Units Subcutaneous Q4H  . insulin glargine  14 Units Subcutaneous Daily  . thiamine injection  100 mg Intravenous Daily    Continuous Infusions:   . feeding supplement (OSMOLITE 1.5 CAL) 50 mL/hr at 04/04/21 1901     LOS: 10 days     Bonnielee Haff, [facility]    To contact the attending provider between 7A-7P or the covering provider during after hours 7P-7A, please log into the web site [email or web address] and access using universal [place] password for that web site. If you do not have the password, please call the hospital operator.  04/06/2021, 8:49 AM

## 2021-04-07 DIAGNOSIS — G910 Communicating hydrocephalus: Secondary | ICD-10-CM | POA: Diagnosis not present

## 2021-04-07 DIAGNOSIS — I10 Essential (primary) hypertension: Secondary | ICD-10-CM | POA: Diagnosis not present

## 2021-04-07 DIAGNOSIS — Z7189 Other specified counseling: Secondary | ICD-10-CM | POA: Diagnosis not present

## 2021-04-07 DIAGNOSIS — E119 Type 2 diabetes mellitus without complications: Secondary | ICD-10-CM | POA: Diagnosis not present

## 2021-04-07 DIAGNOSIS — Z515 Encounter for palliative care: Secondary | ICD-10-CM | POA: Diagnosis not present

## 2021-04-07 LAB — GLUCOSE, CAPILLARY
Glucose-Capillary: 245 mg/dL — ABNORMAL HIGH (ref 70–99)
Glucose-Capillary: 244 mg/dL — ABNORMAL HIGH (ref 70–99)

## 2021-04-07 LAB — BASIC METABOLIC PANEL
Anion gap: 6 (ref 5–15)
BUN: 42 mg/dL — ABNORMAL HIGH (ref 8–23)
CO2: 30 mmol/L (ref 22–32)
Calcium: 10.4 mg/dL — ABNORMAL HIGH (ref 8.9–10.3)
Chloride: 102 mmol/L (ref 98–111)
Creatinine, Ser: 1.01 mg/dL (ref 0.61–1.24)
GFR, Estimated: >60 mL/min (ref >60)
Glucose, Bld: 258 mg/dL — ABNORMAL HIGH (ref 70–99)
Potassium: 4.6 mmol/L (ref 3.5–5.1)
Sodium: 138 mmol/L (ref 135–145)

## 2021-04-07 MED ORDER — POLYVINYL ALCOHOL 1.4 % OP SOLN
1.0000 [drp] | Freq: Four times a day (QID) | OPHTHALMIC | Status: DC | PRN
  Filled 2021-04-07: qty 15

## 2021-04-07 MED ORDER — LORAZEPAM 2 MG/ML IJ SOLN
0.5000 mg | INTRAMUSCULAR | Status: DC | PRN
  Administered 2021-04-11: 0.5 mg via INTRAVENOUS
  Filled 2021-04-07: qty 1

## 2021-04-07 MED ORDER — GLYCOPYRROLATE 0.2 MG/ML IJ SOLN
0.4000 mg | INTRAMUSCULAR | Status: DC
  Administered 2021-04-07 – 2021-04-13 (×31): 0.4 mg via INTRAVENOUS
  Filled 2021-04-07 (×35): qty 2

## 2021-04-07 MED ORDER — MORPHINE SULFATE (PF) 2 MG/ML IV SOLN
2.0000 mg | INTRAVENOUS | Status: DC | PRN
Start: 2021-04-07 — End: 2021-04-13
  Administered 2021-04-07 – 2021-04-10 (×5): 2 mg via INTRAVENOUS
  Filled 2021-04-07 (×5): qty 1

## 2021-04-07 MED ORDER — HALOPERIDOL LACTATE 5 MG/ML IJ SOLN: 2.0000 mg | Freq: Four times a day (QID) | INTRAMUSCULAR | Status: DC | PRN

## 2021-04-07 MED ORDER — BIOTENE DRY MOUTH MT LIQD: 15.0000 mL | OROMUCOSAL | Status: DC | PRN

## 2021-04-07 NOTE — Progress Notes (Signed)
OT Cancellation Note  Patient Details Name: LINKOLN ALKIRE MRN: [id number] DOB: February 26, 1929   Cancelled Treatment:    Reason Eval/Treat Not Completed: Other (comment)- acknowledged discontinued OT order.  If further needs arise please re-consult.    Jolaine Artist, OT Acute Rehabilitation Services Pager [phone] Office [phone]   Delight Stare 04/07/2021, 10:45 AM

## 2021-04-07 NOTE — Progress Notes (Signed)
Nutrition Brief Note  Chart reviewed. Pt now transitioning to comfort care.  No further nutrition interventions planned at this time.  Please re-consult as needed.   Azariah [place]ura, MS, RD, LDN RD pager number and weekend/on-call pager number located in Amion.    

## 2021-04-07 NOTE — Progress Notes (Signed)
Pt has been made comfort care only

## 2021-04-07 NOTE — Progress Notes (Addendum)
PROGRESS NOTE   Robert Gross  DTO:[id number]    DOB: August 19, 1929    DOA: 04/06/2021  PCP: [name], MD    Chief Complaint  Patient presents with  . Altered Mental Status    Brief Narrative:  87 year old male with medical history significant for but not limited to communicating hydrocephalus s/p VP shunt, family reported dementia, hypertension, DM2, BPH, GERD, recent CVA, hyperlipidemia, presented with worsening confusion, weakness and poor oral intake.  He was reportedly doing well about 6 weeks ago, then sometime in March he was diagnosed with an ear infection and started on antibiotics and 2 weeks of prednisone.  During the steroid course, he had hypertension and hyperglycemia up to the 200s.  Once the prednisone was stopped, there was a sharp decline in his health and progressive weakness.  CT head for shunt follow-up 4/5 showed new subacute infarct.  Week prior to admission, he was also diagnosed with yeast UTI and started on fluconazole.  Seen by neurosurgery and VP shunt readjusted on 03/24/2021.  He progressively worsened, unable to get out of bed, difficulty eating.  Admitted initially for acute metabolic encephalopathy, hypernatremia, hypercalcemia, dehydration, suspected UTI.  He had complained of neck stiffness for 2 weeks at home, due to concern for CNS infection, empiric meningitis coverage was started and patient was transferred from [facility] to [facility] for further evaluation and management.  No acyclovir started due to low suspicion for herpes encephalitis.  Neurology and neurosurgery were consulted.  Patient underwent LP 5/9.  Ongoing AMS with unsafe oral intake.  Family opted for [facility] tube placement and tube feeds.   Assessment & Plan:   Acute metabolic encephalopathy Decline in mental status started approximately 6 weeks PTA with a more rapid decline over the past 2 weeks PTA when he became nonverbal and increasingly lethargic.  Over the past 6 weeks, he was treated  for an ear infection with cefdinir and a 12-day course of prednisone, UTI with Cipro and fluconazole and his VP shunt was readjusted by neurosurgery.   Patient underwent CT scan of the head which showed mildly dilated lateral ventricles.  No acute findings noted.  EEG did not show any epileptiform activity.   Vitamin B12 level was 356.  B1 level is 97.6 with a normal range being between 66-200. TSH 4.6.  Free T4 0.97.  Ammonia 28.  Urine culture without growth.  Blood cultures negative.  CSF work-up was unremarkable.  Antibiotics were discontinued. Patient remains on IV thiamine.   MRI brain was done which showed subacute to chronic infarct.  No acute findings noted.  Shunt setting was adjusted by neurosurgery on 5/11. Per neurology there is no further testing or interventions to be done at this time.  Neurology did discuss all the above with family members.   Initially it was felt that patient may take a long time to recover but patient has been getting worse over the last several days.  His mentation is worse over the last 48 hours.  He pulled out his core track feeding tube yesterday which had to be replaced.  He has been agitated and had to be restrained.  This morning noted to be hypoxic.  Patient appears to be actively declining.  Would be appropriate to transition to comfort care.  Palliative Care to meet with family this morning.  Folic acid deficiency Folate level noted to be 4.0.  Started on supplementation.  Leukocytosis Noted to be afebrile.  Could be reactive.  It is  likely that the patient is declining.  Chest x-ray done yesterday did not show any overt pneumonia.  Patient has been afebrile.  Neck rigidity/possible torticollis Evaluation negative for meningitis.  Symptomatic treatment with heating pads, topical menthol and Tylenol.  Avoid opiates or sedatives.   History of communicating hydrocephalus s/p VP shunt Neurosurgery had been following.  As per neurosurgery, he developed  bilateral subdural fluid collections necessitating adjustment of his shunt valve pressure higher.  His subdural collections essentially resolved but he continues to have mild ventriculomegaly.   Shunt valve was adjusted again on 5/11 to maximize flow.  AKI, mild Resolved.   Dehydration with hypernatremia: Serum sodium 150 on admission, likely related to poor oral intake.  Resolved.  Hypercalcemia Noted to have serum calcium of 11.5.  Resolved.    Hypokalemia/hypomagnesemia/hypophosphatemia Continue to monitor electrolytes.  Potassium noted to be 5.2.  We will recheck today.  Dysphagia Unsafe oral intake due to AMS.  Speech therapy recommended NPO and alternate means of nutrition.  After discussions with family core track feeding tube was placed and the patient was started on tube feedings. Primarily due to his altered mentation and n.p.o. status is recommended.  Tube feedings were continued.  He pulled out his feeding tube yesterday and had to be replaced.  Type II DM CBGs were noted to be elevated due to his tube feedings.  Started on Lantus.  Continue SSI.  HbA1c 9.3.  Lantus dose was adjusted.  Will not be too aggressive to avoid episodes of hypoglycemia. Effort from increasing the dose of Lantus today.  But we will see how the discussion goes between family and palliative care.  Questionable CVA Per CT head 03/16/2021: Stable appearance of hypodensity seen in the left anterior basal ganglia consistent with subacute infarction.  MRI brain shows subacute to chronic infarct.  No acute findings noted.  Essential hypertension Lisinopril was placed on hold due to AKI.  Amlodipine was initiated.  Amlodipine dose was increased for elevated blood pressure.  Continue to monitor for now.     Thrombocytopenia: Unclear etiology. Resolved.  Adult failure to thrive Likely multifactorial.    Urinary retention Had indwelling Foley catheter which was removed.  Monitor for retention.  Goals of  care Despite maximal treatment patient has not shown significant improvement.  Initially mentation was waxing and waning.  No further evaluation recommended by neurology or neurosurgery.  Patient was not able to safely swallow.  His mentation has become worse in the last 48 hours.  He is more hypoxic.  Palliative care to meet with family this morning.  Patient is appropriate for transition to comfort care.     DVT prophylaxis: Place and maintain sequential compression device Start: 03/29/21 0749     Code Status: DNR Family Communication: No family at bedside.   Disposition: SNF recommended by PT.  Status is: Inpatient  Remains inpatient appropriate because:Inpatient level of care appropriate due to severity of illness   Dispo: The patient is from: Home              Anticipated d/c is to: TBD              Patient currently is not medically stable to d/c.   Difficult to place patient No        Consultants:   Neurosurgery Neurology  Procedures:   LP by neurology 5/9.  Antimicrobials:    Anti-infectives (From admission, onward)   Start     Dose/Rate Route Frequency Ordered Stop  03/28/21 2200  cefTRIAXone (ROCEPHIN) 2 g in sodium chloride 0.9 % 100 mL IVPB  Status:  Discontinued        2 g 200 mL/hr over 30 Minutes Intravenous Every 12 hours 03/28/21 1244 03/31/21 0732   03/28/21 1800  vancomycin (VANCOREADY) IVPB 1000 mg/200 mL  Status:  Discontinued        1,000 mg 200 mL/hr over 60 Minutes Intravenous Every 24 hours 03/28/21 0006 03/28/21 0757   03/28/21 1600  vancomycin (VANCOREADY) IVPB 500 mg/100 mL  Status:  Discontinued        500 mg 100 mL/hr over 60 Minutes Intravenous Every 12 hours 03/28/21 1457 03/31/21 0732   03/28/21 1400  fluconazole (DIFLUCAN) IVPB 100 mg  Status:  Discontinued        100 mg 50 mL/hr over 60 Minutes Intravenous Every 24 hours 03/28/21 1239 03/30/21 1639   03/28/21 1330  vancomycin (VANCOREADY) IVPB 1000 mg/200 mL  Status:  Discontinued         1,000 mg 200 mL/hr over 60 Minutes Intravenous  Once 03/28/21 1244 03/28/21 1259   03/28/21 1300  ampicillin (OMNIPEN) 2 g in sodium chloride 0.9 % 100 mL IVPB  Status:  Discontinued        2 g 300 mL/hr over 20 Minutes Intravenous Every 6 hours 03/28/21 1244 03/31/21 0732   03/28/21 1000  fluconazole (DIFLUCAN) tablet 100 mg  Status:  Discontinued        100 mg Oral Daily 03/28/2021 2341 03/28/21 1239   03/28/21 0800  ceFEPIme (MAXIPIME) 2 g in sodium chloride 0.9 % 100 mL IVPB  Status:  Discontinued        2 g 200 mL/hr over 30 Minutes Intravenous Every 12 hours 03/28/21 0006 03/28/21 1244   03/28/21 0400  metroNIDAZOLE (FLAGYL) IVPB 500 mg  Status:  Discontinued        500 mg 100 mL/hr over 60 Minutes Intravenous Every 8 hours 04/04/2021 2346 03/28/21 0757   03/30/2021 1815  metroNIDAZOLE (FLAGYL) IVPB 500 mg        500 mg 100 mL/hr over 60 Minutes Intravenous  Once 04/08/2021 1805 04/12/2021 2049   04/04/2021 1815  ceFEPIme (MAXIPIME) 2 g in sodium chloride 0.9 % 100 mL IVPB        2 g 200 mL/hr over 30 Minutes Intravenous  Once 03/26/2021 1808 04/12/2021 2049   04/11/2021 1800  vancomycin (VANCOCIN) IVPB 1000 mg/200 mL premix        1,000 mg 200 mL/hr over 60 Minutes Intravenous  Once 03/25/2021 1752 03/30/2021 1951   04/07/2021 1515  cefTRIAXone (ROCEPHIN) 1 g in sodium chloride 0.9 % 100 mL IVPB  Status:  Discontinued        1 g 200 mL/hr over 30 Minutes Intravenous Every 24 hours 03/31/2021 1513 03/26/2021 1808        Subjective:  Patient is somnolent, mildly agitated.  In no distress.    Objective:   Vitals:   04/07/21 0613 04/07/21 0806 04/07/21 0830 04/07/21 0835  BP:  (!) 159/83    Pulse:  (!) 110    Resp:  19    Temp:  99.1 F (37.3 C)    TempSrc:  Axillary    SpO2:  (!) 88% 90% 91%  Weight: 56.6 kg     Height:        General appearance: Eyes open but does not respond.  Somewhat agitated. Resp: Coarse breath sounds bilaterally Cardio: S1-S2 is normal regular.  No  S3-S4.  No  rubs murmurs or bruit GI: Abdomen is soft.  Nontender nondistended.  Bowel sounds are present normal.  No masses organomegaly Extremities: No edema.   Neurologic: Lethargic         Data Reviewed:   I have personally reviewed following labs and imaging studies   CBC: Recent Labs  Lab 04/06/21 0451  WBC 19.1*  HGB 14.5  HCT 43.2  MCV 96.9  PLT 220    Basic Metabolic Panel: Recent Labs  Lab 03/31/21 1854 03/31/21 1854 04/01/21 0447 04/01/21 1633 04/02/21 0833 04/03/21 0144 04/04/21 0546 04/05/21 0443 04/06/21 0451 04/06/21 1148 04/07/21 0432  NA  --    < > 139  --  137   < > 137 136 136  --  138  K  --    < > 3.7  --  4.0   < > 4.6 4.6 5.2* 5.4* 4.6  CL  --    < > 107  --  104   < > 102 102 102  --  102  CO2  --    < > 28  --  30   < > 29 30 30   --  30  GLUCOSE  --    < > 286*  --  184*   < > 193* 240* 298*  --  258*  BUN  --    < > 14  --  13   < > 16 19 28*  --  42*  CREATININE  --    < > 0.85  --  0.68   < > 0.72 0.69 0.91  --  1.01  CALCIUM  --    < > 9.5  --  9.1   < > 9.1 9.5 10.0  --  10.4*  MG 2.0  --  1.9 1.9 1.9  --  2.0  --  2.3  --   --   PHOS 1.5*  --  1.9* 2.5  --   --   --   --   --   --   --    < > = values in this interval not displayed.    Liver Function Tests: Recent Labs  Lab 04/01/21 0447  AST 35  ALT 40  ALKPHOS 115  BILITOT 0.5  PROT 4.3*  ALBUMIN 2.0*    CBG: Recent Labs  Lab 04/06/21 2341 04/07/21 0331 04/07/21 0810  GLUCAP 255* 245* 244*    Microbiology Studies:   Recent Results (from the past 240 hour(s))  CSF culture w Stat Gram Stain     Status: None   Collection Time: 03/30/21  1:48 PM   Specimen: CSF; Cerebrospinal Fluid  Result Value Ref Range Status   Specimen Description CSF  Final   Special Requests NONE  Final   Gram Stain NO WBC SEEN NO ORGANISMS SEEN CYTOSPIN SMEAR   Final   Culture   Final    NO GROWTH 3 DAYS Performed at [facility], [place] [address] [place], [place] [address]     Report Status 04/02/2021 FINAL  Final     Radiology Studies:  DG CHEST PORT 1 VIEW  Result Date: 04/06/2021 CLINICAL DATA:  Dyspnea EXAM: PORTABLE CHEST 1 VIEW COMPARISON:  04/14/2021 FINDINGS: Cardiac shadow is stable. Aortic calcifications are again noted. Feeding catheter extends into the stomach. Ventriculostomy catheter is noted in the right chest wall. The lungs are well aerated bilaterally with mild left basilar atelectasis. No sizable effusion is seen.  No bony abnormality is noted. IMPRESSION: Mild left basilar atelectasis. Electronically Signed   By: Inez Catalina M.D.   On: 04/06/2021 19:49     Scheduled Meds:   . amLODipine  10 mg Per Tube Daily  . Chlorhexidine Gluconate Cloth  6 each Topical Daily  . cyanocobalamin  1,000 mcg Intramuscular Q30 days  . feeding supplement (PROSource TF)  45 mL Per Tube Daily  . folic acid  1 mg Intravenous Daily  . insulin aspart  0-6 Units Subcutaneous Q4H  . insulin glargine  14 Units Subcutaneous Daily  . thiamine injection  100 mg Intravenous Daily    Continuous Infusions:   . feeding supplement (OSMOLITE 1.5 CAL) 1,000 mL (04/06/21 1255)     LOS: 11 days     Bonnielee Haff, [facility]    To contact the attending provider between 7A-7P or the covering provider during after hours 7P-7A, please log into the web site [email or web address] and access using universal [place] password for that web site. If you do not have the password, please call the hospital operator.  04/07/2021, 9:54 AM

## 2021-04-07 NOTE — Plan of Care (Signed)
  Problem: Education: Goal: Knowledge of General Education information will improve Description: Including pain rating scale, medication(s)/side effects and non-pharmacologic comfort measures Outcome: Not Progressing   Problem: Health Behavior/Discharge Planning: Goal: Ability to manage health-related needs will improve Outcome: Not Progressing   Problem: Activity: Goal: Risk for activity intolerance will decrease Outcome: Not Progressing   Problem: Nutrition: Goal: Adequate nutrition will be maintained Outcome: Not Progressing   Problem: Elimination: Goal: Will not experience complications related to bowel motility Outcome: Not Progressing Goal: Will not experience complications related to urinary retention Outcome: Not Progressing   Problem: Pain Managment: Goal: General experience of comfort will improve Outcome: Not Progressing   Problem: Safety: Goal: Ability to remain free from injury will improve Outcome: Not Progressing   Problem: Skin Integrity: Goal: Risk for impaired skin integrity will decrease Outcome: Not Progressing   Problem: Safety: Goal: Non-violent Restraint(s) Outcome: Not Progressing

## 2021-04-07 NOTE — Progress Notes (Signed)
Palliative:  HPI:87 y.o.malewith past medical history of hydrocephalus s/p VP shunt, hypertension, diabetes, GERD, vocal cord paralysis, hypercalcemia, BPH, recent stroke, dementia (per family report)admitted on 5/6/2022with altered mental status.Multiple health complications and infections over the 6 weeks prior to admission with progressing physical decline, weakness, and poor intake. Ongoing encephalopathy and not alert enough for po intake. Palliative care requested to assist with goals of care in setting of poor progress and poor prognosis.  I met today at Robert Gross's bedside and he has declined much over the past 2 days. I met with wife and 2 daughters and we discussed his decline and he is aspirating and unable to manage his own secretions. Family all confirm that they feel he is at end of life and now suffering. They were prepared to make decision for comfort as they did not see any other options but they are somewhat relieved that he has really already made this decision with his decline. We discussed removing feeding tube, no more labs/sticks, not focusing on numbers (labs/VSS), and providing medications to ensure his comfort as he needs them. We discussed liberal medications for pain, anxiety, agitation, and shortness of breath as needed. I explained that increasing oxygen often does not add to comfort but only to making a number looking better and that medication for breathing comfort is more effective for Robert Gross in the setting of respiratory decline. Family all understand and agree. Discussed liberalized visitation and will maintain Robert Gross here as he may not be stable for transfer to hospice. Family agree with plan for full comfort care here in the hospital.   All questions/concerns addressed. Emotional support provided.   Exam: Unresponsive. Copious secretions and appears to be aspirating on secretions. Breathing labored. Moaning and appears with generalized discomfort.    Plan: - Full comfort care.  - As needed medications ordered to ensure comfort. May titrate dosage/frequency as needed to achieve comfort.  - Anticipate hospital death.   [place], NP Palliative Medicine Team Pager [phone] (Please see [email or web address] for schedule) Team Phone [phone]    Greater than 50%  of this time was spent counseling and coordinating care related to the above assessment and plan

## 2021-04-07 NOTE — Progress Notes (Incomplete Revision)
PROGRESS NOTE   Robert Gross  DTO:[id number]    DOB: August 19, 1929    DOA: 04/06/2021  PCP: [name], MD    Chief Complaint  Patient presents with  . Altered Mental Status    Brief Narrative:  87 year old male with medical history significant for but not limited to communicating hydrocephalus s/p VP shunt, family reported dementia, hypertension, DM2, BPH, GERD, recent CVA, hyperlipidemia, presented with worsening confusion, weakness and poor oral intake.  He was reportedly doing well about 6 weeks ago, then sometime in March he was diagnosed with an ear infection and started on antibiotics and 2 weeks of prednisone.  During the steroid course, he had hypertension and hyperglycemia up to the 200s.  Once the prednisone was stopped, there was a sharp decline in his health and progressive weakness.  CT head for shunt follow-up 4/5 showed new subacute infarct.  Week prior to admission, he was also diagnosed with yeast UTI and started on fluconazole.  Seen by neurosurgery and VP shunt readjusted on 03/24/2021.  He progressively worsened, unable to get out of bed, difficulty eating.  Admitted initially for acute metabolic encephalopathy, hypernatremia, hypercalcemia, dehydration, suspected UTI.  He had complained of neck stiffness for 2 weeks at home, due to concern for CNS infection, empiric meningitis coverage was started and patient was transferred from [facility] to [facility] for further evaluation and management.  No acyclovir started due to low suspicion for herpes encephalitis.  Neurology and neurosurgery were consulted.  Patient underwent LP 5/9.  Ongoing AMS with unsafe oral intake.  Family opted for [facility] tube placement and tube feeds.   Assessment & Plan:   Acute metabolic encephalopathy Decline in mental status started approximately 6 weeks PTA with a more rapid decline over the past 2 weeks PTA when he became nonverbal and increasingly lethargic.  Over the past 6 weeks, he was treated  for an ear infection with cefdinir and a 12-day course of prednisone, UTI with Cipro and fluconazole and his VP shunt was readjusted by neurosurgery.   Patient underwent CT scan of the head which showed mildly dilated lateral ventricles.  No acute findings noted.  EEG did not show any epileptiform activity.   Vitamin B12 level was 356.  B1 level is 97.6 with a normal range being between 66-200. TSH 4.6.  Free T4 0.97.  Ammonia 28.  Urine culture without growth.  Blood cultures negative.  CSF work-up was unremarkable.  Antibiotics were discontinued. Patient remains on IV thiamine.   MRI brain was done which showed subacute to chronic infarct.  No acute findings noted.  Shunt setting was adjusted by neurosurgery on 5/11. Per neurology there is no further testing or interventions to be done at this time.  Neurology did discuss all the above with family members.   Initially it was felt that patient may take a long time to recover but patient has been getting worse over the last several days.  His mentation is worse over the last 48 hours.  He pulled out his core track feeding tube yesterday which had to be replaced.  He has been agitated and had to be restrained.  This morning noted to be hypoxic.  Patient appears to be actively declining.  Would be appropriate to transition to comfort care.  Palliative Care to meet with family this morning.  Folic acid deficiency Folate level noted to be 4.0.  Started on supplementation.  Leukocytosis Noted to be afebrile.  Could be reactive.  It is  likely that the patient is declining.  Chest x-ray done yesterday did not show any overt pneumonia.  Patient has been afebrile.  Neck rigidity/possible torticollis Evaluation negative for meningitis.  Symptomatic treatment with heating pads, topical menthol and Tylenol.  Avoid opiates or sedatives.   History of communicating hydrocephalus s/p VP shunt Neurosurgery had been following.  As per neurosurgery, he developed  bilateral subdural fluid collections necessitating adjustment of his shunt valve pressure higher.  His subdural collections essentially resolved but he continues to have mild ventriculomegaly.   Shunt valve was adjusted again on 5/11 to maximize flow.  AKI, mild Resolved.   Dehydration with hypernatremia: Serum sodium 150 on admission, likely related to poor oral intake.  Resolved.  Hypercalcemia Noted to have serum calcium of 11.5.  Resolved.    Hypokalemia/hypomagnesemia/hypophosphatemia Continue to monitor electrolytes.  Potassium noted to be 5.2.  We will recheck today.  Dysphagia Unsafe oral intake due to AMS.  Speech therapy recommended NPO and alternate means of nutrition.  After discussions with family core track feeding tube was placed and the patient was started on tube feedings. Primarily due to his altered mentation and n.p.o. status is recommended.  Tube feedings were continued.  He pulled out his feeding tube yesterday and had to be replaced.  Type II DM CBGs were noted to be elevated due to his tube feedings.  Started on Lantus.  Continue SSI.  HbA1c 9.3.  Lantus dose was adjusted.  Will not be too aggressive to avoid episodes of hypoglycemia. Effort from increasing the dose of Lantus today.  But we will see how the discussion goes between family and palliative care.  Questionable CVA Per CT head 03/16/2021: Stable appearance of hypodensity seen in the left anterior basal ganglia consistent with subacute infarction.  MRI brain shows subacute to chronic infarct.  No acute findings noted.  Essential hypertension Lisinopril was placed on hold due to AKI.  Amlodipine was initiated.  Amlodipine dose was increased for elevated blood pressure.  Continue to monitor for now.     Thrombocytopenia: Unclear etiology. Resolved.  Adult failure to thrive Likely multifactorial.    Urinary retention Had indwelling Foley catheter which was removed.  Monitor for retention.  Goals of  care Despite maximal treatment patient has not shown significant improvement.  Initially mentation was waxing and waning.  No further evaluation recommended by neurology or neurosurgery.  Patient was not able to safely swallow.  His mentation has become worse in the last 48 hours.  He is more hypoxic.  Palliative care to meet with family this morning.  Patient is appropriate for transition to comfort care.     DVT prophylaxis: Place and maintain sequential compression device Start: 03/29/21 0749     Code Status: DNR Family Communication: No family at bedside.   Disposition: SNF recommended by PT.  Status is: Inpatient  Remains inpatient appropriate because:Inpatient level of care appropriate due to severity of illness   Dispo: The patient is from: Home              Anticipated d/c is to: TBD              Patient currently is not medically stable to d/c.   Difficult to place patient No        Consultants:   Neurosurgery Neurology  Procedures:   LP by neurology 5/9.  Antimicrobials:    Anti-infectives (From admission, onward)   Start     Dose/Rate Route Frequency Ordered Stop  03/28/21 2200  cefTRIAXone (ROCEPHIN) 2 g in sodium chloride 0.9 % 100 mL IVPB  Status:  Discontinued        2 g 200 mL/hr over 30 Minutes Intravenous Every 12 hours 03/28/21 1244 03/31/21 0732   03/28/21 1800  vancomycin (VANCOREADY) IVPB 1000 mg/200 mL  Status:  Discontinued        1,000 mg 200 mL/hr over 60 Minutes Intravenous Every 24 hours 03/28/21 0006 03/28/21 0757   03/28/21 1600  vancomycin (VANCOREADY) IVPB 500 mg/100 mL  Status:  Discontinued        500 mg 100 mL/hr over 60 Minutes Intravenous Every 12 hours 03/28/21 1457 03/31/21 0732   03/28/21 1400  fluconazole (DIFLUCAN) IVPB 100 mg  Status:  Discontinued        100 mg 50 mL/hr over 60 Minutes Intravenous Every 24 hours 03/28/21 1239 03/30/21 1639   03/28/21 1330  vancomycin (VANCOREADY) IVPB 1000 mg/200 mL  Status:  Discontinued         1,000 mg 200 mL/hr over 60 Minutes Intravenous  Once 03/28/21 1244 03/28/21 1259   03/28/21 1300  ampicillin (OMNIPEN) 2 g in sodium chloride 0.9 % 100 mL IVPB  Status:  Discontinued        2 g 300 mL/hr over 20 Minutes Intravenous Every 6 hours 03/28/21 1244 03/31/21 0732   03/28/21 1000  fluconazole (DIFLUCAN) tablet 100 mg  Status:  Discontinued        100 mg Oral Daily 04/20/2021 2341 03/28/21 1239   03/28/21 0800  ceFEPIme (MAXIPIME) 2 g in sodium chloride 0.9 % 100 mL IVPB  Status:  Discontinued        2 g 200 mL/hr over 30 Minutes Intravenous Every 12 hours 03/28/21 0006 03/28/21 1244   03/28/21 0400  metroNIDAZOLE (FLAGYL) IVPB 500 mg  Status:  Discontinued        500 mg 100 mL/hr over 60 Minutes Intravenous Every 8 hours 04/12/2021 2346 03/28/21 0757   04/03/2021 1815  metroNIDAZOLE (FLAGYL) IVPB 500 mg        500 mg 100 mL/hr over 60 Minutes Intravenous  Once 04/18/2021 1805 03/24/2021 2049   03/25/2021 1815  ceFEPIme (MAXIPIME) 2 g in sodium chloride 0.9 % 100 mL IVPB        2 g 200 mL/hr over 30 Minutes Intravenous  Once 04/04/2021 1808 03/22/2021 2049   03/29/2021 1800  vancomycin (VANCOCIN) IVPB 1000 mg/200 mL premix        1,000 mg 200 mL/hr over 60 Minutes Intravenous  Once 04/15/2021 1752 04/03/2021 1951   04/03/2021 1515  cefTRIAXone (ROCEPHIN) 1 g in sodium chloride 0.9 % 100 mL IVPB  Status:  Discontinued        1 g 200 mL/hr over 30 Minutes Intravenous Every 24 hours 04/08/2021 1513 04/12/2021 1808        Subjective:  Patient is somnolent, mildly agitated.  In no distress.    Objective:   Vitals:   04/07/21 0613 04/07/21 0806 04/07/21 0830 04/07/21 0835  BP:  (!) 159/83    Pulse:  (!) 110    Resp:  19    Temp:  99.1 F (37.3 C)    TempSrc:  Axillary    SpO2:  (!) 88% 90% 91%  Weight: 56.6 kg     Height:        General appearance: Eyes open but does not respond.  Somewhat agitated. Resp: Coarse breath sounds bilaterally Cardio: S1-S2 is normal regular.  No  S3-S4.  No  rubs murmurs or bruit GI: Abdomen is soft.  Nontender nondistended.  Bowel sounds are present normal.  No masses organomegaly Extremities: No edema.   Neurologic: Lethargic         Data Reviewed:   I have personally reviewed following labs and imaging studies   CBC: Recent Labs  Lab 04/06/21 0451  WBC 19.1*  HGB 14.5  HCT 43.2  MCV 96.9  PLT 220    Basic Metabolic Panel: Recent Labs  Lab 03/31/21 1854 03/31/21 1854 04/01/21 0447 04/01/21 1633 04/02/21 0833 04/03/21 0144 04/04/21 0546 04/05/21 0443 04/06/21 0451 04/06/21 1148 04/07/21 0432  NA  --    < > 139  --  137   < > 137 136 136  --  138  K  --    < > 3.7  --  4.0   < > 4.6 4.6 5.2* 5.4* 4.6  CL  --    < > 107  --  104   < > 102 102 102  --  102  CO2  --    < > 28  --  30   < > 29 30 30   --  30  GLUCOSE  --    < > 286*  --  184*   < > 193* 240* 298*  --  258*  BUN  --    < > 14  --  13   < > 16 19 28*  --  42*  CREATININE  --    < > 0.85  --  0.68   < > 0.72 0.69 0.91  --  1.01  CALCIUM  --    < > 9.5  --  9.1   < > 9.1 9.5 10.0  --  10.4*  MG 2.0  --  1.9 1.9 1.9  --  2.0  --  2.3  --   --   PHOS 1.5*  --  1.9* 2.5  --   --   --   --   --   --   --    < > = values in this interval not displayed.    Liver Function Tests: Recent Labs  Lab 04/01/21 0447  AST 35  ALT 40  ALKPHOS 115  BILITOT 0.5  PROT 4.3*  ALBUMIN 2.0*    CBG: Recent Labs  Lab 04/06/21 2341 04/07/21 0331 04/07/21 0810  GLUCAP 255* 245* 244*    Microbiology Studies:   Recent Results (from the past 240 hour(s))  CSF culture w Stat Gram Stain     Status: None   Collection Time: 03/30/21  1:48 PM   Specimen: CSF; Cerebrospinal Fluid  Result Value Ref Range Status   Specimen Description CSF  Final   Special Requests NONE  Final   Gram Stain NO WBC SEEN NO ORGANISMS SEEN CYTOSPIN SMEAR   Final   Culture   Final    NO GROWTH 3 DAYS Performed at [facility], [place] [address] [place], [place] [address]     Report Status 04/02/2021 FINAL  Final     Radiology Studies:  DG CHEST PORT 1 VIEW  Result Date: 04/06/2021 CLINICAL DATA:  Dyspnea EXAM: PORTABLE CHEST 1 VIEW COMPARISON:  04/14/2021 FINDINGS: Cardiac shadow is stable. Aortic calcifications are again noted. Feeding catheter extends into the stomach. Ventriculostomy catheter is noted in the right chest wall. The lungs are well aerated bilaterally with mild left basilar atelectasis. No sizable effusion is seen.  No bony abnormality is noted. IMPRESSION: Mild left basilar atelectasis. Electronically Signed   By: Inez Catalina M.D.   On: 04/06/2021 19:49     Scheduled Meds:   . amLODipine  10 mg Per Tube Daily  . Chlorhexidine Gluconate Cloth  6 each Topical Daily  . cyanocobalamin  1,000 mcg Intramuscular Q30 days  . feeding supplement (PROSource TF)  45 mL Per Tube Daily  . folic acid  1 mg Intravenous Daily  . insulin aspart  0-6 Units Subcutaneous Q4H  . insulin glargine  14 Units Subcutaneous Daily  . thiamine injection  100 mg Intravenous Daily    Continuous Infusions:   . feeding supplement (OSMOLITE 1.5 CAL) 1,000 mL (04/06/21 1255)     LOS: 11 days     Bonnielee Haff, [facility]    To contact the attending provider between 7A-7P or the covering provider during after hours 7P-7A, please log into the web site [email or web address] and access using universal [place] password for that web site. If you do not have the password, please call the hospital operator.  04/07/2021, 9:54 AM

## 2021-04-08 DIAGNOSIS — I10 Essential (primary) hypertension: Secondary | ICD-10-CM | POA: Diagnosis not present

## 2021-04-08 DIAGNOSIS — G910 Communicating hydrocephalus: Secondary | ICD-10-CM | POA: Diagnosis not present

## 2021-04-08 DIAGNOSIS — E119 Type 2 diabetes mellitus without complications: Secondary | ICD-10-CM | POA: Diagnosis not present

## 2021-04-08 DIAGNOSIS — G9341 Metabolic encephalopathy: Secondary | ICD-10-CM | POA: Diagnosis not present

## 2021-04-08 DIAGNOSIS — Z515 Encounter for palliative care: Secondary | ICD-10-CM | POA: Diagnosis not present

## 2021-04-08 NOTE — Progress Notes (Signed)
Palliative:  HPI:87 y.o.malewith past medical history of hydrocephalus s/p VP shunt, hypertension, diabetes, GERD, vocal cord paralysis, hypercalcemia, BPH, recent stroke, dementia (per family report)admitted on 5/6/2022with altered mental status.Multiple health complications and infections over the 6 weeks prior to admission with progressing physical decline, weakness, and poor intake. Ongoing encephalopathy and not alert enough for po intake. Palliative care requested to assist with goals of care in setting of poor progress and poor prognosis.  I met today at Robert Gross's bedside with his wife. He is resting very comfortably and secretions much improved with Robinul. Breathing is very shallow. Mrs. Canela is very happy that he is receiving pain medication and appears comfortable. She is at peace with plan of care. She is very appreciative of the care that he has received here. I allowed her space to speak and she ponders about her life without her husband and the changes that await her. We discussed the importance of surrounding herself with her family, friends, neighbors and she looks forward to returning to her church (she has been unable to attend due to caring for her husband). She talks about her husband and the good man he has been his entire life.   All questions/concerns addressed. Emotional support provided.   Exam: Resting comfortably. No distress. Unresponsive. Breathing regular but shallow. Abd flat. Extremities warm to touch.   Plan: - Continue comfort meds - no changes to regimen.  - Anticipate hospital death.   15 min  Vinie Sill, NP Palliative Medicine Team Pager [phone] (Please see [email or web address] for schedule) Team Phone [phone]    Greater than 50%  of this time was spent counseling and coordinating care related to the above assessment and plan

## 2021-04-08 NOTE — Progress Notes (Signed)
PROGRESS NOTE  Robert Gross VPX:[id number] DOB: 1929-08-13 DOA: 13-Apr-2021 PCP: [name], MD   LOS: 12 days   Brief narrative: Patient is a 87 year old male history of communicating hydrocephalus s/p VP shunt, family reported dementia, hypertension, DM2, BPH, GERD, recent CVA, hyperlipidemia, presented to the the hospital with worsening confusion weakness and poor oral intake.  Patient had been on antibiotic and 2 weeks of prednisone before coming to the hospital for ear infection.  After the prednisone was stopped.  Decline in his health and patient had progressive weakness.  CT head scan showed new subacute infarct on 02/24/2021.  Patient was seen by neurosurgery and the patient was readjusted on 03/24/2021.  Patient progressively worsened and had difficulty ambulation.  Patient was initially admitted to the hospital for acute metabolic encephalopathy, hyponatremia, hypocalcemia, dehydration and suspected UTI with neck stiffness and concern for possible meningitis.  Empiric antibiotics were initiated and the patient was admitted to the hospital.  Neurology and neurosurgery were consulted.  Patient underwent lumbar puncture on 03/30/2021 but continues to have ongoing metabolic encephalopathy and unsafe oral intake.  Family initially opted for feeding tube placement and tube feeds.  At this time, goals of care has been discussed.  Palliative care on board.  Patient is full comfort care.  Assessment/Plan:  Principal Problem:   Acute metabolic encephalopathy Active Problems:   Essential hypertension   Diabetes mellitus without complication (HCC)   UTI (urinary tract infection)   Communicating hydrocephalus (HCC)   Hypercalcemia   Hypernatremia   History of CVA (cerebrovascular accident)  Acute metabolic encephalopathy Currently declining condition.  On comfort care.    Folic acid deficiency Folate level noted to be 4.0.  Currently on comfort care  Leukocytosis   Chest x-ray did  not show any overt pneumonia.  Patient has been afebrile.  Neck rigidity/possible torticollis LP was negative for meningitis.  History of communicating hydrocephalus s/p VP shunt Patient had recently been seen by neurosurgery for bilateral subdural fluid with need for adjustment of his shunt valve pressure.  His subdural collections essentially resolved but he continues to have mild ventriculomegaly.  Shunt valve was adjusted again on 5/11 to maximize flow.  AKI, mild Latest creatinine of 0.8  Dehydration with hypernatremia: Improved after hydration during hospitalization.  Hypercalcemia Noted to have serum calcium of 11.5 on presentation.  Improved subsequently.  No recent labs  Hypokalemia/hypomagnesemia/hypophosphatemia Replenished during hospitalization.  Dysphagia Unstable oral intake due to altered mental status.  Currently on comfort care  Type II DM No further monitoring  Questionable CVA  CT head 03/16/2021: Stable appearance of hypodensity seen in the left anterior basal ganglia consistent with subacute infarction.  MRI brain showed subacute to chronic infarct.    Essential hypertension    Not on antihypertensives at this time.  Thrombocytopenia: Unclear etiology.   Adult failure to thrive Likely multifactorial.    Urinary retention Had indwelling Foley catheter which was removed.    Currently on external Foley catheter for comfort  Goals of care Patient did not improve despite.  Maximal treatment with waxing and waning mental status.  No further evaluation recommended by neurology and neurosurgery and patient is unable to swallow.  Palliative care was consulted and plan was to transition to comfort care   DVT prophylaxis:  None comfort care   Code Status: DNR  Family Communication:.  None  Status is: Inpatient  Remains inpatient appropriate because:Full comfort care.   Dispo: The patient is from: Home  Anticipated d/c is to:  Undetermined likely in the hospital death              Patient currently is not medically stable to d/c.   Difficult to place patient No  Consultants:  Neurology  Neurosurgery  Palliative care  Procedures:  Lumbar puncture by neurology on 5/ 9  EEG 5/ 8  Anti-infectives:  . None  Anti-infectives (From admission, onward)   Start     Dose/Rate Route Frequency Ordered Stop   03/28/21 2200  cefTRIAXone (ROCEPHIN) 2 g in sodium chloride 0.9 % 100 mL IVPB  Status:  Discontinued        2 g 200 mL/hr over 30 Minutes Intravenous Every 12 hours 03/28/21 1244 03/31/21 0732   03/28/21 1800  vancomycin (VANCOREADY) IVPB 1000 mg/200 mL  Status:  Discontinued        1,000 mg 200 mL/hr over 60 Minutes Intravenous Every 24 hours 03/28/21 0006 03/28/21 0757   03/28/21 1600  vancomycin (VANCOREADY) IVPB 500 mg/100 mL  Status:  Discontinued        500 mg 100 mL/hr over 60 Minutes Intravenous Every 12 hours 03/28/21 1457 03/31/21 0732   03/28/21 1400  fluconazole (DIFLUCAN) IVPB 100 mg  Status:  Discontinued        100 mg 50 mL/hr over 60 Minutes Intravenous Every 24 hours 03/28/21 1239 03/30/21 1639   03/28/21 1330  vancomycin (VANCOREADY) IVPB 1000 mg/200 mL  Status:  Discontinued        1,000 mg 200 mL/hr over 60 Minutes Intravenous  Once 03/28/21 1244 03/28/21 1259   03/28/21 1300  ampicillin (OMNIPEN) 2 g in sodium chloride 0.9 % 100 mL IVPB  Status:  Discontinued        2 g 300 mL/hr over 20 Minutes Intravenous Every 6 hours 03/28/21 1244 03/31/21 0732   03/28/21 1000  fluconazole (DIFLUCAN) tablet 100 mg  Status:  Discontinued        100 mg Oral Daily 04/01/2021 2341 03/28/21 1239   03/28/21 0800  ceFEPIme (MAXIPIME) 2 g in sodium chloride 0.9 % 100 mL IVPB  Status:  Discontinued        2 g 200 mL/hr over 30 Minutes Intravenous Every 12 hours 03/28/21 0006 03/28/21 1244   03/28/21 0400  metroNIDAZOLE (FLAGYL) IVPB 500 mg  Status:  Discontinued        500 mg 100 mL/hr over 60 Minutes  Intravenous Every 8 hours 03/23/2021 2346 03/28/21 0757   04/17/2021 1815  metroNIDAZOLE (FLAGYL) IVPB 500 mg        500 mg 100 mL/hr over 60 Minutes Intravenous  Once 03/30/2021 1805 03/29/2021 2049   04/18/2021 1815  ceFEPIme (MAXIPIME) 2 g in sodium chloride 0.9 % 100 mL IVPB        2 g 200 mL/hr over 30 Minutes Intravenous  Once 04/10/2021 1808 03/26/2021 2049   04/20/2021 1800  vancomycin (VANCOCIN) IVPB 1000 mg/200 mL premix        1,000 mg 200 mL/hr over 60 Minutes Intravenous  Once 03/25/2021 1752 04/12/2021 1951   04/19/2021 1515  cefTRIAXone (ROCEPHIN) 1 g in sodium chloride 0.9 % 100 mL IVPB  Status:  Discontinued        1 g 200 mL/hr over 30 Minutes Intravenous Every 24 hours 03/24/2021 1513 04/07/2021 1808      Subjective: Today, patient was seen and examined at bedside.  Nonverbal, appears comfortable Objective: Vitals:   04/07/21 2015 04/08/21 0837  BP: 136/71 Marland Kitchen)  144/72  Pulse: (!) 116 (!) 116  Resp: 16 16  Temp: 98.2 F (36.8 C) (!) 97.3 F (36.3 C)  SpO2: 96% 97%    Intake/Output Summary (Last 24 hours) at 04/08/2021 1251 Last data filed at 04/07/2021 1850 Gross per 24 hour  Intake --  Output 450 ml  Net -450 ml   Filed Weights   04/03/21 0424 04/05/21 0500 04/07/21 0613  Weight: 62.3 kg 58.2 kg 56.6 kg   Body mass index is 18.97 kg/m.   Physical Exam: GENERAL: Patient is non verbal, appears comfortable, thinly built HENT: No scleral pallor or icterus. Pupils equally reactive to light. Oral mucosa is dry NECK: is supple, no gross swelling noted. CHEST: Clear to auscultation. No crackles or wheezes.  Diminished breath sounds bilaterally. CVS: S1 and S2 heard, no murmur. Regular rate and rhythm.  ABDOMEN: Soft, non-tender, bowel sounds are present. EXTREMITIES: No edema. CNS: Nonverbal,.  Lethargic SKIN: warm and dry without rashes.  Data Review: I have personally reviewed the following laboratory data and studies,  CBC: Recent Labs  Lab 04/06/21 0451  WBC 19.1*  HGB  14.5  HCT 43.2  MCV 96.9  PLT 035   Basic Metabolic Panel: Recent Labs  Lab 04/01/21 1633 04/02/21 0833 04/02/21 0833 04/03/21 0144 04/04/21 0546 04/05/21 0443 04/06/21 0451 04/06/21 1148 04/07/21 0432  NA  --  137   < > 136 137 136 136  --  138  K  --  4.0   < > 4.2 4.6 4.6 5.2* 5.4* 4.6  CL  --  104   < > 104 102 102 102  --  102  CO2  --  30   < > 30 29 30 30   --  30  GLUCOSE  --  184*   < > 222* 193* 240* 298*  --  258*  BUN  --  13   < > 16 16 19  28*  --  42*  CREATININE  --  0.68   < > 0.70 0.72 0.69 0.91  --  1.01  CALCIUM  --  9.1   < > 9.3 9.1 9.5 10.0  --  10.4*  MG 1.9 1.9  --   --  2.0  --  2.3  --   --   PHOS 2.5  --   --   --   --   --   --   --   --    < > = values in this interval not displayed.   Liver Function Tests: No results for input(s): AST, ALT, ALKPHOS, BILITOT, PROT, ALBUMIN in the last 168 hours. No results for input(s): LIPASE, AMYLASE in the last 168 hours. No results for input(s): AMMONIA in the last 168 hours. Cardiac Enzymes: No results for input(s): CKTOTAL, CKMB, CKMBINDEX, TROPONINI in the last 168 hours. BNP (last 3 results) No results for input(s): BNP in the last 8760 hours.  ProBNP (last 3 results) No results for input(s): PROBNP in the last 8760 hours.  CBG: Recent Labs  Lab 04/06/21 1604 04/06/21 1955 04/06/21 2341 04/07/21 0331 04/07/21 0810  GLUCAP 235* 226* 255* 245* 244*   Recent Results (from the past 240 hour(s))  CSF culture w Stat Gram Stain     Status: None   Collection Time: 03/30/21  1:48 PM   Specimen: CSF; Cerebrospinal Fluid  Result Value Ref Range Status   Specimen Description CSF  Final   Special Requests NONE  Final   Gram Stain NO  WBC SEEN NO ORGANISMS SEEN CYTOSPIN SMEAR   Final   Culture   Final    NO GROWTH 3 DAYS Performed at [facility], [place] [address], [place], [place] [address]    Report Status 04/02/2021 FINAL  Final     Studies: DG CHEST PORT 1 VIEW  Result Date:  04/06/2021 CLINICAL DATA:  Dyspnea EXAM: PORTABLE CHEST 1 VIEW COMPARISON:  04/03/2021 FINDINGS: Cardiac shadow is stable. Aortic calcifications are again noted. Feeding catheter extends into the stomach. Ventriculostomy catheter is noted in the right chest wall. The lungs are well aerated bilaterally with mild left basilar atelectasis. No sizable effusion is seen. No bony abnormality is noted. IMPRESSION: Mild left basilar atelectasis. Electronically Signed   By: Inez Catalina M.D.   On: 04/06/2021 19:49      Flora Lipps, MD  [facility] 04/08/2021  If 7PM-7AM, please contact night-coverage

## 2021-04-08 NOTE — Plan of Care (Signed)
  Problem: Education: Goal: Knowledge of General Education information will improve Description: Including pain rating scale, medication(s)/side effects and non-pharmacologic comfort measures Outcome: Not Progressing   Problem: Health Behavior/Discharge Planning: Goal: Ability to manage health-related needs will improve Outcome: Not Progressing   Problem: Nutrition: Goal: Adequate nutrition will be maintained Outcome: Not Progressing   Problem: Elimination: Goal: Will not experience complications related to bowel motility Outcome: Not Progressing Goal: Will not experience complications related to urinary retention Outcome: Not Progressing   Problem: Pain Managment: Goal: General experience of comfort will improve Outcome: Not Progressing   Problem: Safety: Goal: Ability to remain free from injury will improve Outcome: Not Progressing   Problem: Skin Integrity: Goal: Risk for impaired skin integrity will decrease Outcome: Not Progressing   Problem: Safety: Goal: Non-violent Restraint(s) Outcome: Not Progressing   Problem: Activity: Goal: Risk for activity intolerance will decrease Outcome: Not Progressing

## 2021-04-08 NOTE — Progress Notes (Signed)
This chaplain responded to the PMT referral for EOL spiritual care.  The Pt. wife-Robert Gross is bedside.  The Pt. preferred name is [name].  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  The chaplain understands Robert Gross is appreciative of the healthcare team and family participation as the Pt. transitioned to comfort care. Robert Gross added her church has been very supportive through phone calls.   The chaplain affirmed Robert Gross's role as the care giver and respected the times she chose to remain by the Pt. side. Robert Gross clearly articulated the Pt. choices for quality of life always included helping others.   Robert Gross accepted the chaplain's invitation for prayer and F/U spiritual care.

## 2021-04-09 DIAGNOSIS — I10 Essential (primary) hypertension: Secondary | ICD-10-CM | POA: Diagnosis not present

## 2021-04-09 DIAGNOSIS — G9341 Metabolic encephalopathy: Secondary | ICD-10-CM | POA: Diagnosis not present

## 2021-04-09 DIAGNOSIS — E119 Type 2 diabetes mellitus without complications: Secondary | ICD-10-CM | POA: Diagnosis not present

## 2021-04-09 DIAGNOSIS — G910 Communicating hydrocephalus: Secondary | ICD-10-CM | POA: Diagnosis not present

## 2021-04-09 NOTE — Progress Notes (Signed)
Palliative Medicine RN Note: Symptom check. Wife is at bedside.   Pt is having shallow respirations with apnea during my visit. His mouth is hanging open, and his eyes are not closed. He responds to touch and voice with a twitch of his fingers and slight opening of eyes. He cannot follow commands.   Mrs Richens is very pleased with the care he is getting here, but she is concerned that his mouth is dry. Placed order for q2h mouth sponging to address this concern.   I discussed Mr Pella with PMT providers. Due to his poor respiratory status, PMT recommends NOT attempting a move to hospice. His family is not willing to accept the high risk of death in the ambulance.  If he survives the night, I will follow up tomorrow am between 1000 and 1100 at wife's request.  Marjie Skiff. Kenard Morawski, RN, BSN, [facility] Palliative Medicine Team 04/09/2021 2:22 PM Office [phone]

## 2021-04-09 NOTE — Progress Notes (Signed)
PROGRESS NOTE  Robert BOOHER LOV:[id number] DOB: December 05, 1928 DOA: 04/05/2021 PCP: [name], MD   LOS: 13 days   Brief narrative: Patient is a 87 year old male history of communicating hydrocephalus s/p VP shunt, family reported dementia, hypertension, DM2, BPH, GERD, recent CVA, hyperlipidemia, presented to the the hospital with worsening confusion weakness and poor oral intake.  Patient had been on antibiotic and 2 weeks of prednisone before coming to the hospital for ear infection.  After the prednisone was stopped.  Decline in his health and patient had progressive weakness.  CT head scan showed new subacute infarct on 02/24/2021.  Patient was seen by neurosurgery and the patient was readjusted on 03/24/2021.  Patient progressively worsened and had difficulty ambulation.  Patient was initially admitted to the hospital for acute metabolic encephalopathy, hyponatremia, hypocalcemia, dehydration and suspected UTI with neck stiffness and concern for possible meningitis.  Empiric antibiotics were initiated and the patient was admitted to the hospital.  Neurology and neurosurgery were consulted.  Patient underwent lumbar puncture on 03/30/2021 but continues to have ongoing metabolic encephalopathy and unsafe oral intake.  Family initially opted for feeding tube placement and tube feeds.  At this time, goals of care has been discussed.  Palliative care on board.  Patient is full comfort care.  Assessment/Plan:  Principal Problem:   Acute metabolic encephalopathy Active Problems:   Essential hypertension   Diabetes mellitus without complication (HCC)   UTI (urinary tract infection)   Communicating hydrocephalus (HCC)   Hypercalcemia   Hypernatremia   History of CVA (cerebrovascular accident)  Acute metabolic encephalopathy Currently declining condition.  On comfort care.    Folic acid deficiency Folate level noted to be 4.0.  Currently on comfort care  Leukocytosis   Chest x-ray did  not show any overt pneumonia.  Patient has been afebrile.  Neck rigidity/possible torticollis LP was negative for meningitis.  History of communicating hydrocephalus s/p VP shunt Patient had recently been seen by neurosurgery for bilateral subdural fluid with need for adjustment of his shunt valve pressure.  His subdural collections essentially resolved but he continues to have mild ventriculomegaly.  Shunt valve was adjusted again on 5/11 to maximize flow.  AKI, mild Latest creatinine of 0.8.  Plan for further check.  Dehydration with hypernatremia: Improved after hydration during hospitalization.  Hypercalcemia Noted to have serum calcium of 11.5 on presentation.  Improved subsequently.  No recent labs  Hypokalemia/hypomagnesemia/hypophosphatemia Replenished during hospitalization.  Currently on comfort care  Dysphagia Unstable oral intake due to altered mental status.  Currently on comfort care  Type II DM No further monitoring  Questionable CVA  CT head 03/16/2021: Stable appearance of hypodensity seen in the left anterior basal ganglia consistent with subacute infarction.  MRI brain showed subacute to chronic infarct.    Essential hypertension    Not on antihypertensives at this time.  Thrombocytopenia: Unclear etiology.   Adult failure to thrive Likely multifactorial.    Urinary retention Had indwelling Foley catheter which was removed.    Currently on external Foley catheter for comfort  Goals of care Patient did not improve despite maximal treatment with waxing and waning mental status.  No further evaluation recommended by neurology and neurosurgery and patient is unable to swallow.  Palliative care was consulted and has been transitioned to comfort care   DVT prophylaxis:  None- comfort care   Code Status: DNR  Family Communication:.  Spoke with the patient's daughter at bedside. Status is: Inpatient  Remains inpatient appropriate because:Full  comfort care.   Dispo: The patient is from: Home              Anticipated d/c is to: Undetermined likely in the hospital death versus residential hospice.              Patient currently is not medically stable to d/c.   Difficult to place patient No  Consultants:  Neurology  Neurosurgery  Palliative care  Procedures:  Lumbar puncture by neurology on 5/ 9  EEG 5/ 8  Anti-infectives:  . None  Subjective: Today, patient was seen and examined at bedside.  Patient to be comfortable.  Patient's daughter at bedside. Objective: Vitals:   04/08/21 2351 04/09/21 1217  BP: (!) 157/100 (!) 162/87  Pulse: 93 (!) 115  Resp: 19 20  Temp: 97.9 F (36.6 C) 98.6 F (37 C)  SpO2: 97% 99%    Intake/Output Summary (Last 24 hours) at 04/09/2021 1313 Last data filed at 04/08/2021 2038 Gross per 24 hour  Intake --  Output 80 ml  Net -80 ml   Filed Weights   04/03/21 0424 04/05/21 0500 04/07/21 0613  Weight: 62.3 kg 58.2 kg 56.6 kg   Body mass index is 18.97 kg/m.   Physical Exam: GENERAL: Patient appears comfortable, thinly built, HENT: No scleral pallor or icterus. Pupils equally reactive to light. Oral mucosa is dry NECK: is supple, no gross swelling noted. CHEST: Clear to auscultation. No crackles or wheezes.  Diminished breath sounds bilaterally. CVS: S1 and S2 heard, no murmur. Regular rate and rhythm.  ABDOMEN: Soft, non-tender, bowel sounds are present. EXTREMITIES: No edema. CNS: Nonverbal, mildly alert and awake SKIN: warm and dry without rashes.   Data Review: I have personally reviewed the following laboratory data and studies,  CBC: Recent Labs  Lab 04/06/21 0451  WBC 19.1*  HGB 14.5  HCT 43.2  MCV 96.9  PLT 017   Basic Metabolic Panel: Recent Labs  Lab 04/03/21 0144 04/04/21 0546 04/05/21 0443 04/06/21 0451 04/06/21 1148 04/07/21 0432  NA 136 137 136 136  --  138  K 4.2 4.6 4.6 5.2* 5.4* 4.6  CL 104 102 102 102  --  102  CO2 30 29 30 30   --   30  GLUCOSE 222* 193* 240* 298*  --  258*  BUN 16 16 19  28*  --  42*  CREATININE 0.70 0.72 0.69 0.91  --  1.01  CALCIUM 9.3 9.1 9.5 10.0  --  10.4*  MG  --  2.0  --  2.3  --   --    Liver Function Tests: No results for input(s): AST, ALT, ALKPHOS, BILITOT, PROT, ALBUMIN in the last 168 hours. No results for input(s): LIPASE, AMYLASE in the last 168 hours. No results for input(s): AMMONIA in the last 168 hours. Cardiac Enzymes: No results for input(s): CKTOTAL, CKMB, CKMBINDEX, TROPONINI in the last 168 hours. BNP (last 3 results) No results for input(s): BNP in the last 8760 hours.  ProBNP (last 3 results) No results for input(s): PROBNP in the last 8760 hours.  CBG: Recent Labs  Lab 04/06/21 1604 04/06/21 1955 04/06/21 2341 04/07/21 0331 04/07/21 0810  GLUCAP 235* 226* 255* 245* 244*   Recent Results (from the past 240 hour(s))  CSF culture w Stat Gram Stain     Status: None   Collection Time: 03/30/21  1:48 PM   Specimen: CSF; Cerebrospinal Fluid  Result Value Ref Range Status   Specimen Description CSF  Final  Special Requests NONE  Final   Gram Stain NO WBC SEEN NO ORGANISMS SEEN CYTOSPIN SMEAR   Final   Culture   Final    NO GROWTH 3 DAYS Performed at [facility], [place] [address], [place], [place] [address]    Report Status 04/02/2021 FINAL  Final     Studies: No results found.    Flora Lipps, MD  [facility] 04/09/2021  If 7PM-7AM, please contact night-coverage

## 2021-04-09 NOTE — Progress Notes (Signed)
Family in room with patient. No needs expressed. Daughter stated she believes he is resting peacefully and not in any pain. This RN comforted family and opened direct  Communication for any needs they may have. Will continue to monitor.

## 2021-04-09 NOTE — Plan of Care (Signed)
  Problem: Education: Goal: Knowledge of General Education information will improve Description: Including pain rating scale, medication(s)/side effects and non-pharmacologic comfort measures Outcome: Progressing   Problem: Health Behavior/Discharge Planning: Goal: Ability to manage health-related needs will improve Outcome: Progressing   Problem: Activity: Goal: Risk for activity intolerance will decrease Outcome: Progressing   Problem: Nutrition: Goal: Adequate nutrition will be maintained Outcome: Progressing   Problem: Elimination: Goal: Will not experience complications related to bowel motility Outcome: Progressing Goal: Will not experience complications related to urinary retention Outcome: Progressing   Problem: Pain Managment: Goal: General experience of comfort will improve Outcome: Progressing   Problem: Safety: Goal: Ability to remain free from injury will improve Outcome: Progressing   Problem: Skin Integrity: Goal: Risk for impaired skin integrity will decrease Outcome: Progressing   Problem: Safety: Goal: Non-violent Restraint(s) Outcome: Progressing

## 2021-04-09 NOTE — Plan of Care (Signed)
  Problem: Education: Goal: Knowledge of General Education information will improve Description: Including pain rating scale, medication(s)/side effects and non-pharmacologic comfort measures Outcome: Progressing   Problem: Health Behavior/Discharge Planning: Goal: Ability to manage health-related needs will improve Outcome: Progressing   Problem: Activity: Goal: Risk for activity intolerance will decrease Outcome: Progressing   Problem: Nutrition: Goal: Adequate nutrition will be maintained Outcome: Progressing   Problem: Elimination: Goal: Will not experience complications related to bowel motility Outcome: Progressing Goal: Will not experience complications related to urinary retention Outcome: Progressing   Problem: Pain Managment: Goal: General experience of comfort will improve Outcome: Progressing   Problem: Safety: Goal: Ability to remain free from injury will improve Outcome: Progressing   Problem: Skin Integrity: Goal: Risk for impaired skin integrity will decrease Outcome: Progressing   

## 2021-04-10 DIAGNOSIS — G9341 Metabolic encephalopathy: Secondary | ICD-10-CM | POA: Diagnosis not present

## 2021-04-10 DIAGNOSIS — E119 Type 2 diabetes mellitus without complications: Secondary | ICD-10-CM | POA: Diagnosis not present

## 2021-04-10 DIAGNOSIS — G910 Communicating hydrocephalus: Secondary | ICD-10-CM | POA: Diagnosis not present

## 2021-04-10 DIAGNOSIS — I10 Essential (primary) hypertension: Secondary | ICD-10-CM | POA: Diagnosis not present

## 2021-04-10 NOTE — Progress Notes (Signed)
Palliative Medicine RN Note: Symptom check. Pt is more alert today. However, he does not respond to me, only to his family and to gentle sternal rub during questioning. PMT continues to support allowing a hospital death due to family fear of death during transport and poor respiratory status.   Our team will continue to follow to ensure patient remains comfortable.  Marjie Skiff Yanice Maqueda, RN, BSN, [facility] Palliative Medicine Team 04/10/2021 10:32 AM Office [phone]

## 2021-04-10 NOTE — Progress Notes (Signed)
PROGRESS NOTE  Robert Gross KVQ:[id number] DOB: [birth year] DOA: 04-21-2021 PCP: [name], MD   LOS: 14 days   Brief narrative: Patient is a 87 year old male history of communicating hydrocephalus s/p VP shunt, family reported dementia, hypertension, DM2, BPH, GERD, recent CVA, hyperlipidemia, presented to the the hospital with worsening confusion weakness and poor oral intake.  Patient had been on antibiotic and 2 weeks of prednisone before coming to the hospital for ear infection.  After the prednisone was stopped.  Decline in his health and patient had progressive weakness.  CT head scan showed new subacute infarct on 02/24/2021.  Patient was seen by neurosurgery and the patient was readjusted on 03/24/2021.  Patient progressively worsened and had difficulty ambulation.  Patient was initially admitted to the hospital for acute metabolic encephalopathy, hyponatremia, hypocalcemia, dehydration and suspected UTI with neck stiffness and concern for possible meningitis.  Empiric antibiotics were initiated and the patient was admitted to the hospital.  Neurology and neurosurgery were consulted.  Patient underwent lumbar puncture on 03/30/2021 but continues to have ongoing metabolic encephalopathy and unsafe oral intake.  Family initially opted for feeding tube placement and tube feeds.  At this time, goals of care has been discussed.  Palliative care on board.  Patient is full comfort care anticipating hospital death.  Assessment/Plan:  Principal Problem:   Acute metabolic encephalopathy Active Problems:   Essential hypertension   Diabetes mellitus without complication (HCC)   UTI (urinary tract infection)   Communicating hydrocephalus (HCC)   Hypercalcemia   Hypernatremia   History of CVA (cerebrovascular accident)  Acute metabolic encephalopathy Currently declining condition.  On comfort care.    Folic acid deficiency Folate level noted to be 4.0.  Currently on comfort  care  Leukocytosis   Chest x-ray did not show any overt pneumonia.  Patient has been afebrile.  Neck rigidity/possible torticollis LP was negative for meningitis.  History of communicating hydrocephalus s/p VP shunt Patient had recently been seen by neurosurgery for bilateral subdural fluid with need for adjustment of his shunt valve pressure.  His subdural collections essentially resolved but he continues to have mild ventriculomegaly.  Shunt valve was adjusted again on 5/11 to maximize flow.  AKI, mild Latest creatinine of 0.8.  Plan for further check.  Dehydration with hypernatremia: Improved after hydration during hospitalization.  Hypercalcemia Noted to have serum calcium of 11.5 on presentation.  Improved subsequently.  No recent labs  Hypokalemia/hypomagnesemia/hypophosphatemia Replenished during hospitalization.  Currently on comfort care  Dysphagia Unstable oral intake due to altered mental status.  Currently on comfort care  Type II DM No further monitoring  Questionable CVA  CT head 03/16/2021: Stable appearance of hypodensity seen in the left anterior basal ganglia consistent with subacute infarction.  MRI brain showed subacute to chronic infarct.    Essential hypertension    Not on antihypertensives at this time.  Thrombocytopenia: Unclear etiology.   Adult failure to thrive Likely multifactorial.    Urinary retention Had indwelling Foley catheter which was removed.    Currently on external Foley catheter for comfort  Goals of care Patient did not improve despite maximal treatment with waxing and waning mental status.  No further evaluation recommended by neurology and neurosurgery and patient is unable to swallow.  Palliative care was consulted and has been transitioned to comfort care   DVT prophylaxis:  None- comfort care   Code Status: DNR  Family Communication:.  None today spoke with the patient's daughter at bedside  yesterday. . Status  is: Inpatient  Remains inpatient appropriate because:Full comfort care.   Dispo: The patient is from: Home              Anticipated d/c is to: hospital death              Patient currently is not medically stable to d/c.   Difficult to place patient No  Consultants:  Neurology  Neurosurgery  Palliative care  Procedures:  Lumbar puncture by neurology on 5/ 9  EEG 5/ 8  Anti-infectives:  . None  Subjective: Patient was seen and examined at bedside.  Lethargic only opens eyes.  Appears comfortable  Objective: Vitals:   04/09/21 2030 04/10/21 0801  BP:  140/89  Pulse:  (!) 108  Resp: 20 14  Temp:  98.5 F (36.9 C)  SpO2:  98%    Intake/Output Summary (Last 24 hours) at 04/10/2021 1315 Last data filed at 04/10/2021 0413 Gross per 24 hour  Intake --  Output 400 ml  Net -400 ml   Filed Weights   04/03/21 0424 04/05/21 0500 04/07/21 0613  Weight: 62.3 kg 58.2 kg 56.6 kg   Body mass index is 18.97 kg/m.   Physical Exam: General: Thinly built, not in obvious distress, appears comfortable, lethargic HENT:   No scleral pallor or icterus noted. Oral mucosa is dry Chest:  Clear breath sounds.  Diminished breath sounds bilaterally.  CVS: S1 &S2 heard. No murmur.  Regular rate and rhythm. Abdomen: Soft, nontender, nondistended.  Bowel sounds are heard.   Extremities: No cyanosis, clubbing or edema.  Peripheral pulses are palpable. Psych: Lethargic CNS: Lethargic Skin: Warm and dry.  No rashes noted.  Data Review: I have personally reviewed the following laboratory data and studies,  CBC: Recent Labs  Lab 04/06/21 0451  WBC 19.1*  HGB 14.5  HCT 43.2  MCV 96.9  PLT 675   Basic Metabolic Panel: Recent Labs  Lab 04/04/21 0546 04/05/21 0443 04/06/21 0451 04/06/21 1148 04/07/21 0432  NA 137 136 136  --  138  K 4.6 4.6 5.2* 5.4* 4.6  CL 102 102 102  --  102  CO2 29 30 30   --  30  GLUCOSE 193* 240* 298*  --  258*  BUN 16 19 28*   --  42*  CREATININE 0.72 0.69 0.91  --  1.01  CALCIUM 9.1 9.5 10.0  --  10.4*  MG 2.0  --  2.3  --   --    Liver Function Tests: No results for input(s): AST, ALT, ALKPHOS, BILITOT, PROT, ALBUMIN in the last 168 hours. No results for input(s): LIPASE, AMYLASE in the last 168 hours. No results for input(s): AMMONIA in the last 168 hours. Cardiac Enzymes: No results for input(s): CKTOTAL, CKMB, CKMBINDEX, TROPONINI in the last 168 hours. BNP (last 3 results) No results for input(s): BNP in the last 8760 hours.  ProBNP (last 3 results) No results for input(s): PROBNP in the last 8760 hours.  CBG: Recent Labs  Lab 04/06/21 1604 04/06/21 1955 04/06/21 2341 04/07/21 0331 04/07/21 0810  GLUCAP 235* 226* 255* 245* 244*   No results found for this or any previous visit (from the past 240 hour(s)).   Studies: No results found.    Flora Lipps, MD  [facility] 04/10/2021  If 7PM-7AM, please contact night-coverage

## 2021-04-10 NOTE — Plan of Care (Signed)
  Problem: Education: Goal: Knowledge of General Education information will improve Description: Including pain rating scale, medication(s)/side effects and non-pharmacologic comfort measures Outcome: Progressing   Problem: Health Behavior/Discharge Planning: Goal: Ability to manage health-related needs will improve Outcome: Progressing   Problem: Activity: Goal: Risk for activity intolerance will decrease Outcome: Progressing   Problem: Nutrition: Goal: Adequate nutrition will be maintained Outcome: Progressing   Problem: Elimination: Goal: Will not experience complications related to bowel motility Outcome: Progressing Goal: Will not experience complications related to urinary retention Outcome: Progressing   

## 2021-04-11 DIAGNOSIS — Z8673 Personal history of transient ischemic attack (TIA), and cerebral infarction without residual deficits: Secondary | ICD-10-CM | POA: Diagnosis not present

## 2021-04-11 DIAGNOSIS — G9341 Metabolic encephalopathy: Secondary | ICD-10-CM | POA: Diagnosis not present

## 2021-04-11 DIAGNOSIS — G910 Communicating hydrocephalus: Secondary | ICD-10-CM | POA: Diagnosis not present

## 2021-04-11 DIAGNOSIS — Z515 Encounter for palliative care: Secondary | ICD-10-CM | POA: Diagnosis not present

## 2021-04-11 NOTE — Progress Notes (Signed)
PROGRESS NOTE  Robert Gross ATF:[id number] DOB: Nov 07, 1929 DOA: 04-08-21 PCP: [name], MD   LOS: 15 days   Brief narrative: Patient is a 87 year old male history of communicating hydrocephalus s/p VP shunt, family reported dementia, hypertension, DM2, BPH, GERD, recent CVA, hyperlipidemia, presented to the the hospital with worsening confusion weakness and poor oral intake.  Patient had been on antibiotic and 2 weeks of prednisone before coming to the hospital for ear infection.  After the prednisone was stopped.  Decline in his health and patient had progressive weakness.  CT head scan showed new subacute infarct on 02/24/2021.  Patient was seen by neurosurgery and the patient was readjusted on 03/24/2021.  Patient progressively worsened and had difficulty ambulation.  Patient was initially admitted to the hospital for acute metabolic encephalopathy, hyponatremia, hypocalcemia, dehydration and suspected UTI with neck stiffness and concern for possible meningitis.  Empiric antibiotics were initiated and the patient was admitted to the hospital.  Neurology and neurosurgery were consulted.  Patient underwent lumbar puncture on 03/30/2021 but continues to have ongoing metabolic encephalopathy and unsafe oral intake.  Family initially opted for feeding tube placement and tube feeds.  At this time, goals of care has been discussed.  Palliative care on board.  Patient is full comfort care anticipating hospital death.  Assessment/Plan:  Principal Problem:   Acute metabolic encephalopathy Active Problems:   Essential hypertension   Diabetes mellitus without complication (HCC)   UTI (urinary tract infection)   Communicating hydrocephalus (HCC)   Hypercalcemia   Hypernatremia   History of CVA (cerebrovascular accident)   End of life care  Acute metabolic encephalopathy Currently declining condition.  On comfort care.    Folic acid deficiency Folate level noted to be 4.0.  Currently on  comfort care  Leukocytosis   Chest x-ray did not show any overt pneumonia.  Patient has been afebrile.  Neck rigidity/possible torticollis LP was negative for meningitis.  History of communicating hydrocephalus s/p VP shunt Patient had recently been seen by neurosurgery for bilateral subdural fluid with need for adjustment of his shunt valve pressure.  His subdural collections essentially resolved but he continues to have mild ventriculomegaly.  Shunt valve was adjusted again on 5/11 to maximize flow.  AKI, mild Latest creatinine of 0.8.  Plan for further check.  Dehydration with hypernatremia: Improved after hydration during hospitalization.  Hypercalcemia Noted to have serum calcium of 11.5 on presentation.  Improved subsequently.  No recent labs  Hypokalemia/hypomagnesemia/hypophosphatemia Replenished during hospitalization.  Currently on comfort care  Dysphagia Unstable oral intake due to altered mental status.  Currently on comfort care  Type II DM No further monitoring  Questionable CVA  CT head 03/16/2021: Stable appearance of hypodensity seen in the left anterior basal ganglia consistent with subacute infarction.  MRI brain showed subacute to chronic infarct.    Essential hypertension    Not on antihypertensives at this time.  Thrombocytopenia: Unclear etiology.   Adult failure to thrive Likely multifactorial.    Urinary retention Had indwelling Foley catheter which was removed.    Currently on external Foley catheter for comfort  Goals of care Patient did not improve despite maximal treatment with waxing and waning mental status.  No further evaluation recommended by neurology and neurosurgery and patient is unable to swallow.  Palliative care on board and is on comfort care   DVT prophylaxis:  None- comfort care    Code Status: DNR  Family Communication:.  None today.  . Status is: Inpatient  Remains inpatient appropriate because:Full  comfort care.   Dispo: The patient is from: Home              Anticipated d/c is to: hospital death              Patient currently is not medically stable to d/c.   Difficult to place patient No  Consultants:  Neurology  Neurosurgery  Palliative care  Procedures:  Lumbar puncture by neurology on 5/ 9  EEG 5/ 8  Anti-infectives:  . None  Subjective: Patient was seen and examined at bedside.  Lethargic, opens eyes only, appears comfortable.   Objective: Vitals:   04/10/21 2140 04/11/21 0825  BP: 137/79 134/87  Pulse: (!) 109 (!) 105  Resp:  15  Temp: (!) 97.5 F (36.4 C) (!) 97.1 F (36.2 C)  SpO2: 98% 100%    Intake/Output Summary (Last 24 hours) at 04/11/2021 1452 Last data filed at 04/10/2021 1500 Gross per 24 hour  Intake --  Output 300 ml  Net -300 ml   Filed Weights   04/03/21 0424 04/05/21 0500 04/07/21 0613  Weight: 62.3 kg 58.2 kg 56.6 kg   Body mass index is 18.97 kg/m.   Physical Exam:  General: Thinly built, not in obvious distress, appears comfortable, lethargic, opens eyes.  Extremely ill and deconditioned. HENT:   No scleral pallor or icterus noted. Oral mucosa is dry Chest:  Clear breath sounds.  Diminished breath sounds bilaterally.  CVS: S1 &S2 heard. No murmur.  Regular rate and rhythm. Abdomen: Soft, nontender, nondistended.  Bowel sounds are heard.   Extremities: No cyanosis, clubbing or edema.  Peripheral pulses are palpable. Psych: Lethargic CNS: Lethargic Skin: Warm and dry.  No rashes noted.  Data Review: I have personally reviewed the following laboratory data and studies,  CBC: Recent Labs  Lab 04/06/21 0451  WBC 19.1*  HGB 14.5  HCT 43.2  MCV 96.9  PLT 659   Basic Metabolic Panel: Recent Labs  Lab 04/05/21 0443 04/06/21 0451 04/06/21 1148 04/07/21 0432  NA 136 136  --  138  K 4.6 5.2* 5.4* 4.6  CL 102 102  --  102  CO2 30 30  --  30  GLUCOSE 240* 298*  --  258*  BUN 19 28*  --  42*  CREATININE 0.69  0.91  --  1.01  CALCIUM 9.5 10.0  --  10.4*  MG  --  2.3  --   --    Liver Function Tests: No results for input(s): AST, ALT, ALKPHOS, BILITOT, PROT, ALBUMIN in the last 168 hours. No results for input(s): LIPASE, AMYLASE in the last 168 hours. No results for input(s): AMMONIA in the last 168 hours. Cardiac Enzymes: No results for input(s): CKTOTAL, CKMB, CKMBINDEX, TROPONINI in the last 168 hours. BNP (last 3 results) No results for input(s): BNP in the last 8760 hours.  ProBNP (last 3 results) No results for input(s): PROBNP in the last 8760 hours.  CBG: Recent Labs  Lab 04/06/21 1604 04/06/21 1955 04/06/21 2341 04/07/21 0331 04/07/21 0810  GLUCAP 235* 226* 255* 245* 244*   No results found for this or any previous visit (from the past 240 hour(s)).   Studies: No results found.    Flora Lipps, MD  [facility] 04/11/2021  If 7PM-7AM, please contact night-coverage

## 2021-04-11 NOTE — Progress Notes (Signed)
Palliative Medicine Progress Note  per Vinie Sill NP palliative progress note->HPI: 87 y.o.malewith past medical history of hydrocephalus s/p VP shunt, hypertension, diabetes, GERD, vocal cord paralysis, hypercalcemia, BPH, recent stroke, dementia (per family report)admitted on 5/6/2022with altered mental status.Multiple health complications and infections over the 6 weeks prior to admission with progressing physical decline, weakness, and poor intake. Ongoing encephalopathy and not alert enough for po intake. Palliative care requested to assist with goals of care in setting of poor progress and poor prognosis.  Subjective:  Assessed patient and met the bedside with patient's wife and two daughters. He is mouth breathing and unresponsive today. Family states that he is no longer responding to them either. They speak of how difficult it is to watch him and now have less interactions with him. Family agrees he does appear to be comfortable, for which they are grateful.  Discussed EOL expectations and natural trajectory. Provided "Gone From My Sight" booklet for further review. Questions and concerns addressed. PMT will continue to support holistically.     Physical Exam: resting comfortably in NAD. Unresponsive. Breathing is shallow and regular on 2L [place]. Skin is cool and dry.   Assessment: EOL care  Plan:  -continue medications for comfort, no changes today -psychosocial and emotional support provided -Gone From My Sight booklet provided -PMT will continue to follow for symptom management  Total time: 15 minutes Greater than 50% of this time was spent in counseling and coordinating care related to the above assessment and plan.  Dorthy Cooler, PA-C Palliative Medicine Team Team phone # [phone]  Thank you for allowing the Palliative Medicine Team to assist in the care of this patient. Please utilize secure chat with additional questions, if there is no response within 30  minutes please call the above phone number.  Palliative Medicine Team providers are available by phone from 7am to 7pm daily and can be reached through the team cell phone.  Should this patient require assistance outside of these hours, please call the patient's attending physician.

## 2021-04-12 DIAGNOSIS — Z982 Presence of cerebrospinal fluid drainage device: Secondary | ICD-10-CM

## 2021-04-12 DIAGNOSIS — G9341 Metabolic encephalopathy: Secondary | ICD-10-CM | POA: Diagnosis not present

## 2021-04-12 DIAGNOSIS — Z515 Encounter for palliative care: Secondary | ICD-10-CM | POA: Diagnosis not present

## 2021-04-12 NOTE — Progress Notes (Signed)
Palliative Medicine Progress Note  HPI: 87 y.o.malewith past medical history of hydrocephalus s/p VP shunt, hypertension, diabetes, GERD, vocal cord paralysis, hypercalcemia, BPH, recent stroke, dementia (per family report)admitted on 5/6/2022with altered mental status.Multiple health complications and infections over the 6 weeks prior to admission with progressing physical decline, weakness, and poor intake. Ongoing encephalopathy and not alert enough for po intake. Palliative care requested to assist with goals of care in setting of poor progress and poor prognosis.  Subjective:  Assessed patient at the bedside and met with patient's wife and daughter. He continues mouth breathing, regular and shallow, and appears comfortable overall. He is minimally responsive today, although he does react to touch with a slight groan. Discussed finding of tachycardia with patient's family. Education provided on end of life expectations and family verbalizes understanding. They are agreeable with continuing current plan of care, providing medications for pain or anxiety on an as needed basis.   Questions and concerns addressed. PMT will continue to support holistically and ensure patient remains comfortable.  Physical Exam: resting comfortably, no acute distress. Eyelids remain open, mouth breathing is unchanged - shallow and regular. Upper extremities are warm and dry, lower extremities are cool and dry. Minimally responsive with facial expressions and slight finger movements. Tachycardic.  Assessment: EOL care  Plan: -weaned [place] O2 from 3L to 2L, no escalation. Use medications for dyspnea/air hunger -continue medications for comfort -Ongoing symptom management   Total time: 15 minutes Greater than 50% of this time was spent in counseling and coordinating care related to the above assessment and plan.  Dorthy Cooler, PA-C Palliative Medicine Team Team phone # [phone]  Thank you for  allowing the Palliative Medicine Team to assist in the care of this patient. Please utilize secure chat with additional questions, if there is no response within 30 minutes please call the above phone number.  Palliative Medicine Team providers are available by phone from 7am to 7pm daily and can be reached through the team cell phone.  Should this patient require assistance outside of these hours, please call the patient's attending physician.

## 2021-04-12 NOTE — Progress Notes (Signed)
PROGRESS NOTE  Robert Gross ERX:[id number] DOB: [birth year] DOA: 04/09/2021 PCP: [name], MD   LOS: 16 days   Brief narrative: Patient is a 87 year old male history of communicating hydrocephalus s/p VP shunt, family reported dementia, hypertension, DM2, BPH, GERD, recent CVA, hyperlipidemia, presented to the the hospital with worsening confusion weakness and poor oral intake.  Patient had been on antibiotic and 2 weeks of prednisone before coming to the hospital for ear infection.  After the prednisone was stopped, he had decline in his health and patient had progressive weakness.  CT head scan showed new subacute infarct on 02/24/2021.  Patient was seen by neurosurgery and the patient was readjusted on 03/24/2021.  Patient progressively worsened and had difficulty ambulation.  Patient was initially admitted to the hospital for acute metabolic encephalopathy, hyponatremia, hypocalcemia, dehydration and suspected UTI with neck stiffness and concern for possible meningitis.  Empiric antibiotics were initiated and the patient was admitted to the hospital.  Neurology and neurosurgery were consulted.  Patient underwent lumbar puncture on 03/30/2021 but continues to have ongoing metabolic encephalopathy and unsafe oral intake.  Family initially opted for feeding tube placement and tube feeds.  At this time, patient is full comfort care anticipating hospital death.  Assessment/Plan:  Principal Problem:   Acute metabolic encephalopathy Active Problems:   Essential hypertension   Diabetes mellitus without complication (HCC)   UTI (urinary tract infection)   Communicating hydrocephalus (HCC)   Hypercalcemia   Hypernatremia   History of CVA (cerebrovascular accident)   End of life care   S/P VP shunt  Acute metabolic encephalopathy Currently declining condition.  On comfort care.    Folic acid deficiency Folate level noted to be 4.0.  Currently on comfort care  Leukocytosis   Chest  x-ray did not show any overt pneumonia.  Patient has been afebrile.  Neck rigidity/possible torticollis LP was negative for meningitis.  History of communicating hydrocephalus s/p VP shunt Patient had recently been seen by neurosurgery for bilateral subdural fluid with need for adjustment of his shunt valve pressure.  His subdural collections essentially resolved but he continues to have mild ventriculomegaly.  Shunt valve was adjusted again on 5/11 to maximize flow.  AKI, mild Latest creatinine of 0.8.  Plan for further check.  Dehydration with hypernatremia: Improved after hydration during hospitalization.  Hypercalcemia Noted to have serum calcium of 11.5 on presentation.  Improved subsequently.  No recent labs  Hypokalemia/hypomagnesemia/hypophosphatemia Replenished during hospitalization.  Currently on comfort care  Dysphagia Unstable oral intake due to altered mental status.  Currently on comfort care  Type II DM No further monitoring  Questionable CVA  CT head 03/16/2021: Stable appearance of hypodensity seen in the left anterior basal ganglia consistent with subacute infarction.  MRI brain showed subacute to chronic infarct.    Essential hypertension    Not on antihypertensives at this time.  Thrombocytopenia: Unclear etiology.   Adult failure to thrive Likely multifactorial.    Urinary retention Had indwelling Foley catheter which was removed.    Currently on external Foley catheter for comfort  Goals of care   Palliative care on board and is on comfort care   DVT prophylaxis:  None- comfort care    Code Status: DNR  Family Communication:.  None today.  . Status is: Inpatient  Remains inpatient appropriate because:Full comfort care.   Dispo: The patient is from: Home              Anticipated d/c is to: hospital death  Patient currently is not medically stable to d/c.   Difficult to place patient  No  Consultants:  Neurology  Neurosurgery  Palliative care  Procedures:  Lumbar puncture by neurology on 5/ 9  EEG 5/ 8  Anti-infectives:  . None  Subjective: Patient was seen and examined and present.  Eyes open but unresponsive.  Appears comfortable  Objective: Vitals:   04/11/21 1948 04/12/21 0828  BP: (!) 161/78 (!) 156/88  Pulse: (!) 117 (!) 124  Resp: (!) 22 18  Temp: 97.7 F (36.5 C) 98.1 F (36.7 C)  SpO2: 97% 100%    Intake/Output Summary (Last 24 hours) at 04/12/2021 1346 Last data filed at 04/11/2021 1808 Gross per 24 hour  Intake --  Output 475 ml  Net -475 ml   Filed Weights   04/03/21 0424 04/05/21 0500 04/07/21 0613  Weight: 62.3 kg 58.2 kg 56.6 kg   Body mass index is 18.97 kg/m.   Physical Exam:  General: Thinly built, not in obvious distress, appears comfortable,  opens eyes.  Extremely ill and deconditioned.  Lethargic but comfortable HENT:   No scleral pallor or icterus noted. Oral mucosa is dry Chest:  Clear breath sounds.  Diminished breath sounds bilaterally.  CVS: S1 &S2 heard. No murmur.  Regular rate and rhythm. Abdomen: Soft, nontender, nondistended.  Bowel sounds are heard.   Extremities: No cyanosis, clubbing or edema.  Peripheral pulses are palpable. Psych: Lethargic CNS: Lethargic Skin: Warm and dry.  No rashes noted.  Data Review: I have personally reviewed the following laboratory data and studies,  CBC: Recent Labs  Lab 04/06/21 0451  WBC 19.1*  HGB 14.5  HCT 43.2  MCV 96.9  PLT 616   Basic Metabolic Panel: Recent Labs  Lab 04/06/21 0451 04/06/21 1148 04/07/21 0432  NA 136  --  138  K 5.2* 5.4* 4.6  CL 102  --  102  CO2 30  --  30  GLUCOSE 298*  --  258*  BUN 28*  --  42*  CREATININE 0.91  --  1.01  CALCIUM 10.0  --  10.4*  MG 2.3  --   --    Liver Function Tests: No results for input(s): AST, ALT, ALKPHOS, BILITOT, PROT, ALBUMIN in the last 168 hours. No results for input(s): LIPASE, AMYLASE in  the last 168 hours. No results for input(s): AMMONIA in the last 168 hours. Cardiac Enzymes: No results for input(s): CKTOTAL, CKMB, CKMBINDEX, TROPONINI in the last 168 hours. BNP (last 3 results) No results for input(s): BNP in the last 8760 hours.  ProBNP (last 3 results) No results for input(s): PROBNP in the last 8760 hours.  CBG: Recent Labs  Lab 04/06/21 1604 04/06/21 1955 04/06/21 2341 04/07/21 0331 04/07/21 0810  GLUCAP 235* 226* 255* 245* 244*   No results found for this or any previous visit (from the past 240 hour(s)).   Studies: No results found.    Flora Lipps, MD  [facility] 04/12/2021  If 7PM-7AM, please contact night-coverage

## 2021-04-22 NOTE — Death Summary Note (Addendum)
DEATH SUMMARY   Patient Details  Name: Robert Gross MRN: [id number] DOB: 12-16-1928  Admission/Discharge Information   Admit Date:  03-30-21  Date of Death: Date of Death: 04/16/21  Time of Death: Time of Death: 0505  Length of Stay: 02-09-2023  Referring Physician: [name], MD   Reason(s) for Hospitalization  Altered mental status  Diagnoses  Preliminary cause of death:  Acute metabolic encephalopathy  Secondary Diagnoses (including complications and co-morbidities):  Principal Problem:   Acute metabolic encephalopathy Active Problems:   Essential hypertension   Diabetes mellitus without complication (HCC)   UTI (urinary tract infection)   Communicating hydrocephalus (HCC)   Hypercalcemia   Hypernatremia   History of CVA (cerebrovascular accident)   End of life care   S/P VP shunt   Brief Hospital Course (including significant findings, care, treatment, and services provided and events leading to death)   Patient is a 87 year old male history of communicating hydrocephalus s/p VP shunt, family reported dementia, hypertension, DM2, BPH, GERD, recent CVA, hyperlipidemia, presented to the the hospital with worsening confusion weakness and poor oral intake.  Patient had been on antibiotic and 2 weeks of prednisone before coming to the hospital for ear infection.  After the prednisone was stopped, he had decline in his health and patient had progressive weakness.  CT head scan showed new subacute infarct on 02/24/2021.  Patient was seen by neurosurgery and the patient was readjusted on 03/24/2021.  Patient progressively worsened and had difficulty ambulation.  Patient was initially admitted to the hospital for acute metabolic encephalopathy, hyponatremia, hypocalcemia, dehydration and suspected UTI with neck stiffness and concern for possible meningitis.  Empiric antibiotics were initiated and the patient was admitted to the hospital.  Neurology and neurosurgery were consulted.   Patient underwent lumbar puncture on 03/30/2021 but continues to have ongoing metabolic encephalopathy and unsafe oral intake.  Family initially opted for feeding tube placement and tube feeds.  Subsequently goals of care were changed and patient was initiated on comfort care.    Following conditions were addressed during hospitalization.  Acute metabolic encephalopathy Continued to decline so comfort care was initiated.     Folic acid deficiency Folate level noted to be 4.0.   Leukocytosis, sepsis ruled out  Chest x-ray did not show any overt pneumonia.   Neck rigidity/possible torticollis LP was negative for meningitis.  History of communicating hydrocephalus s/p VP shunt Patient had recently been seen by neurosurgery for bilateral subdural fluid with need for adjustment of his shunt valve pressure. His subdural collections essentially resolved but he continues to have mild ventriculomegaly. Shunt valve was adjusted again on 5/11 to maximize flow.  AKI, mild Latest creatinine was 0.8.   Dehydration with hypernatremia: Improved after hydration during hospitalization.  Subsequently was on comfort care  Hypercalcemia Noted to have serum calcium of 11.5 on presentation.    Hypokalemia/hypomagnesemia/hypophosphatemia Replenished during hospitalization.  Subsequently, comfort care was pursued  Dysphagia Unstable oral intake due to altered mental status.  Was transitioned to comfort care.  Type II DM No further monitoring was performed.  Questionable CVA  CT head 03/16/2021: Stable appearance of hypodensity seen in the left anterior basal ganglia consistent with subacute infarction. MRI brain showed subacute to chronic infarct.   Essential hypertension   Not on antihypertensives for comfort  Thrombocytopenia: Unclear etiology.   Adult failure to thrive Likely multifactorial.   Urinary retention Had indwelling Foley catheter which was removed.     Goals of care   Palliative care  was consulted and patient was transitioned on comfort care    Pertinent Labs and Studies  Significant Diagnostic Studies DG Chest 2 View  Result Date: 04/15/2021 CLINICAL DATA:  Cough. EXAM: CHEST - 2 VIEW COMPARISON:  June 12, 2020 FINDINGS: Mildly decreased lung volumes are seen which is likely, in part, secondary to the degree of patient inspiration. There is no evidence of acute infiltrate, pleural effusion or pneumothorax. Mild elevation of the right hemidiaphragm is noted. Ventriculoperitoneal catheter tubing is seen overlying the right lung. The heart size and mediastinal contours are within normal limits. There is moderate severity calcification of the thoracic aorta. Multiple soft tissue calcifications are seen projecting over the right upper quadrant. The visualized skeletal structures are unremarkable. IMPRESSION: 1. No active cardiopulmonary disease. 2. Cholelithiasis. Electronically Signed   By: Virgina Norfolk M.D.   On: 03/23/2021 15:12   CT Head Wo Contrast  Result Date: 04/17/2021 CLINICAL DATA:  Mental status changes of unknown etiology, history hypertension, hydrocephalus, type II diabetes mellitus, former smoker EXAM: CT HEAD WITHOUT CONTRAST TECHNIQUE: Contiguous axial images were obtained from the base of the skull through the vertex without intravenous contrast. Sagittal and coronal MPR images reconstructed from axial data set. COMPARISON:  03/16/2021 FINDINGS: Brain: Intracranial shunt via of posterior RIGHT parietal approach with tip at the RIGHT foramen of Monro. Generalized atrophy. Prominent lateral ventricles, slightly greater on LEFT, minimally increased. No midline shift or mass effect. Small vessel chronic ischemic changes of deep cerebral white matter. Cough no intracranial hemorrhage, mass lesion, or evidence of acute infarction. No extra-axial fluid collections. Vascular: Atherosclerotic calcification of internal carotid and  vertebral arteries at skull base. Skull: Intact Sinuses/Orbits: Small chronic LEFT mastoid effusion. Prior partial LEFT mastoidectomy. Small amount of mucus within sphenoid sinus. Paranasal sinuses otherwise clear. Other: N/A IMPRESSION: Mildly dilated lateral ventricles slightly greater on LEFT, minimally increased from previous exam Atrophy with small vessel chronic ischemic changes of deep cerebral white matter. No additional intracranial abnormalities. Electronically Signed   By: Lavonia Dana M.D.   On: 04/01/2021 17:15   CT HEAD WO CONTRAST  Result Date: 03/17/2021 CLINICAL DATA:  Nonverbal. History of hydrocephalus and ventriculoperitoneal shunt placement. EXAM: CT HEAD WITHOUT CONTRAST TECHNIQUE: Contiguous axial images were obtained from the base of the skull through the vertex without intravenous contrast. COMPARISON:  February 24, 2021. FINDINGS: Brain: Right posterior parietal ventriculostomy catheter is again with distal tip just right of midline and right lateral ventricle. Stable mild ventricular enlargement is noted compared to prior exam. Mild diffuse cortical atrophy is noted. Mild chronic ischemic white matter disease is noted. No hemorrhage is noted. Grossly stable hypodensity seen in the left anterior basal ganglia consistent with subacute infarction. Vascular: No hyperdense vessel or unexpected calcification. Skull: Normal. Negative for fracture or focal lesion. Sinuses/Orbits: No acute finding. Other: None. IMPRESSION: Stable position of right posterior parietal ventriculostomy catheter. Stable mild ventricular enlargement is noted compared to prior exam. Stable appearance of hypodensity seen in the left anterior basal ganglia consistent with subacute infarction. Electronically Signed   By: Marijo Conception M.D.   On: 03/17/2021 13:15   MR BRAIN WO CONTRAST  Result Date: 04/01/2021 CLINICAL DATA:  Shunted hydrocephalus, altered mental status EXAM: MRI HEAD WITHOUT CONTRAST TECHNIQUE:  Multiplanar, multiecho pulse sequences of the brain and surrounding structures were obtained without intravenous contrast. COMPARISON:  October 2018 FINDINGS: There is susceptibility artifact from shunt apparatus. Adjacent parenchyma is variably obscured or distorted depending on sequence. Findings below are  within this limitation. Brain: Small area of mildly reduced diffusion involving the left caudate head. There is susceptibility hypointensity in this region likely reflecting chronic blood products and diffusion hyperintensity is probably artifactual. Right posterior approach shunt catheter enters the right lateral ventricle with tip within the body along the septum pellucidum. Ventricle caliber has minimally increased compared to 2018. There are thin bilateral subdural collections measuring up to 4-5 mm in thickness bilaterally. No mass effect. Patchy and confluent areas of T2 hyperintensity in the supratentorial white matter are nonspecific but probably reflect mild to moderate chronic microvascular ischemic changes. Prominence of the ventricles and sulci reflects generalized parenchymal volume loss. There is no intracranial mass, mass effect, or edema. Vascular: Major vessel flow voids at the skull base are preserved. Skull and upper cervical spine: Normal marrow signal is preserved. Sinuses/Orbits: Mild mucosal thickening. Bilateral lens replacements. Other: Sella is unremarkable. Persistent mastoid effusions. Decrease in size of small T2 hyperintense right parotid lesion. IMPRESSION: No acute infarction. Subacute to chronic infarct of the left caudate head with chronic blood products. Thin bilateral subdural collections without mass effect. Correlate with recent change in shunt settings. Ventricle caliber is minimally increased compared to 2018 MRI. Chronic microvascular ischemic changes. Electronically Signed   By: Macy Mis M.D.   On: 04/01/2021 12:52   DG CHEST PORT 1 VIEW  Result Date:  04/06/2021 CLINICAL DATA:  Dyspnea EXAM: PORTABLE CHEST 1 VIEW COMPARISON:  04/16/2021 FINDINGS: Cardiac shadow is stable. Aortic calcifications are again noted. Feeding catheter extends into the stomach. Ventriculostomy catheter is noted in the right chest wall. The lungs are well aerated bilaterally with mild left basilar atelectasis. No sizable effusion is seen. No bony abnormality is noted. IMPRESSION: Mild left basilar atelectasis. Electronically Signed   By: Inez Catalina M.D.   On: 04/06/2021 19:49   DG Naso G Tube Plc W/Fl W/Rad  Result Date: 03/31/2021 CLINICAL DATA:  Feeding tube placement EXAM: NASO G TUBE PLACEMENT WITH FL AND WITH RAD FLUOROSCOPY TIME:  Fluoroscopy Time:  3 minutes 24 seconds Radiation Exposure Index (if provided by the fluoroscopic device): 43 mGy Number of Acquired Spot Images: 0 COMPARISON:  None. FINDINGS: Feeding tube was placed under fluoroscopic guidance. The tip was advanced into the descending duodenum. IMPRESSION: Transpyloric feeding tube placed with the tip in the descending duodenum. Electronically Signed   By: Rolm Baptise M.D.   On: 03/31/2021 16:47   DG Swallowing Func-Speech Pathology  Result Date: 03/30/2021 Objective Swallowing Evaluation: Type of Study: MBS-Modified Barium Swallow Study  Patient Details Name: CHANSE KAGEL MRN: [id number] Date of Birth: 04/10/1929 Today's Date: 03/30/2021 Time: SLP Start Time (ACUTE ONLY): 1226 -SLP Stop Time (ACUTE ONLY): 1250 SLP Time Calculation (min) (ACUTE ONLY): 24 min Past Medical History: Past Medical History: Diagnosis Date . Arthritis  . BPH (benign prostatic hyperplasia)  . BPH with urinary obstruction  . Cataracts, bilateral  . Dizzy spells   residual from concussion 09-05-2017 intermittantly when turns head but stated as of 09-26-2017 no issues for past 2-3 days . Essential hypertension  . Gait abnormality  . GERD (gastroesophageal reflux disease)  . History of concussion   09-05-2017  w/ loc --- per pt residual  intermittant dizziness when he turns his head either way . History of prostatitis  . History of sepsis   09-12-2017  urosepsis . History of urinary retention  . Hydrocephalus ([place])  . Hypercalcemia  . Hyperlipemia  . Memory loss  . Metabolic encephalopathy  . Osteopenia  .  Squamous cell carcinoma in situ   multiple spots . Type 2 diabetes mellitus ([place])  . Urinary retention   requires intermittent caths . Vitamin D deficiency  . Vocal cord paralysis  . Wears partial dentures   upper Past Surgical History: Past Surgical History: Procedure Laterality Date . CATARACT EXTRACTION W/ INTRAOCULAR LENS  IMPLANT, BILATERAL  03/2017 . EXICISION EPIDERMAL CYST LEFT THUMB  09-12-2001   dr sypher  [facility] . INGUINAL HERNIA REPAIR Right 11-07-2007   dr Dalbert [facility] . KNEE ARTHROSCOPY Right 1990s . MIDDLE EAR SURGERY    cyst removal . SQUAMOUS CELL CARCINOMA EXCISION   . TRANSURETHRAL RESECTION OF PROSTATE  07-26-2000  dr Amalia Ha[facility] . TRANSURETHRAL RESECTION OF PROSTATE N/A 09/28/2017  Procedure: BIPOLAR TRANSURETHRAL RESECTION OF THE PROSTATE (TURP);  Surgeon: Lucas Mallow, MD;  Location: [facility];  Service: Urology;  Laterality: N/A; . VENTRICULOPERITONEAL SHUNT Right 04/08/2020  Procedure: Shunt Placment - right occipital;  Surgeon: Earnie Larsson, MD;  Location: [place];  Service: Neurosurgery;  Laterality: Right; HPI: 87 year old male with history of communicating hydrocephalus status post VP shunt, hypertension, diabetes mellitus type 2, BPH, GERD, recent CVA, hyperlipidemia presented with worsening confusion and weakness and poor p.o. intake.  Patient was recently treated for otitis media by his PCP with Omnicef and prednisone for 2 weeks.  Since then patient has gradually declined with poor p.o. intake. Surgery 03/24/21 for VP shut pressure w/o improvement.  LP pending to r/o meningitis.  Subjective: Pt awake/alert Assessment / Plan / Recommendation CHL IP CLINICAL IMPRESSIONS 03/30/2021 Clinical Impression Pt  presents with a severe oropharyngeal dysphagia c/b incomplete labial seal, decreased lingual strength and coordination, reduced base of tongue retraction, delayed swallow inititiation, incomplete laryngeal closure, reduced paharyngeal constriction, decreased hyloaryngeal excursion, and diminished sensation.  These deficits resulted in penetration and/or aspiration of all consistencies trialed.  With thin liquid there was signifcant anterior spillage with the majority of the bolus lost.  With the fraction that did reach the pharynx there was penetration reaching the level of the vocal folds which did not clear with spontaneous throat clear.  Pt did no follow commands for throat clear or reswallow.  With puree there was oral holding. Larger bite increased bolus awareness and allowed for oral transit.  There was lingual pumping and discoordination.  With swallow there was minimal pharyngeal clearance of bolus with a significant amount of bolus remaining in pharynx.  Pt did not follow cues for cleansing swallow, nor were spontaneous secondary swallows observed.  Liquid was was used in an attempt to reduce residue, which still did not fully clear.  Residuals are suspected to have mixed with subsequent trials and penetrated oral cavity with possible aspiration. With nectar thick liquid there was frank, gross aspiration during the swallow. Reflexive cough/throat clear was very weak and ineffective to clear aspiration.  Pt did not follow cues to cough or reswallow.  With honey thick liquid there was silent penetration to the level of the vocal folds with subsequent aspiration.  Of note, there was retained contrast throughout esophagus on esophageal sweep following trial of HTL.  Pt may have esophagel dysmotility as well as oropharyngeal dysphagia.  Consider GI consult or esophageal evaluation as indicated.  Of note: pt appears to have possible cervical osteophytes near the UES opening, which did not prevent, but may have  impaired, bolus flow into esophagus, especially with more viscous consistencies.  Pt is not safe for any oral diet at this time.  Recommend  pt remain NPO with temporary alternate means of nutrition at present. Daughter was present for swallow evaluation.  Discussed results and recommendations following MBSS, role of SLP in dysphagia management, and alternate means of nutrition.  Pt has been NPO since Friday and she is concerned that not being able to eat is making him weaker. Daughter stated that PEG tube would not be desired, but was willing to consider non-surgical NG tube placement. Briefly discussed possibility of comfort feedings with known risk of aspiration. Consider Cortrak placement pending results of ongoing diagnostic testing. SLP Visit Diagnosis Dysphagia, oropharyngeal phase (R13.12) Attention and concentration deficit following -- Frontal lobe and executive function deficit following -- Impact on safety and function Severe aspiration risk;Risk for inadequate nutrition/hydration   CHL IP TREATMENT RECOMMENDATION 03/30/2021 Treatment Recommendations Therapy as outlined in treatment plan below   Prognosis 03/30/2021 Prognosis for Safe Diet Advancement Guarded Barriers to Reach Goals Severity of deficits Barriers/Prognosis Comment -- CHL IP DIET RECOMMENDATION 03/30/2021 SLP Diet Recommendations NPO;Alternative means - temporary Liquid Administration via -- Medication Administration Via alternative means Compensations -- Postural Changes --   CHL IP OTHER RECOMMENDATIONS 03/28/2021 Recommended Consults -- Oral Care Recommendations -- Other Recommendations Have oral suction available   CHL IP FOLLOW UP RECOMMENDATIONS 03/30/2021 Follow up Recommendations (No Data)   CHL IP FREQUENCY AND DURATION 03/30/2021 Speech Therapy Frequency (ACUTE ONLY) min 1 x/week Treatment Duration 1 week      CHL IP ORAL PHASE 03/30/2021 Oral Phase Impaired Oral - Pudding Teaspoon -- Oral - Pudding Cup -- Oral - Honey Teaspoon -- Oral - Honey  Cup Premature spillage;Decreased bolus cohesion;Left anterior bolus loss;Right anterior bolus loss;Incomplete tongue to palate contact;Weak lingual manipulation Oral - Nectar Teaspoon -- Oral - Nectar Cup Premature spillage;Decreased bolus cohesion;Incomplete tongue to palate contact;Weak lingual manipulation Oral - Nectar Straw -- Oral - Thin Teaspoon -- Oral - Thin Cup Premature spillage;Decreased bolus cohesion;Left anterior bolus loss;Incomplete tongue to palate contact;Weak lingual manipulation Oral - Thin Straw -- Oral - Puree Weak lingual manipulation;Lingual pumping;Incomplete tongue to palate contact;Reduced posterior propulsion;Holding of bolus;Lingual/palatal residue;Piecemeal swallowing;Delayed oral transit;Decreased bolus cohesion Oral - Mech Soft -- Oral - Regular -- Oral - Multi-Consistency -- Oral - Pill -- Oral Phase - Comment --  CHL IP PHARYNGEAL PHASE 03/30/2021 Pharyngeal Phase Impaired Pharyngeal- Pudding Teaspoon -- Pharyngeal -- Pharyngeal- Pudding Cup -- Pharyngeal -- Pharyngeal- Honey Teaspoon -- Pharyngeal -- Pharyngeal- Honey Cup Delayed swallow initiation-pyriform sinuses;Reduced anterior laryngeal mobility;Reduced laryngeal elevation;Reduced airway/laryngeal closure;Reduced tongue base retraction;Penetration/Aspiration during swallow;Penetration/Apiration after swallow;Trace aspiration;Pharyngeal residue - valleculae;Pharyngeal residue - pyriform Pharyngeal Material enters airway, passes BELOW cords without attempt by patient to eject out (silent aspiration) Pharyngeal- Nectar Teaspoon -- Pharyngeal -- Pharyngeal- Nectar Cup Delayed swallow initiation-pyriform sinuses;Reduced pharyngeal peristalsis;Reduced anterior laryngeal mobility;Reduced laryngeal elevation;Reduced airway/laryngeal closure;Reduced tongue base retraction;Penetration/Aspiration before swallow;Significant aspiration (Amount);Pharyngeal residue - valleculae;Pharyngeal residue - pyriform Pharyngeal Material enters airway,  passes BELOW cords and not ejected out despite cough attempt by patient Pharyngeal- Nectar Straw -- Pharyngeal -- Pharyngeal- Thin Teaspoon -- Pharyngeal -- Pharyngeal- Thin Cup Delayed swallow initiation-pyriform sinuses;Reduced anterior laryngeal mobility;Reduced laryngeal elevation;Reduced airway/laryngeal closure;Reduced tongue base retraction;Penetration/Aspiration during swallow;Penetration/Aspiration before swallow;Pharyngeal residue - valleculae;Pharyngeal residue - pyriform Pharyngeal Material enters airway, CONTACTS cords and not ejected out Pharyngeal- Thin Straw -- Pharyngeal -- Pharyngeal- Puree Delayed swallow initiation-vallecula;Reduced anterior laryngeal mobility;Reduced laryngeal elevation;Reduced airway/laryngeal closure;Reduced tongue base retraction;Penetration/Apiration after swallow;Pharyngeal residue - valleculae;Pharyngeal residue - pyriform;Pharyngeal residue - posterior pharnyx;Pharyngeal residue - cp segment;Inter-arytenoid space residue;Lateral channel residue Pharyngeal Material enters  airway, remains ABOVE vocal cords and not ejected out Pharyngeal- Mechanical Soft -- Pharyngeal -- Pharyngeal- Regular -- Pharyngeal -- Pharyngeal- Multi-consistency -- Pharyngeal -- Pharyngeal- Pill -- Pharyngeal -- Pharyngeal Comment --  CHL IP CERVICAL ESOPHAGEAL PHASE 03/30/2021 Cervical Esophageal Phase Impaired Pudding Teaspoon -- Pudding Cup -- Honey Teaspoon -- Honey Cup -- Nectar Teaspoon -- Nectar Cup -- Nectar Straw -- Thin Teaspoon -- Thin Cup -- Thin Straw -- Puree -- Mechanical Soft -- Regular -- Multi-consistency -- Pill -- Cervical Esophageal Comment Retention of contrast in the esophagus without backflow to pharynx Celedonio Savage, MA, [place] Office: [phone] 03/30/2021, 1:40 PM              EEG adult  Result Date: 03/29/2021 Lora Havens, MD     03/29/2021 11:17 AM Patient Name: AYVIN LIPINSKI MRN: [id number] Epilepsy Attending: Lora Havens  Referring Physician/Provider: Dr Berle Mull Date: 03/29/2021 Duration: 26.41 mins Patient history: 87yo M with ams. EEG to evaluate for seizure. Level of alertness: Awake, asleep AEDs during EEG study: None Technical aspects: This EEG study was done with scalp electrodes positioned according to the 10-20 International system of electrode placement. Electrical activity was acquired at a sampling rate of 500Hz  and reviewed with a high frequency filter of 70Hz  and a low frequency filter of 1Hz . EEG data were recorded continuously and digitally stored. Description: No posterior dominant rhythm was seen. Sleep was characterized by sleep spindles (12 to 14 Hz), maximal frontocentral region. EEG showed continuous generalized 3 to 6 Hz theta-delta slowing. Hyperventilation and photic stimulation were not performed.   ABNORMALITY - Continuous slow, generalized IMPRESSION: This study is suggestive of moderate diffuse encephalopathy, nonspecific etiology. No seizures or epileptiform discharges were seen throughout the recording. Lora Havens    Microbiology No results found for this or any previous visit (from the past 240 hour(s)).  Lab Basic Metabolic Panel: No results for input(s): NA, K, CL, CO2, GLUCOSE, BUN, CREATININE, CALCIUM, MG, PHOS in the last 168 hours. Liver Function Tests: No results for input(s): AST, ALT, ALKPHOS, BILITOT, PROT, ALBUMIN in the last 168 hours. No results for input(s): LIPASE, AMYLASE in the last 168 hours. No results for input(s): AMMONIA in the last 168 hours. CBC: No results for input(s): WBC, NEUTROABS, HGB, HCT, MCV, PLT in the last 168 hours. Cardiac Enzymes: No results for input(s): CKTOTAL, CKMB, CKMBINDEX, TROPONINI in the last 168 hours. Sepsis Labs: No results for input(s): PROCALCITON, WBC, LATICACIDVEN in the last 168 hours.  Procedures/Operations   Lumbar puncture by neurology on 5/ 9  EEG 5/ 8   Sayvon Arterberry 04/14/2021, 3:11 PM

## 2021-04-22 NOTE — Significant Event (Signed)
  Pt found to be pulseless/breathless Time of Death: 0505AM B. Chotiner MD notified and made aware Post mortem care rendered [facility] called and notified  No family present at bedside, pt's daughter [name] called and notified No funeral home arrangements made at this time Transferred to morgue with no belongings at bedside

## 2021-04-22 DEATH — deceased
